# Patient Record
Sex: Female | Born: 1980
Health system: Southern US, Community
[De-identification: ages and names within clinical notes are randomized; demographics above are authoritative.]

## PROBLEM LIST (undated history)

## (undated) DIAGNOSIS — J189 Pneumonia, unspecified organism: Secondary | ICD-10-CM

## (undated) DIAGNOSIS — J8281 Chronic eosinophilic pneumonia: Secondary | ICD-10-CM

## (undated) DIAGNOSIS — R569 Unspecified convulsions: Secondary | ICD-10-CM

## (undated) DIAGNOSIS — A499 Bacterial infection, unspecified: Secondary | ICD-10-CM

## (undated) DIAGNOSIS — D049 Carcinoma in situ of skin, unspecified: Secondary | ICD-10-CM

## (undated) DIAGNOSIS — D649 Anemia, unspecified: Secondary | ICD-10-CM

## (undated) DIAGNOSIS — R0600 Dyspnea, unspecified: Secondary | ICD-10-CM

## (undated) DIAGNOSIS — T8859XA Other complications of anesthesia, initial encounter: Secondary | ICD-10-CM

## (undated) DIAGNOSIS — E039 Hypothyroidism, unspecified: Secondary | ICD-10-CM

## (undated) DIAGNOSIS — Z9889 Other specified postprocedural states: Secondary | ICD-10-CM

## (undated) DIAGNOSIS — K219 Gastro-esophageal reflux disease without esophagitis: Secondary | ICD-10-CM

## (undated) DIAGNOSIS — T4145XA Adverse effect of unspecified anesthetic, initial encounter: Secondary | ICD-10-CM

## (undated) DIAGNOSIS — Z8619 Personal history of other infectious and parasitic diseases: Secondary | ICD-10-CM

## (undated) DIAGNOSIS — Z87898 Personal history of other specified conditions: Secondary | ICD-10-CM

## (undated) DIAGNOSIS — J82 Pulmonary eosinophilia, not elsewhere classified: Principal | ICD-10-CM

## (undated) DIAGNOSIS — R51 Headache: Secondary | ICD-10-CM

## (undated) DIAGNOSIS — R112 Nausea with vomiting, unspecified: Secondary | ICD-10-CM

## (undated) DIAGNOSIS — R519 Headache, unspecified: Secondary | ICD-10-CM

## (undated) DIAGNOSIS — N390 Urinary tract infection, site not specified: Secondary | ICD-10-CM

## (undated) HISTORY — PX: TONSILLECTOMY AND ADENOIDECTOMY: SUR1326

## (undated) HISTORY — DX: Chronic eosinophilic pneumonia: J82.81

## (undated) HISTORY — DX: Hypothyroidism, unspecified: E03.9

## (undated) HISTORY — DX: Personal history of other specified conditions: Z87.898

## (undated) HISTORY — DX: Bacterial infection, unspecified: N39.0

## (undated) HISTORY — DX: Urinary tract infection, site not specified: A49.9

## (undated) HISTORY — PX: DIAGNOSTIC LAPAROSCOPY: SUR761

## (undated) HISTORY — DX: Pulmonary eosinophilia, not elsewhere classified: J82

## (undated) HISTORY — DX: Personal history of other infectious and parasitic diseases: Z86.19

## (undated) HISTORY — DX: Anemia, unspecified: D64.9

---
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
## 2006-09-24 ENCOUNTER — Inpatient Hospital Stay: Payer: Self-pay | Admitting: Obstetrics and Gynecology

## 2006-10-20 DIAGNOSIS — J189 Pneumonia, unspecified organism: Secondary | ICD-10-CM

## 2006-10-20 HISTORY — PX: LUNG BIOPSY: SHX232

## 2006-10-20 HISTORY — DX: Pneumonia, unspecified organism: J18.9

## 2007-10-26 ENCOUNTER — Ambulatory Visit: Payer: Self-pay | Admitting: Internal Medicine

## 2007-11-04 ENCOUNTER — Ambulatory Visit: Payer: Self-pay | Admitting: Internal Medicine

## 2007-11-23 ENCOUNTER — Ambulatory Visit: Payer: Self-pay | Admitting: Thoracic Surgery

## 2007-12-06 ENCOUNTER — Inpatient Hospital Stay (HOSPITAL_COMMUNITY): Admission: RE | Admit: 2007-12-06 | Discharge: 2007-12-09 | Payer: Self-pay | Admitting: Thoracic Surgery

## 2007-12-06 ENCOUNTER — Ambulatory Visit: Payer: Self-pay | Admitting: Internal Medicine

## 2007-12-06 ENCOUNTER — Ambulatory Visit: Payer: Self-pay | Admitting: Thoracic Surgery

## 2007-12-06 ENCOUNTER — Encounter: Payer: Self-pay | Admitting: Thoracic Surgery

## 2007-12-16 ENCOUNTER — Encounter: Admission: RE | Admit: 2007-12-16 | Discharge: 2007-12-16 | Payer: Self-pay | Admitting: Thoracic Surgery

## 2007-12-16 ENCOUNTER — Ambulatory Visit: Payer: Self-pay | Admitting: Thoracic Surgery

## 2008-01-12 ENCOUNTER — Ambulatory Visit: Payer: Self-pay | Admitting: Thoracic Surgery

## 2008-01-12 ENCOUNTER — Encounter: Admission: RE | Admit: 2008-01-12 | Discharge: 2008-01-12 | Payer: Self-pay | Admitting: Thoracic Surgery

## 2008-01-19 ENCOUNTER — Ambulatory Visit: Payer: Self-pay | Admitting: Internal Medicine

## 2008-04-02 IMAGING — CR DG CHEST 2V
2 series · 2 of 2 positions shown · non-contrast
Comparison: Chest, 12/16/07.

CLINICAL DATA: History of VATS on the right.  Follow-up.
 CHEST - 2 VIEW:

[w chest pa]
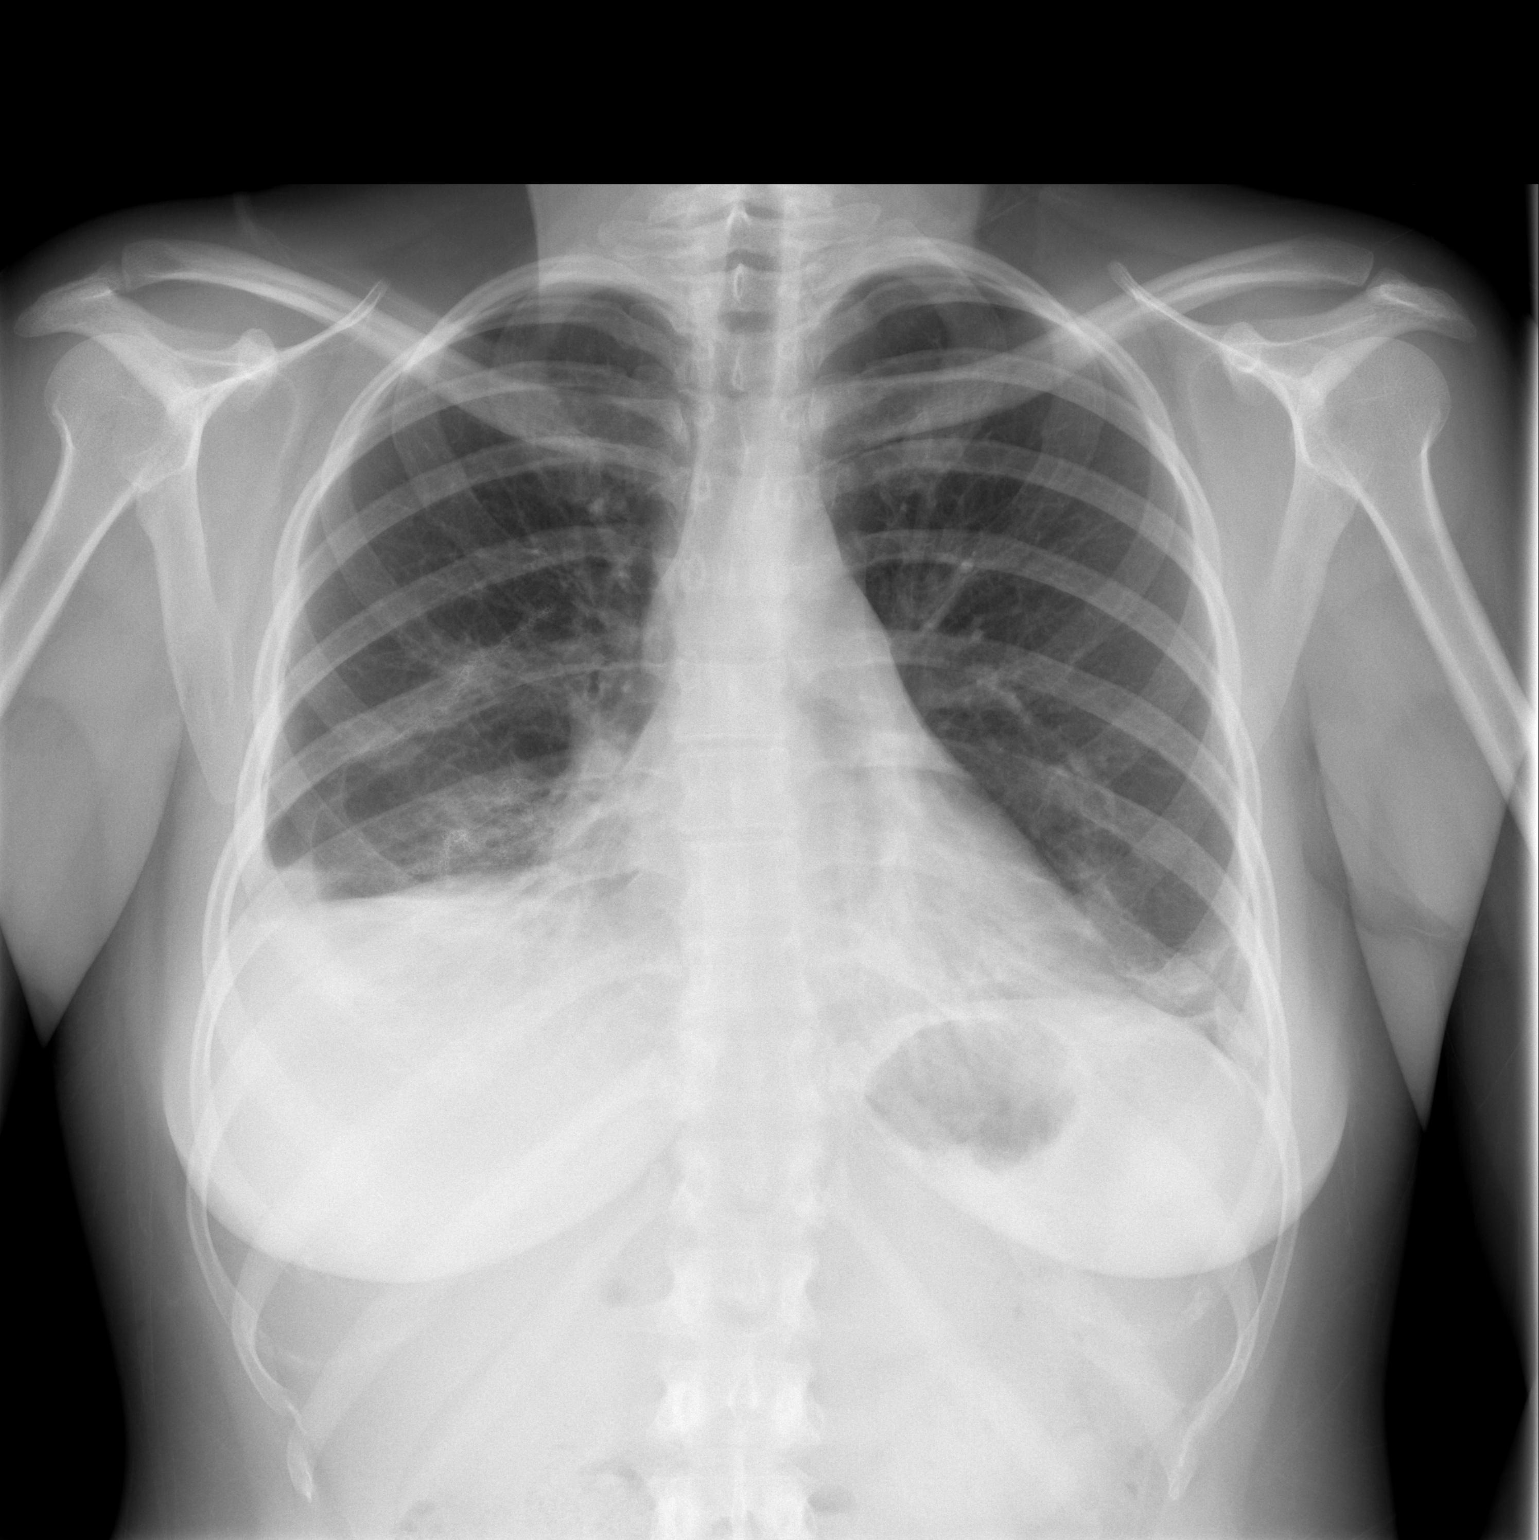

[w chest lat]
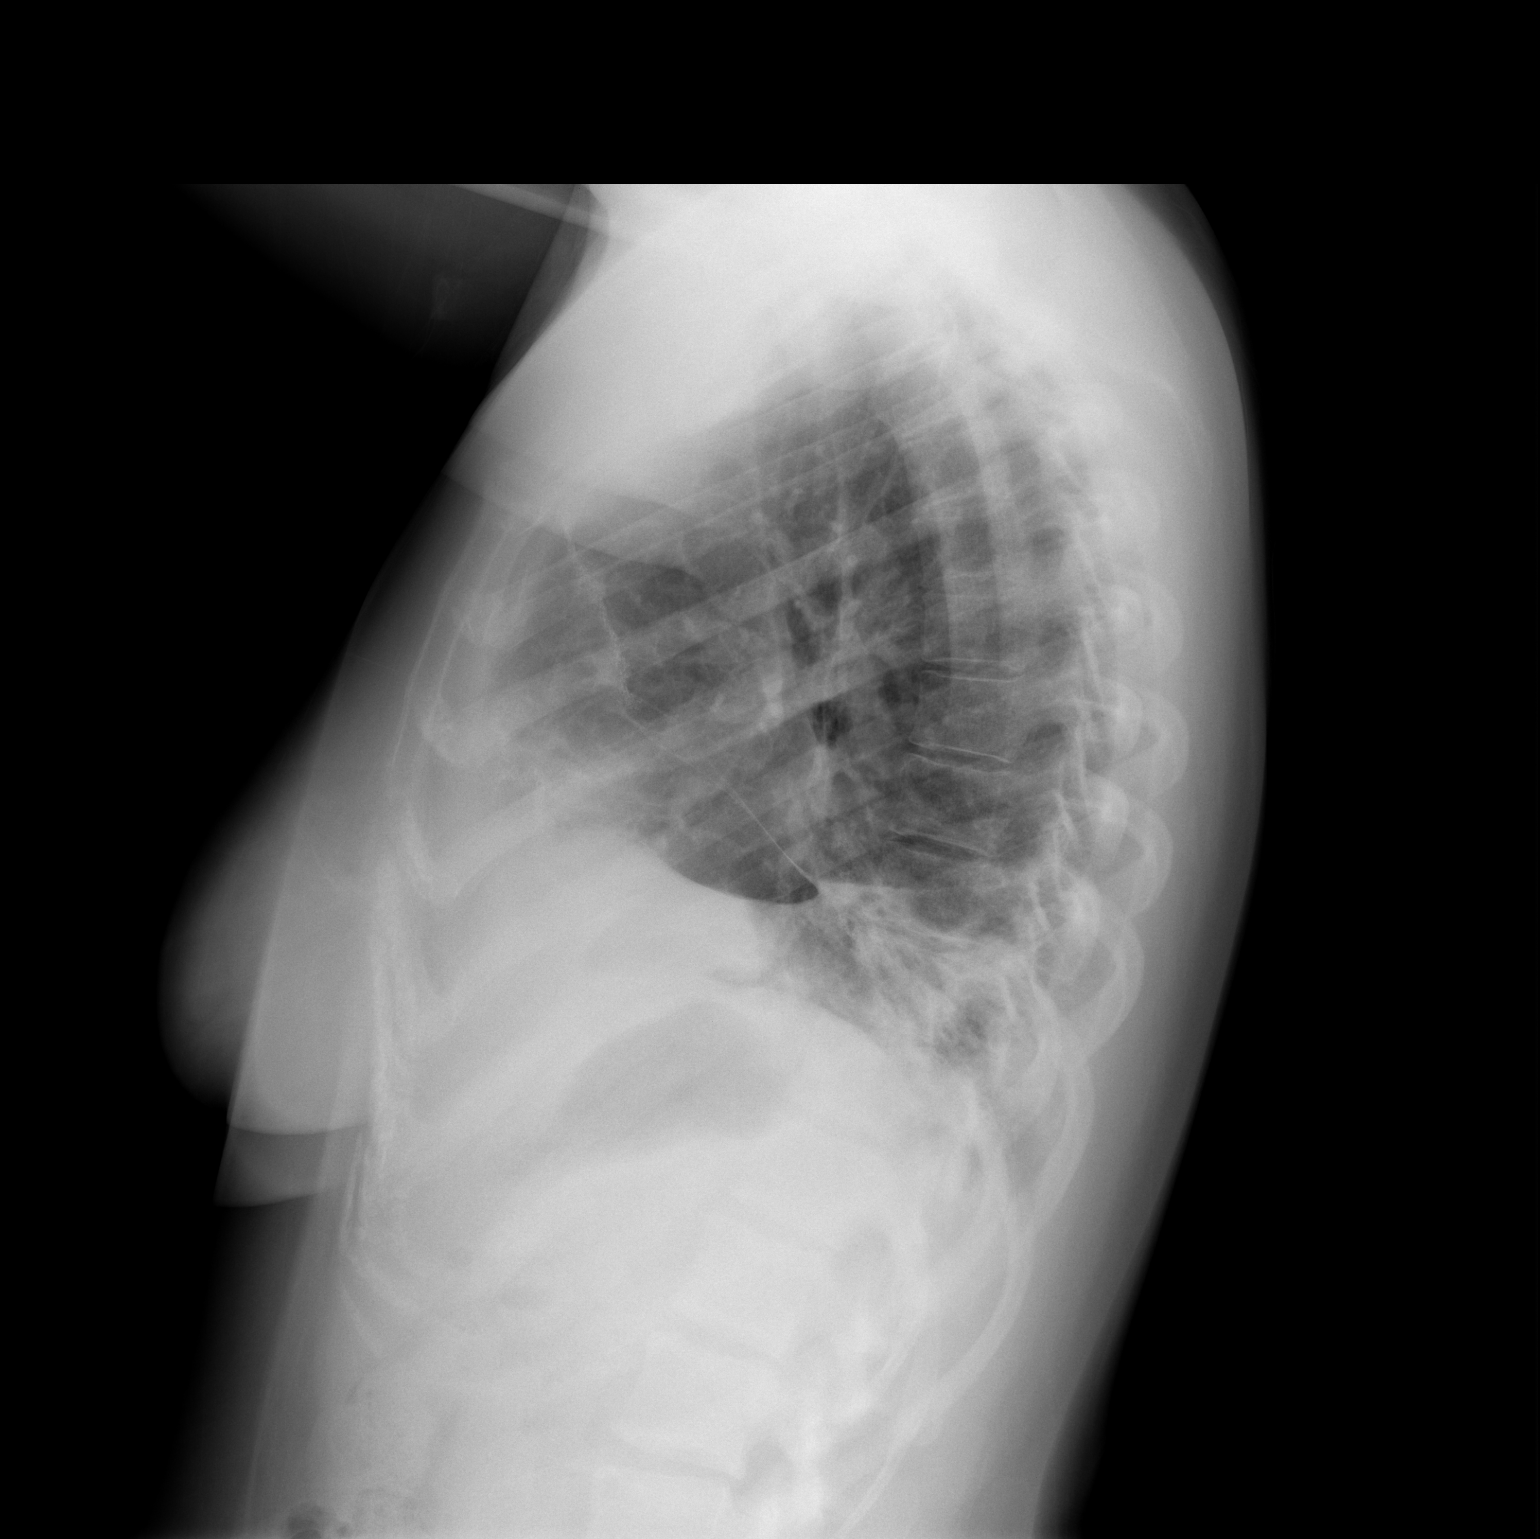

[2 of 2 positions shown; findings below may reference images not displayed]

FINDINGS: The aeration has improved.  There is persistent opacity at the lung bases, right greater than left and a probable small right effusion.  Heart size is stable.
IMPRESSION: Improved aeration.  Persistent basilar opacities, right greater than left with probable small right effusion.

## 2008-04-09 IMAGING — CT CT CHEST W/ CM
1 series · 15 of 32 positions shown, 19 images · non-contrast
Comparison: none

REASON FOR EXAM: Follow-up pneumonia
COMMENTS:

[Series 2: soft tissue · axial · 0.66mm/px · z∈[-103,+147]mm · 15 of 56 slices shown, 19 images]
[im 4/56  soft-tissue]
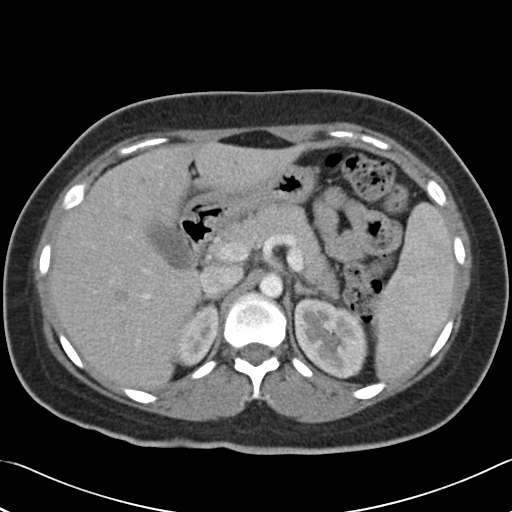
[im 4/56  bone]
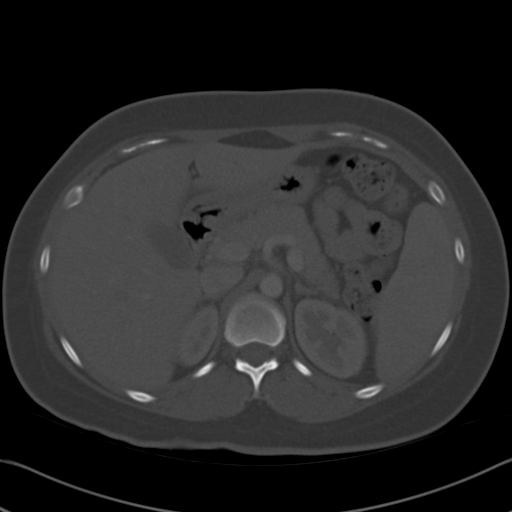
[im 8/56  soft-tissue]
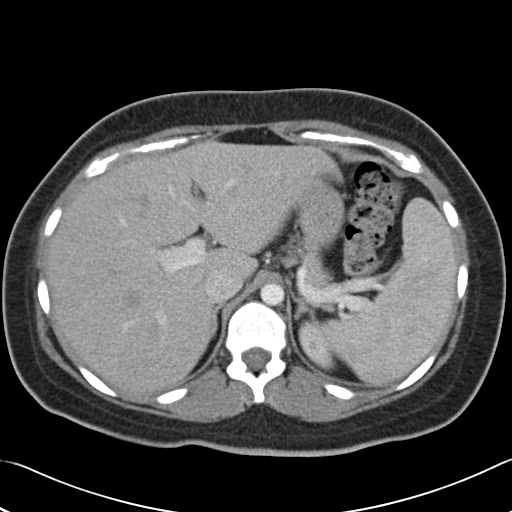
[im 11/56  soft-tissue]
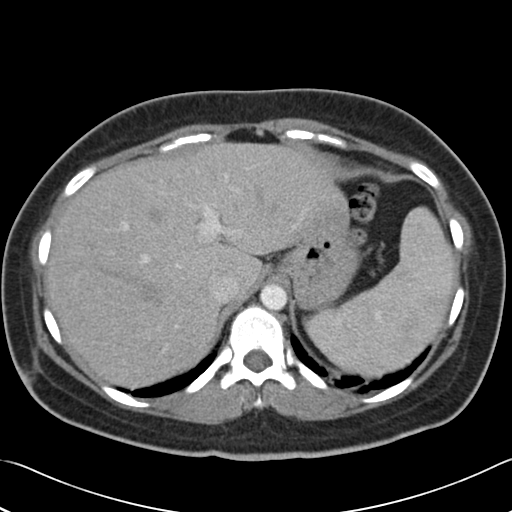
[im 16/56  soft-tissue]
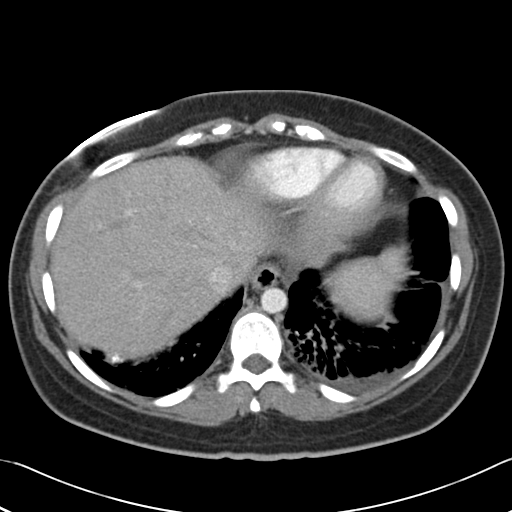
[im 20/56  soft-tissue]
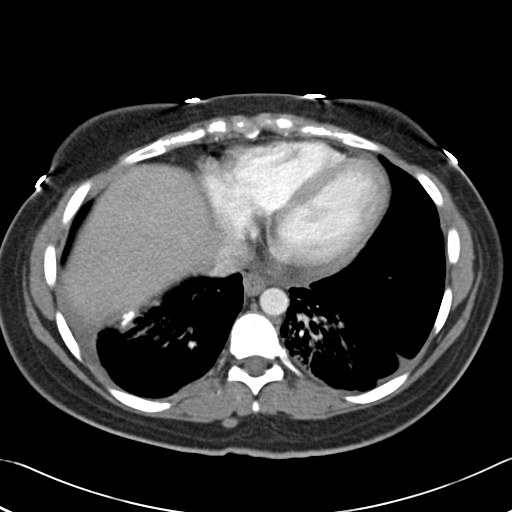
[im 24/56  soft-tissue]
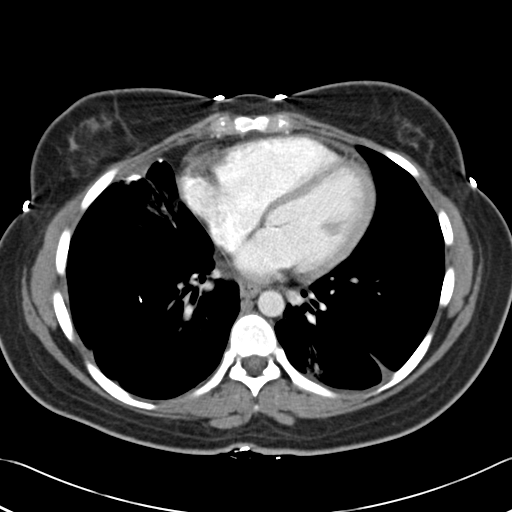
[im 29/56  soft-tissue]
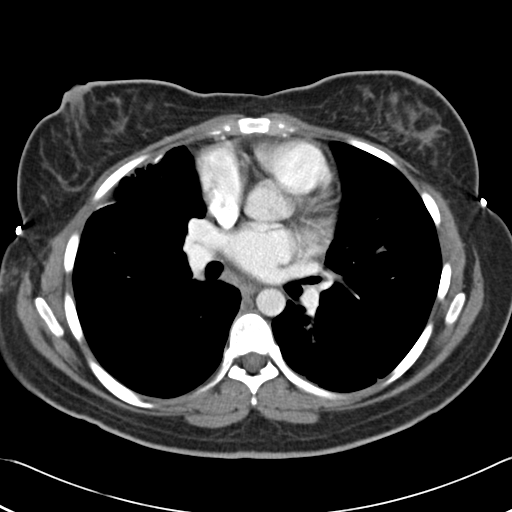
[im 32/56  soft-tissue]
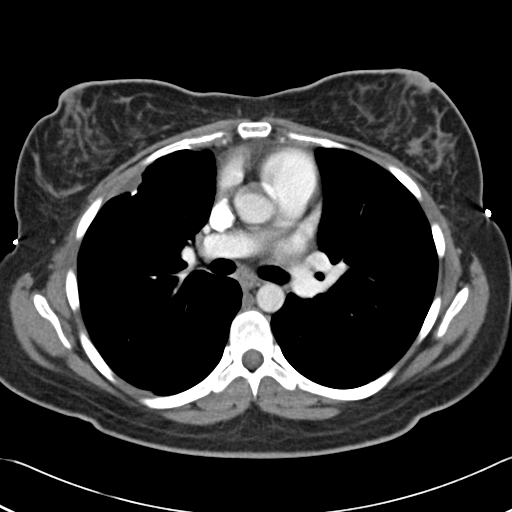
[im 36/56  soft-tissue]
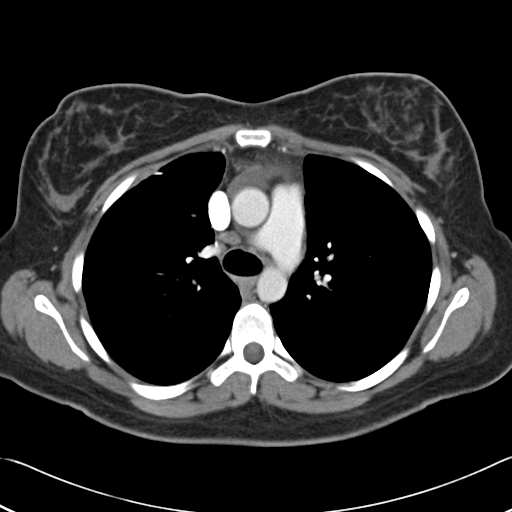
[im 36/56  bone]
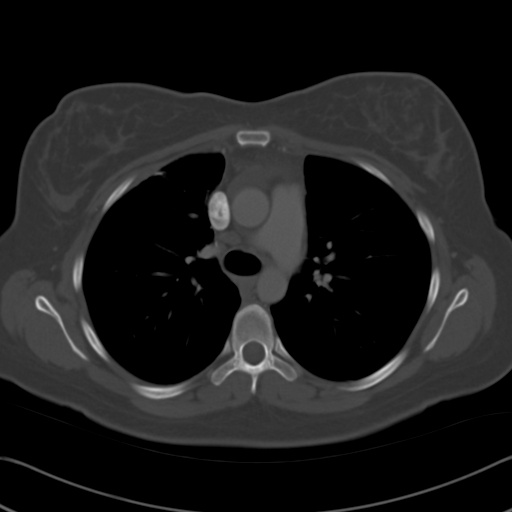
[im 40/56  soft-tissue]
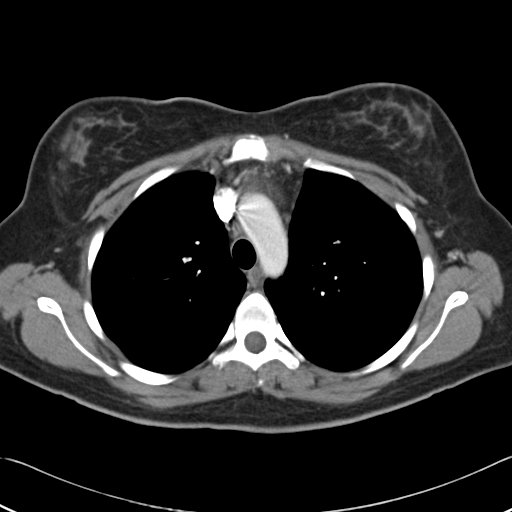
[im 45/56  soft-tissue]
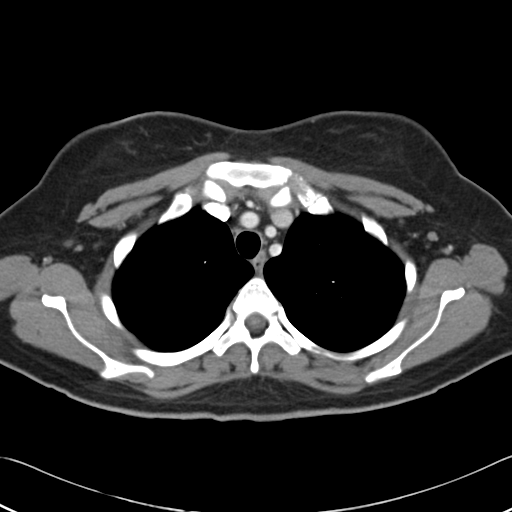
[im 48/56  soft-tissue]
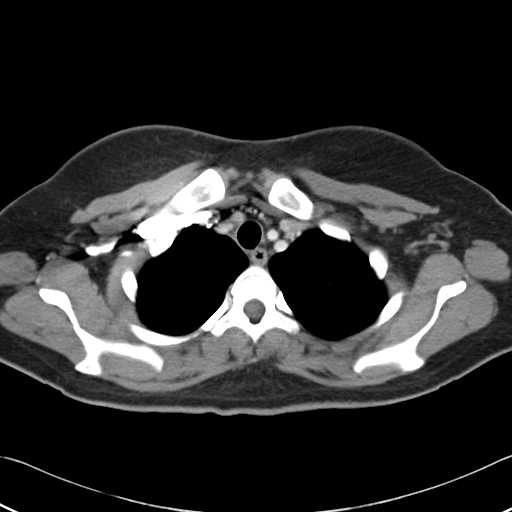
[im 48/56  lung]
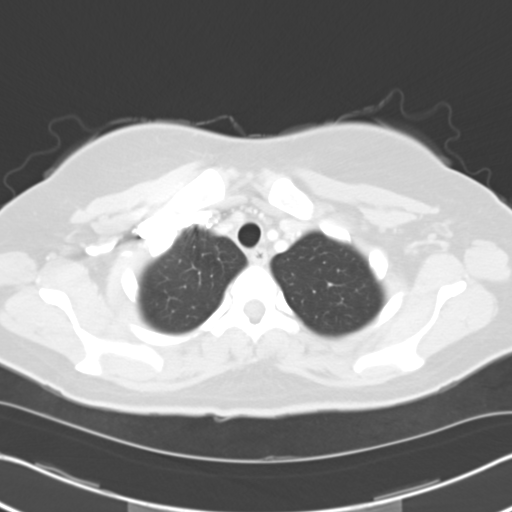
[im 50/56  lung]
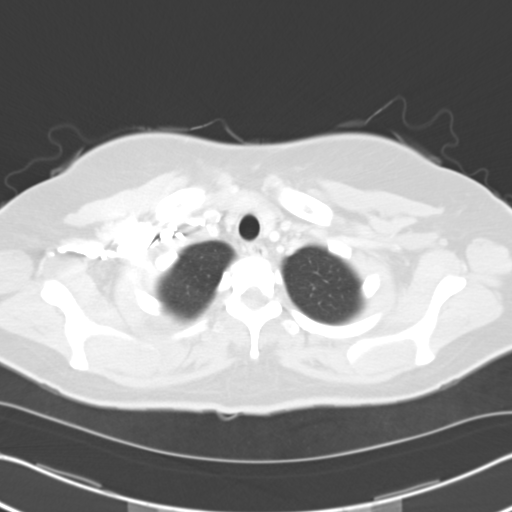
[im 52/56  soft-tissue]
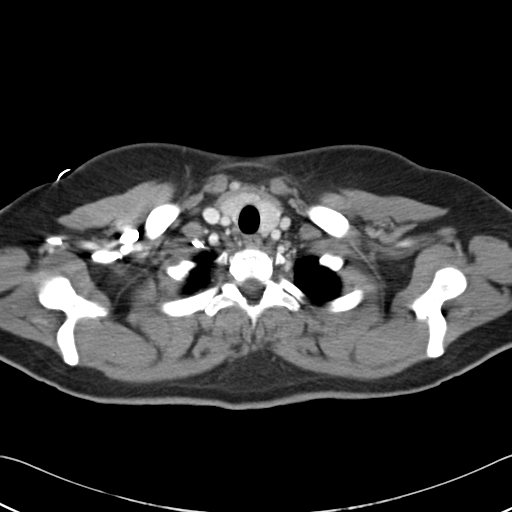
[im 52/56  lung]
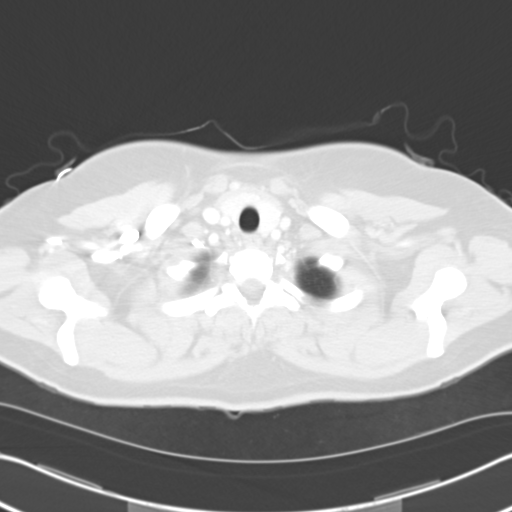
[im 54/56  lung]
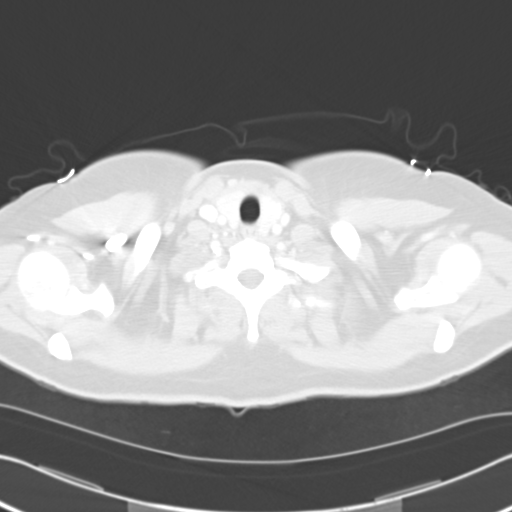

[15 of 32 positions shown; findings below may reference images not displayed]

PROCEDURE:     CT  - CT CHEST WITH CONTRAST  - January 19, 2008  [DATE]

RESULT:     The patient has a history of prior pneumonia.

Comparison is made to a study of 10/26/2007.

There has been some improvement in the appearance of the lower lobe both
anteriorly and posteriorly. While coarse lung markings remain in this
region, the confluent alveolar infiltrate with air bronchograms, seen on the
prior study, have cleared. Similar improvement has occurred on the LEFT.
There remain areas of abnormality which are likely at least in part
reflective of bronchiectasis. Density in the more superior and anterior
aspects of the RIGHT middle lobe has improved as well. I see no new
infiltrates.

The cardiac chambers are normal in size. There is no more than a tiny amount
of pleural fluid versus pleural thickening at the LEFT lung base. This is
not greatly changed from the prior study. Within the mediastinum and hilar
regions, I see no pathologic sized lymph nodes. The caliber of the thoracic
aorta is normal. Within the upper abdomen, the observed portions of the
liver are normal. There is radiolucency in the gallbladder which may reflect
a gas filled stone. The adrenal glands exhibit no abnormality.
IMPRESSION: 1.  There has been improvement in the appearance of both lungs but there
remain significant areas of architectural distortion. This may reflect
fibrosis and bronchiectasis. There is some pleural calcification noted along
the anterior/superior aspect of the RIGHT middle lobe and in the RIGHT lower
lobe. These have appeared since the prior study. I see no evidence of an
active alveolar infiltrate.
2.  There is no evidence of CHF and I see no pathologic sized lymph nodes.
3.  I cannot exclude gallstones.

## 2008-04-25 ENCOUNTER — Ambulatory Visit: Payer: Self-pay | Admitting: Internal Medicine

## 2009-02-09 ENCOUNTER — Ambulatory Visit: Payer: Self-pay | Admitting: Internal Medicine

## 2009-04-18 ENCOUNTER — Ambulatory Visit: Payer: Self-pay | Admitting: Rheumatology

## 2009-05-22 ENCOUNTER — Encounter: Payer: Self-pay | Admitting: Obstetrics & Gynecology

## 2009-05-22 ENCOUNTER — Ambulatory Visit: Payer: Self-pay | Admitting: Obstetrics & Gynecology

## 2009-05-22 LAB — CONVERTED CEMR LAB
Hemoglobin: 11.9 g/dL — ABNORMAL LOW (ref 12.0–15.0)
Hepatitis B Surface Ag: NEGATIVE
Lymphs Abs: 1.6 10*3/uL (ref 0.7–4.0)
MCHC: 31.2 g/dL (ref 30.0–36.0)
MCV: 84.9 fL (ref 78.0–100.0)
Neutro Abs: 4 10*3/uL (ref 1.7–7.7)
Platelets: 258 10*3/uL (ref 150–400)
RBC: 4.49 M/uL (ref 3.87–5.11)
Rh Type: NEGATIVE
Rubella: 9.6 intl units/mL — ABNORMAL HIGH

## 2009-05-29 ENCOUNTER — Ambulatory Visit (HOSPITAL_COMMUNITY): Admission: RE | Admit: 2009-05-29 | Discharge: 2009-05-29 | Payer: Self-pay | Admitting: Obstetrics & Gynecology

## 2009-06-12 ENCOUNTER — Ambulatory Visit: Payer: Self-pay | Admitting: Family Medicine

## 2009-06-12 ENCOUNTER — Encounter: Payer: Self-pay | Admitting: Obstetrics & Gynecology

## 2009-07-03 ENCOUNTER — Ambulatory Visit (HOSPITAL_COMMUNITY): Admission: RE | Admit: 2009-07-03 | Discharge: 2009-07-03 | Payer: Self-pay | Admitting: Obstetrics & Gynecology

## 2009-07-17 ENCOUNTER — Ambulatory Visit: Payer: Self-pay | Admitting: Obstetrics & Gynecology

## 2009-07-18 ENCOUNTER — Encounter: Payer: Self-pay | Admitting: Obstetrics & Gynecology

## 2009-07-18 LAB — CONVERTED CEMR LAB: TSH: 4.202 microintl units/mL (ref 0.350–4.500)

## 2009-08-14 ENCOUNTER — Ambulatory Visit (HOSPITAL_COMMUNITY): Admission: RE | Admit: 2009-08-14 | Discharge: 2009-08-14 | Payer: Self-pay | Admitting: Obstetrics & Gynecology

## 2009-08-16 ENCOUNTER — Encounter: Payer: Self-pay | Admitting: Obstetrics and Gynecology

## 2009-08-16 ENCOUNTER — Ambulatory Visit: Payer: Self-pay | Admitting: Obstetrics & Gynecology

## 2009-09-11 ENCOUNTER — Ambulatory Visit: Payer: Self-pay | Admitting: Obstetrics & Gynecology

## 2009-10-04 ENCOUNTER — Ambulatory Visit (HOSPITAL_COMMUNITY): Admission: RE | Admit: 2009-10-04 | Discharge: 2009-10-04 | Payer: Self-pay | Admitting: Obstetrics & Gynecology

## 2009-10-11 ENCOUNTER — Ambulatory Visit: Payer: Self-pay | Admitting: Family Medicine

## 2009-10-11 LAB — CONVERTED CEMR LAB
Hemoglobin: 12.1 g/dL (ref 12.0–15.0)
Platelets: 189 10*3/uL (ref 150–400)

## 2009-11-01 ENCOUNTER — Encounter: Payer: Self-pay | Admitting: Obstetrics & Gynecology

## 2009-11-01 ENCOUNTER — Ambulatory Visit: Payer: Self-pay | Admitting: Obstetrics & Gynecology

## 2009-11-01 LAB — CONVERTED CEMR LAB
Antibody Screen: NEGATIVE
Free T4: 1.04 ng/dL (ref 0.80–1.80)
T3, Free: 2.6 pg/mL (ref 2.3–4.2)

## 2009-11-22 ENCOUNTER — Ambulatory Visit: Payer: Self-pay | Admitting: Obstetrics & Gynecology

## 2009-12-05 ENCOUNTER — Ambulatory Visit: Payer: Self-pay | Admitting: Family Medicine

## 2009-12-20 ENCOUNTER — Ambulatory Visit: Payer: Self-pay | Admitting: Family Medicine

## 2009-12-20 LAB — CONVERTED CEMR LAB: GC Probe Amp, Genital: NEGATIVE

## 2009-12-27 ENCOUNTER — Ambulatory Visit: Payer: Self-pay | Admitting: Family Medicine

## 2010-01-02 ENCOUNTER — Inpatient Hospital Stay (HOSPITAL_COMMUNITY): Admission: AD | Admit: 2010-01-02 | Discharge: 2010-01-02 | Payer: Self-pay | Admitting: Obstetrics & Gynecology

## 2010-01-02 ENCOUNTER — Ambulatory Visit: Payer: Self-pay | Admitting: Advanced Practice Midwife

## 2010-01-03 ENCOUNTER — Ambulatory Visit: Payer: Self-pay | Admitting: Obstetrics & Gynecology

## 2010-01-07 ENCOUNTER — Ambulatory Visit (HOSPITAL_COMMUNITY): Admission: RE | Admit: 2010-01-07 | Discharge: 2010-01-07 | Payer: Self-pay | Admitting: Obstetrics & Gynecology

## 2010-01-09 ENCOUNTER — Ambulatory Visit: Payer: Self-pay | Admitting: Obstetrics and Gynecology

## 2010-01-17 ENCOUNTER — Ambulatory Visit: Payer: Self-pay | Admitting: Obstetrics & Gynecology

## 2010-01-21 ENCOUNTER — Ambulatory Visit (HOSPITAL_COMMUNITY): Admission: RE | Admit: 2010-01-21 | Discharge: 2010-01-21 | Payer: Self-pay | Admitting: Obstetrics & Gynecology

## 2010-01-21 ENCOUNTER — Ambulatory Visit: Payer: Self-pay | Admitting: Obstetrics and Gynecology

## 2010-01-23 ENCOUNTER — Ambulatory Visit: Payer: Self-pay | Admitting: Advanced Practice Midwife

## 2010-01-23 ENCOUNTER — Inpatient Hospital Stay (HOSPITAL_COMMUNITY): Admission: RE | Admit: 2010-01-23 | Discharge: 2010-01-25 | Payer: Self-pay | Admitting: Obstetrics & Gynecology

## 2010-03-06 ENCOUNTER — Ambulatory Visit: Payer: Self-pay | Admitting: Obstetrics & Gynecology

## 2010-03-06 LAB — CONVERTED CEMR LAB: TSH: 0.026 microintl units/mL — ABNORMAL LOW (ref 0.350–4.500)

## 2010-07-04 ENCOUNTER — Encounter: Payer: Self-pay | Admitting: Obstetrics & Gynecology

## 2010-07-04 ENCOUNTER — Ambulatory Visit: Payer: Self-pay | Admitting: Obstetrics & Gynecology

## 2010-07-04 LAB — CONVERTED CEMR LAB: TSH: 7.016 microintl units/mL — ABNORMAL HIGH (ref 0.350–4.500)

## 2010-07-05 ENCOUNTER — Encounter: Payer: Self-pay | Admitting: Obstetrics & Gynecology

## 2010-07-05 LAB — CONVERTED CEMR LAB
Clue Cells Wet Prep HPF POC: NONE SEEN
Trich, Wet Prep: NONE SEEN
WBC, Wet Prep HPF POC: NONE SEEN
Yeast Wet Prep HPF POC: NONE SEEN

## 2010-12-27 ENCOUNTER — Encounter: Payer: Self-pay | Admitting: Obstetrics & Gynecology

## 2011-01-08 ENCOUNTER — Other Ambulatory Visit: Payer: Self-pay

## 2011-01-08 DIAGNOSIS — E039 Hypothyroidism, unspecified: Secondary | ICD-10-CM

## 2011-01-08 LAB — CBC
HCT: 33.4 % — ABNORMAL LOW (ref 36.0–46.0)
HCT: 38.2 % (ref 36.0–46.0)
Hemoglobin: 11.4 g/dL — ABNORMAL LOW (ref 12.0–15.0)
Hemoglobin: 13.2 g/dL (ref 12.0–15.0)
MCHC: 34.1 g/dL (ref 30.0–36.0)
MCHC: 34.5 g/dL (ref 30.0–36.0)
MCV: 89.6 fL (ref 78.0–100.0)
MCV: 91.1 fL (ref 78.0–100.0)
Platelets: 168 10*3/uL (ref 150–400)
RBC: 4.26 MIL/uL (ref 3.87–5.11)
RDW: 14.2 % (ref 11.5–15.5)

## 2011-01-08 LAB — RPR: RPR Ser Ql: NONREACTIVE

## 2011-02-07 ENCOUNTER — Ambulatory Visit (INDEPENDENT_AMBULATORY_CARE_PROVIDER_SITE_OTHER): Payer: Managed Care, Other (non HMO) | Admitting: Family Medicine

## 2011-02-07 ENCOUNTER — Encounter: Payer: Self-pay | Admitting: Family Medicine

## 2011-02-07 DIAGNOSIS — E039 Hypothyroidism, unspecified: Secondary | ICD-10-CM | POA: Insufficient documentation

## 2011-02-07 DIAGNOSIS — R21 Rash and other nonspecific skin eruption: Secondary | ICD-10-CM

## 2011-02-07 MED ORDER — TRIAMCINOLONE ACETONIDE 0.1 % EX CREA: TOPICAL_CREAM | Freq: Two times a day (BID) | CUTANEOUS | Status: DC

## 2011-02-07 NOTE — Patient Instructions (Signed)
Use the triamcinolone on the affected areas twice a day.  Let me know if you aren't getting better.  Take care.

## 2011-02-07 NOTE — Progress Notes (Signed)
New pt to ext care.    Now with skin changes in axilla and chest wall. Started during pregnancy, ~1 year ago.  Now slightly more irritated.  No relief with otc meds. Doesn't itch or hurt.  She has dry skin at baseline.    No FCNAV.  No sweats, not sob.     PMH and SH reviewed  ROS: See HPI, otherwise noncontributory.  Meds, vitals, and allergies reviewed.   GEN: nad, alert and oriented HEENT: mucous membranes moist NECK: supple w/o LA CV: rrr.  no murmur PULM: ctab, no inc wob ABD: soft, +bs EXT: no edema SKIN: no acute rash but several tan colored, slightly irritated but not ulcerated and nonblanching oval lesion several cm across in R axilla, R chest wall and L chest wall.

## 2011-02-07 NOTE — Assessment & Plan Note (Signed)
She has dry skin. Is using mild soap.  D/w pt about limiting hot showers and pat-drying.  For lesions, she can use TAC and see if this helps.  If not, we could consider punch biopsy.  She'll call back as needed.

## 2011-02-09 ENCOUNTER — Encounter: Payer: Self-pay | Admitting: Family Medicine

## 2011-02-09 NOTE — Assessment & Plan Note (Signed)
Requesting records.   

## 2011-03-04 NOTE — Consult Note (Signed)
[name], [name]           ACCOUNT NO.:  [id number]   MEDICAL RECORD NO.:  [id number]          PATIENT TYPE:  INP   LOCATION:  2550                         FACILITY:  [facility]   PHYSICIAN:  Kalman Shan, MD   DATE OF BIRTH:  [birth year]   DATE OF CONSULTATION:  12/06/2007  DATE OF DISCHARGE:                                 CONSULTATION   REFERRING PHYSICIAN:  Ines [name], M.D.   REASON FOR CONSULTATION:  Bilateral diffuse parenchymal lung disease,  status post VATS biopsy.   HISTORY OF PRESENT ILLNESS:  [name] is a pleasant, 30-year-  old young woman referred from [facility] to Dr. Edwyna Shell for VATS  biopsy.  She is currently in the PACU and is recovering from anesthesia.  However, she is sleepy, but easily arousable and conversing.  According  to her history, somewhere beginning November 2008, she began to notice  shortness of breath and early morning dry cough.  Her symptoms have been  progressive since then.  Current effort tolerance is dyspnea Class II-  III.  She says she is hardly able to do anything, and gets short of  breath walking a few steps or feet.  A chest x-ray was done in January  2009, which showed bilateral lower lobe and middle lobe consolidation.  CT scan was done at [facility].  I do not have the CT results.  A PFT was  also done and I do not have these results with me at this moment.  However, CT scan apparently shows bilateral consolidation in the lower  lobes and in the middle lobe.   She was then referred to Dr. Edwyna Shell for biopsy.  She underwent VATS  biopsy earlier today and is currently in the PACU recovering.  There  were no complications from the surgery from the PACU nurse, other than  the fact that when she was brought to the PACU she was 0/5, but then  recovered and has since been extubated.  She is currently on 5 L nasal  cannula.   In reviewing history with her, there is no prior pneumonia, no prior  fever, chills,  colored sputum or any other symptoms.   PAST MEDICAL HISTORY:  Alert, healthy female with no medical problems.   SOCIAL HISTORY:  She has a 57-year-old child and quit smoking 2 years  ago.  She smoked 1/2 pack of cigarettes x2 years.   REVIEW OF SYSTEMS:  CONSTITUTIONAL:  Weight 172 pounds, height 5 feet 2  inches.  CARDIOVASCULAR:  No angina or chest pain.  PULMONARY:  Shortness of breath lying flat and wheezing with cough.  GASTROINTESTINAL:  No nausea, vomiting, constipation.  GENITOURINARY:  No dysuria or frequency.  VASCULAR:  No claudication.  NEUROLOGIC:  No  headaches, blackout or seizures.  MUSCULOSKELETAL:  No joint pain and no  psychiatric illness.  HEENT:  No changes in eye sight or hearing.  HEMATOLOGIC:  No edema or clotting disorder.   FAMILY HISTORY:  Negative for cancer, cardiac disease or pulmonary  hypertension.   SOCIAL HISTORY:  No occupational exposures.  Not exposed to any form of  pollution  or asbestos.   CURRENT MEDICATIONS:  Fentanyl, hydromorphone, Zofran and Compazine.  All these are postop medications.   ALLERGIES:  No known drug allergies.   PHYSICAL EXAMINATION:  VITAL SIGNS:  Currently in the PACU, afebrile,  heart rate 66, blood pressure map of 66, pulse oximetry 100% on 5 L  nasal cannula, respirations 26.  CENTRAL NERVOUS SYSTEM:  Alert and oriented x3.  Sleepy, but arousable.  Glasgow coma scale is 15.  Moves all fours.  Complains of pain.  NECK:  No neck nodes.  No elevated JVP.  Right suprascapular approach  right subclavian line present.  HEENT:  Normal.  CARDIAC:  Normal heart sounds, regular rate and rhythm.  No murmurs.  LUNGS:  Bilateral crackles.  CHEST:  Right lateral side is bandaged with a chest tube present.  ABDOMEN:  Soft, no mass, nontender.  EXTREMITIES:  No clubbing, cyanosis or edema.  SKIN:  Intact, no rashes.  MUSCULOSKELETAL:  No joint deformities.   LABORATORY DATA AND X-RAY FINDINGS:  Chest x-ray on December 06, 2007,  done at [facility] with bibasilar air space disease.  No prior chest x-  ray available for my comparison.  I personally reviewed the chest x-ray  done at [facility].   Pathology of the right VATS biopsy is pending.  This has only been sent  today.   ASSESSMENT:  This is a 30 year old, healthy, very limited smoker with 3-  to 61-month history of progressive dyspnea with early morning cough and  wheezing.  Symptoms are severe enough to Class III-IV dyspnea.  Evaluation shows bilateral bibasilar consolidation consistent with  diffuse parenchymal disease of unknown etiology.  Video assisted  thoracic surgery biopsy has been done today.  Her postoperative course  has been uneventful so far.   RECOMMENDATIONS:  1. This is a very interesting case.  Will await biopsy reports.      Depending on biopsy reports, decide on future treatment.  2. 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   40 minute inpatient consult      Kalman Shan, MD  Electronically Signed     MR/MEDQ  D:  12/06/2007  T:  12/06/2007  Job:  [id number] [id number]

## 2011-03-04 NOTE — Letter (Signed)
January 12, 2008   Freda Munro, M.D.  [address]  [place], [place] [address]   Re:  ANISE, HARBIN                 DOB:  Mar 10, 1981   Dear Dr. Welton Flakes:   I saw Mrs. Curtice back today and her chest x-ray is stable.  There is  actually decrease in her inflammation.  Incisions are well healed.  Her  blood pressure was 100/70, pulse 66, respirations 18.  Saturations were  99%.  She is doing well.  I will be happy to see her again if she has  any future problems.   Ines Bloomer, M.D.  Electronically Signed   DPB/MEDQ  D:  01/12/2008  T:  01/12/2008  Job:  [id number]

## 2011-03-04 NOTE — Letter (Signed)
November 23, 2007   Freda Munro  [address]  [place], [place] [address]   Re:  HINATA, DIENER                   DOB:  [birth year]   Dear Dr. Welton Flakes:   I appreciate the opportunity of caring for this patient.  She was  admitted because of increasing shortness of breath.  In November, she  had increasing shortness of breath and was seen by her medical doctor  and a chest x-ray was obtained, and then had a CT scan done January 6,  which showed marked abnormalities in both lobes, and the right middle  lobe with changes in the upper lobe consistent with interstitial  pneumonia.  There was mild mediastinal abnormality.  She has had  increasing shortness of breath with exertion.  She does not smoke.  Quit  smoking 2 years ago, and has smoked 1/2 pack of cigarettes for 2 years.  She has had no hemoptysis.  No fevers or chills.  No excessive sputum.  She underwent pulmonary function tests, which revealed FVC of 1.24 with  an FEV1 of 0.91, and a diffusion capacity of 34%.  It was consistent  with severe restrictive lung disease and severe reduced DLCO.  Her past  medical history is positive for migraines.  Her medications include  Levaquin and Flovent 100 mcg daily.  She has no allergies.   FAMILY HISTORY:  Negative for cancer and cardiac disease.   SOCIAL HISTORY:  She has a 15-year-old child.  Quit smoking 2 years ago.  Occasional alcohol.   REVIEW OF SYSTEMS:  She is 172 pounds.  She is 5 feet 2 inches.  CARDIAC:  No angina or atrial fibrillation.  PULMONARY:  She gets short of breath with exertion, lying flat, and  wheezing.  GASTROINTESTINAL:  No nausea or vomiting, constipation, or diarrhea.  No  GERD.  GENITOURINARY:  No dysuria or frequency with urination.  VASCULAR:  No claudication, DVT, or TIA.  NEUROLOGIC:  No headaches, black outs, or seizures.  MUSCULOSKELETAL:  No joint pain.  No psychiatric illness.  HEENT:  No change in her eyesight or hearing.  HEMATOLOGICAL:  No edema or clotting disorders.   PHYSICAL EXAM:  She is a well-developed Caucasian female in no acute  distress.  Her blood pressure is 115/74, pulse 92, respirations 18, sats  98%.  Head, eyes, ears, nose, and throat unremarkable.  Neck is supple  without thyromegaly.  No supraclavicular or axillary adenopathy.  Chest  is clear to auscultation and percussion.  Heart normal sinus rhythm.  No  murmur.  Abdomen is soft.  No hepatosplenomegaly.  Pulses 2+.  No  clubbing or edema. Neurological, she is alert and oriented x3.  Sensory  and motor intact.  Cranial nerves intact.   I feel that the patient has severe problem with the lungs and would  benefit from a lung biopsy.  We plan to do a right VATS lung biopsy on  the 11th at [facility].  Appreciate the opportunity of seeing her.   Ines Bloomer, M.D.  Electronically Signed   DPB/MEDQ  D:  11/23/2007  T:  11/24/2007  Job:  [id number]   cc:   [place]

## 2011-03-04 NOTE — Assessment & Plan Note (Signed)
[name], [name]           ACCOUNT NO.:  [id number]   MEDICAL RECORD NO.:  [id number]          PATIENT TYPE:  POB   LOCATION:  [facility]         FACILITY:  [facility]   PHYSICIAN:  Scheryl Darter, MD       DATE OF BIRTH:  08-15-1981   DATE OF SERVICE:                                  CLINIC NOTE   CHIEF COMPLAINT:  Pap smear and vaginal irritation.   The patient is a 30 year old white female gravida 2, para 2-0-0-2, last  menstrual period was prior to her last pregnancy.  She is currently  nursing.  Infant is 86-month old.  The patient has noted some irritation  in spite of her perineal laceration repair and she has been avoiding  intercourse.  She also has noticed a malodorous vaginal discharge.  She  also notes some cramping on her abdomen.  She is still nursing almost  exclusively, although the infant  is now receiving some solid food.  She  has not menstruated since delivery.   PAST MEDICAL HISTORY:  Hypothyroidism.   MEDICATIONS:  Levothyroxine 75 mcg a day.   ALLERGIES:  No known drug allergies.   SOCIAL HISTORY:  She is married and denies alcohol, tobacco, or drug  use.   PAST SURGICAL HISTORY:  Cesarean section in December 2007.   FAMILY HISTORY:  Throat cancer in her father.   REVIEW OF SYSTEMS:  As noted above.   PHYSICAL EXAMINATION:  GENERAL:  The patient in no acute distress.  VITAL SIGNS:  Weight is 179 pounds, height 5 feet 3 inches, BP 98/63,  pulse 60.  CHEST:  Clear.  HEART:  Regular rate and rhythm.  BREASTS:  Symmetric.  No mass or tenderness.  ABDOMEN:  Soft, nontender, no mass.  EXTREMITIES:  No swelling.  PELVIC:  External genitalia.  Vagina and cervix notable for skin tag at  the perineal body at the site of her lacerations.  She has noted some  bleeding from that area about 2 days ago.  There is no abnormal  discharge, but the vaginal mucosa appears to be somewhat atrophic.  Cervix is normal.  Uterus normal size, nontender, no mass.   Pap smear  was obtained.  Silver nitrate was used to cauterize the skin tags at the  perineum which she tolerated well.   IMPRESSION:  1. Vaginal and perineal irritation with some hypertrophic skin tags      that were cauterized today.  2. History of hypothyroidism.  Her Synthroid dose was changed when her      last TSH level was checked.   PLAN:  She will notify us if her symptoms do not improve.  A wet prep  was obtained today.  I ordered a TSH level.  She will be notified of the  result.      Scheryl Darter, MD     JA/MEDQ  D:  07/04/2010  T:  07/04/2010  Job:  [id number]

## 2011-03-04 NOTE — Op Note (Signed)
[name], [name]           ACCOUNT NO.:  [id number]   MEDICAL RECORD NO.:  [id number]          PATIENT TYPE:  INP   LOCATION:  2899                         FACILITY:  [facility]   PHYSICIAN:  Ines Bloomer, M.D. DATE OF BIRTH:  [birth year]   DATE OF PROCEDURE:  12/06/2007  DATE OF DISCHARGE:                               OPERATIVE REPORT   PREOPERATIVE DIAGNOSIS:  Bilateral right lower lobe, left lower lobe and  right middle lobe infiltrates.   POSTOPERATIVE DIAGNOSIS:  Bilateral right lower lobe, left lower lobe  and right middle lobe infiltrates.   OPERATION PERFORMED:  1. Right video-assisted thoracic surgery.  2. Lung biopsy x3.   SURGEON:  D.P. Edwyna Shell, MD   FIRST ASSISTANT:  Coral Ceo, P.A.-C   ANESTHESIA:  General anesthesia.   DESCRIPTION OF PROCEDURE:  After percutaneous insertion of all  monitoring lines, the patient underwent general anesthesia and was  turned to the right lateral thoracotomy position.  A dual-lumen tube was  inserted.  She was prepped and draped in the usual sterile manner.  Three trocar sites were made, one in the 7th intercostal space at the  anterior axillary line, on in the 8th intercostal space at the posterior  axillary line and one in the 5th intercostal space at the midaxillary  line.  Three trocars were inserted.  A 0-degree scope was inserted.  Pictures were taken of a markedly abnormal right lower lobe and right  middle lobe and increased erythema in the right upper lobe.  There was  also evidence of a pleuritis.  Grasping the medial basilar segment of  the right lower lobe with a ring forceps brought in from the midaxillary  line and trocar, a 45 green Covidien stapler was inserted from the  posterior trocar site and with several applications, we resected the  right middle lobe and then we had to enlarge the midaxillary line  incision from 1-2 cm about 3-4 cm in order to remove the specimen.  We  sent part of it for culture and  the rest for frozen section, which  revealed an inflammatory process.  We then sutured the staple line with  3-0 Vicryl and then grasped the right middle lobe, the medial segment,  and likewise using the Covidien 45 stapler, resected with 3  applications.  This was sent for permanent culture of permanent of  resection.  Finally, the right upper lobe anterior segment was grasped  and likewise resected with 2 applications of Covidien stapler.  CoSeal  was applied to the staple line.  A chest tube was placed through the  anterior trocar site and sutured in place with 0 silk.  A Marcaine block  was done in the usual fashion.  A single On-Q was inserted subpleurally  in the usual fashion.  The posterior trocar site was closed with 2-0  Vicryl in the subcutaneous tissue and 3-0 Vicryl as a subcuticular  stitch and Dermabond for  the skin.  The mid trocar site was closed with several interrupted 0  Vicryls, #1 Vicryl in the muscle layer and 3-0 Vicryl as a subcuticular  stitch and Dermabond  for the skin.  The patient was returned to the  recovery room in stable condition.      Ines Bloomer, M.D.  Electronically Signed     DPB/MEDQ  D:  12/06/2007  T:  12/07/2007  Job:  [id number]

## 2011-03-04 NOTE — H&P (Signed)
[name], [name]           ACCOUNT NO.:  [id number]   MEDICAL RECORD NO.:  [id number]          PATIENT TYPE:  INP   LOCATION:  NA                           FACILITY:  [facility]   PHYSICIAN:  Ines Bloomer, M.D. DATE OF BIRTH:  1981-07-05   DATE OF ADMISSION:  DATE OF DISCHARGE:                              HISTORY & PHYSICAL   CHIEF COMPLAINT:  Shortness of breath.   HISTORY OF PRESENT ILLNESS:  This 30 year old patient has increasing  shortness of breath starting in November.  A chest x-ray was obtained  and then she had a CT scan on the 6th that showed marked abnormalities  in both lobes and the right middle lobe with changes in the upper lobes  consistent with an interstitial pneumonitis.  She also has some mild  mediastinal adenopathy.  Her shortness of breath is increased with  exertion.  She does not smoke.  She quit smoking 2 years ago and only  smoked a half pack of cigarettes a day for just 2-3 years.  No  hemoptysis, fever, chills, excessive sputum.  Pulmonary function test  showed an FVC of 1.24 with an FEV1 of 0.91 and diffusion capacity of  34%, which is consistent with severe restrictive lung disease and a  reduced DLCO.   PAST MEDICAL HISTORY:  Positive for migraines.   MEDICATIONS:  1. Flovent 100 mcg daily.  2. Levaquin.   ALLERGIES:  She has no allergies.   She is referred here for VATS lung biopsy.   FAMILY HISTORY:  Negative for cancer and cardiac disease.   SOCIAL HISTORY:  She has one child.  She quit smoking 2 years ago,  occasional alcohol.   REVIEW OF SYSTEMS:  She is 172 pounds.  She is 5 feet 2 inches.  CARDIAC:  No angina or atrial fibrillation.  PULMONARY:  No shortness of  breath with exertion or wheezing.  GI:  No nausea, vomiting,  constipation, diarrhea.  No GERD.  GU:  No dysuria or frequent  urination.  VASCULAR:  No claudication, DVT, TIA.  NEUROLOGIC:  No  headaches, blackouts or dizziness.  MUSCULOSKELETAL:  No joint pain or  rash.  PSYCHIATRIC:  No depression or nervousness.  EYE/ENT:  No change  in her eyesight or hearing.  HEMATOLOGIC:  No problems with anemia,  clotting disorders, or bleeding disorder.   PHYSICAL EXAMINATION:  GENERAL:  She is well developed Caucasian female  in no acute distress.  VITAL SIGNS:  Her blood pressure was 115/74, pulse 92, respirations 18,  sats are 98%.  HEENT:  Head is atraumatic.  Eyes:  Pupils equal reactive to light and  accommodation.  Extraocular movements are normal.  Ears:  Tympanic  membranes are intact.  Nose:  There is no septal deviation.  Throat is  without lesion.  Uvula is in the midline.  NECK:  Supple without thyromegaly.  There is no supraclavicular or  axillary adenopathy.  CHEST:  Clear to auscultation percussion.  HEART:  Regular sinus rhythm.  No murmurs.  ABDOMEN:  Soft.  There is no hepatosplenomegaly.  EXTREMITIES:  Pulses are 2 plus.  There  is no clubbing or edema.  NEUROLOGIC:  She oriented x3.  Sensory and motor intact.  Cranial nerves  are intact.  SKIN:  Without lesion.   IMPRESSION:  Bilateral pulmonary infiltrates rule out interstitial  pneumonitis rule out BOOP.   PLAN:  Right VATS and lung biopsy.      Ines Bloomer, M.D.  Electronically Signed     DPB/MEDQ  D:  12/01/2007  T:  12/02/2007  Job:  [id number]

## 2011-03-04 NOTE — Letter (Signed)
December 16, 2007   Freda Munro  [address]  [place], [place] [address]   Re:  ROSAMARIA, DONN                 DOB:  07-12-1981   Dear Dr. Welton Flakes:   I saw Ms. Rotz back today.  Her incisions were doing well.  Removed  her chest tube sutures.  Her chest x-ray was stable.  Today, it was  unchanged.  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  She is doing well from my standpoint and I will see her back  again in 4 weeks with a chest x-ray for a final check.  I released her  to full activity.   Ines Bloomer, M.D.  Electronically Signed   DPB/MEDQ  D:  12/16/2007  T:  12/17/2007  Job:  [id number]

## 2011-03-04 NOTE — Assessment & Plan Note (Signed)
[name], [name]           ACCOUNT NO.:  [id number]   MEDICAL RECORD NO.:  [id number]          PATIENT TYPE:  POB   LOCATION:  [facility]         FACILITY:  [facility]   PHYSICIAN:  Scheryl Darter, MD       DATE OF BIRTH:  10-25-1980   DATE OF SERVICE:  03/06/2010                                  CLINIC NOTE   The patient is postpartum from vaginal delivery on April 7.  Infant was  a live born female 7 pounds 1 ounce.  She had a first-degree laceration  that was repaired.  She has had a previous cesarean section.  She is  currently nursing and wished to continue with this.  She would like to  have a prescription contraceptive method that did not interfere with her  nursing.  She is willing to take oral contraceptives.   PAST MEDICAL HISTORY:  Hypothyroidism.   MEDICATIONS:  Levothyroxine 100 mcg a day.   REVIEW OF SYSTEMS:  She has no bleeding.  Some slight irritation at the  laceration site.  No abnormal discharge.   PHYSICAL EXAMINATION:  GENERAL:  The patient is in no acute distress.  Affect is normal.  VITAL SIGNS:  Weight is 193 pounds, height is 5 feet 2 inches, blood  pressure is 86/85, and pulse is 85.  ABDOMEN:  Soft and nontender.  PELVIC:  External genitalia shows healing site at the perineum which is  minimally tender.  No swelling or redness.  Vagina shows attenuation of  the rugae consistent with recurrent lactation status.  Cervix is normal.  Uterus is normal size.  No adnexal masses or tenderness.   IMPRESSION:  1. The patient is doing well postpartum.  2. History of hypothyroidism.   PLAN:  Check a TSH today as this has not been checked since the last  time.  Her dose was changed.  I gave her prescription for Micronor which  she can start today.  Recommend she return in about 6 months for yearly  exam.      Scheryl Darter, MD     JA/MEDQ  D:  03/06/2010  T:  03/07/2010  Job:  [id number]

## 2011-03-04 NOTE — Discharge Summary (Signed)
[name], [name]           ACCOUNT NO.:  [id number]   MEDICAL RECORD NO.:  [id number]          PATIENT TYPE:  INP   LOCATION:  3304                         FACILITY:  [facility]   PHYSICIAN:  Ines Bloomer, M.D. DATE OF BIRTH:  [birth year]   DATE OF ADMISSION:  12/06/2007  DATE OF DISCHARGE:  12/09/2007                               DISCHARGE SUMMARY   PRIMARY ADMITTING DIAGNOSIS:  Shortness of breath.   ADDITIONAL AND DISCHARGE DIAGNOSES:  1. The post bilateral lower lobe infiltrates.  2. History of migraines.  3. Previous history of tobacco abuse.   PROCEDURES PERFORMED:  Right VAT with right lung biopsies x3.   HISTORY:  The patient is a 30 year old female who was recently referred  to Dr. Edwyna Shell for evaluation of bilateral lower lobe infiltrates.  She  initially presented with shortness of breath in November, and a chest x-  ray at that time was positive for bilateral lower lobe infiltrates.  She  subsequently underwent a CT scan which showed marked abnormalities in  both lungs in the lower lobe with changes in the right middle lobe and  right upper lobe consistent with interstitial pneumonitis.  She also had  some mediastinal adenopathy.  She has a previous history of tobacco use  for 2-3 years and has subsequently quit smoking.  Dr. Edwyna Shell saw the  patient and felt that her best course of action would be to proceed with  a right VATS with biopsies at this time to rule out interstitial  pneumonitis versus BOOP.  He explained the risks, benefits and  alternatives of the procedure to the patient, and she agreed to proceed.   HOSPITAL COURSE:  She was admitted to [facility] on December 06, 2007, and underwent a right VATS with lung biopsies.  She tolerated  the procedure well and was transferred to the floor in stable condition.  Her postoperative course has been uneventful.  Her chest tubes have been  removed in the usual fashion.  Her followup chest x-rays  remain stable.  She has been seen in consultation by Dr. Marchelle Gearing for pulmonary  medicine.  Her final pathology is pending at this time.  Her samples  have been sent for special studies, and preliminary report is not even  available at this point.   She is overall doing well, however.  She has been started on Augmentin  875 mg for sore throat and earache.  She has remained afebrile.  Her  vital signs have been stable.  Her incisions are healing well.  She is  ambulating the halls without difficulty and tolerating a regular diet.  Her most recent labs show hemoglobin of 11.5, hematocrit 34, white count  9.7, platelets 232. Sodium 136, potassium 4.1, BUN 4, creatinine 0.5.  She has been seen and evaluated by Dr. Edwyna Shell this morning, and it is  felt that at this time she is stable and ready for discharge home.   DISCHARGE MEDICATIONS:  1. Augmentin 875 mg b.i.d. x1 week.  2. Flovent metered-dose inhaler 2 puffs daily.  3. Tylox one to two q.4 h p.r.n. for pain.  DISCHARGE INSTRUCTIONS:  She is asked to refrain from driving, heavy  lifting or strenuous activity.  She may continue ambulating daily and  using her incentive spirometer.  She may shower daily and clean her  incisions with soap and water.  She will continue her same preoperative  diet.   DISCHARGE FOLLOWUP:  She will see Dr. Edwyna Shell back in the office in 1  week with a chest x-ray.  Hopefully at that point, her pathology report  will be back, and he will discuss further interventions with her at that  time.  Also at that time, of followup will be arranged with Dr.  Marchelle Gearing and his associates.      Coral Ceo, P.A.      Ines Bloomer, M.D.  Electronically Signed    GC/MEDQ  D:  12/09/2007  T:  12/09/2007  Job:  [id number]   cc:   Kalman Shan, MD  [facility] Office

## 2011-07-11 LAB — COMPREHENSIVE METABOLIC PANEL
ALT: 12
AST: 26
AST: 17
Albumin: 2.6 — ABNORMAL LOW
Alkaline Phosphatase: 50
BUN: 4 — ABNORMAL LOW
CO2: 22
Chloride: 111
Chloride: 102
Creatinine, Ser: 0.54
GFR calc Af Amer: >60
GFR calc Af Amer: >60
GFR calc non Af Amer: >60
Glucose, Bld: 94
Potassium: 4.1
Sodium: 136
Total Bilirubin: 0.5
Total Bilirubin: 0.6

## 2011-07-11 LAB — AFB CULTURE WITH SMEAR (NOT AT ARMC)

## 2011-07-11 LAB — TISSUE CULTURE: Culture: NO GROWTH

## 2011-07-11 LAB — BLOOD GAS, ARTERIAL
Acid-base deficit: 0.1
Bicarbonate: 25 — ABNORMAL HIGH
FIO2: 0.21
O2 Saturation: 95.7
Patient temperature: 98.6
Patient temperature: 98.6
pH, Arterial: 7.38
pO2, Arterial: 76.2 — ABNORMAL LOW

## 2011-07-11 LAB — BASIC METABOLIC PANEL
Calcium: 8.2 — ABNORMAL LOW
GFR calc Af Amer: >60
GFR calc non Af Amer: >60
Glucose, Bld: 121 — ABNORMAL HIGH
Sodium: 134 — ABNORMAL LOW

## 2011-07-11 LAB — CBC
HCT: 34 — ABNORMAL LOW
Hemoglobin: 11.3 — ABNORMAL LOW
MCHC: 33.8
MCV: 83.3
Platelets: 232
RBC: 4.59
RBC: 4.04
RDW: 14.4
RDW: 14
WBC: 9.7

## 2011-07-11 LAB — FUNGUS CULTURE W SMEAR: Fungal Smear: NONE SEEN

## 2011-07-11 LAB — URINALYSIS, ROUTINE W REFLEX MICROSCOPIC
Bilirubin Urine: NEGATIVE
Ketones, ur: NEGATIVE
Nitrite: NEGATIVE
pH: 6.5

## 2011-07-11 LAB — PROTIME-INR: Prothrombin Time: 13.5

## 2011-07-11 LAB — TYPE AND SCREEN: Antibody Screen: NEGATIVE

## 2011-07-11 LAB — URINE MICROSCOPIC-ADD ON

## 2011-07-11 LAB — APTT: aPTT: 32

## 2011-09-02 ENCOUNTER — Ambulatory Visit (INDEPENDENT_AMBULATORY_CARE_PROVIDER_SITE_OTHER): Payer: Self-pay | Admitting: Obstetrics & Gynecology

## 2011-09-02 ENCOUNTER — Encounter: Payer: Self-pay | Admitting: Obstetrics & Gynecology

## 2011-09-02 VITALS — BP 99/76 | HR 82 | Ht 62.0 in | Wt 174.0 lb

## 2011-09-02 DIAGNOSIS — Z3042 Encounter for surveillance of injectable contraceptive: Secondary | ICD-10-CM

## 2011-09-02 DIAGNOSIS — Z3049 Encounter for surveillance of other contraceptives: Secondary | ICD-10-CM

## 2011-09-02 DIAGNOSIS — Z1272 Encounter for screening for malignant neoplasm of vagina: Secondary | ICD-10-CM

## 2011-09-02 DIAGNOSIS — Z01419 Encounter for gynecological examination (general) (routine) without abnormal findings: Secondary | ICD-10-CM

## 2011-09-02 DIAGNOSIS — Z113 Encounter for screening for infections with a predominantly sexual mode of transmission: Secondary | ICD-10-CM

## 2011-09-02 DIAGNOSIS — E039 Hypothyroidism, unspecified: Secondary | ICD-10-CM

## 2011-09-02 MED ORDER — MEDROXYPROGESTERONE ACETATE 150 MG/ML IM SUSP
150.0000 mg | INTRAMUSCULAR | Status: DC
  Administered 2011-09-03 – 2011-12-04 (×2): 150 mg via INTRAMUSCULAR

## 2011-09-02 NOTE — Patient Instructions (Signed)

## 2011-09-02 NOTE — Progress Notes (Signed)
  Subjective:     [name] is a 30 y.o. female G2P2 here for a routine exam.  Current complaints: None.     Gynecologic History Patient's last menstrual period was 08/13/2011. Contraception: OCP (estrogen/progesterone) Last Pap: 06/2010. Results were: normal  Obstetric History: G2P2 PAST MEDICAL HISTORY: Hypothyroidism.  PAST SURGICAL HISTORY: Cesarean section in December 2007. MEDICATIONS: Levothyroxine 75 mcg a day.  ALLERGIES: No known drug allergies.  SOCIAL HISTORY: She is married and denies alcohol, tobacco, or drug use.  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 FAMILY HISTORY: Throat cancer in her father.   The following portions of the patient's history were reviewed and updated as appropriate: allergies, current medications, past family history, past medical history, past social history, past surgical history and problem list.  Review of Systems A comprehensive review of systems was negative.    Objective:    Blood pressure 99/76, pulse 82, height 5\' 2"  (1.575 m), weight 174 lb (78.926 kg), last menstrual period 08/13/2011. GENERAL: Well-developed, well-nourished female in no acute distress.  HEENT: Normocephalic, atraumatic. Sclerae anicteric.  NECK: Supple. Normal thyroid.  LUNGS: Clear to auscultation bilaterally.  HEART: Regular rate and rhythm. BREASTS: Symmetric with everted nipples. No masses, skin changes, nipple drainage, or lymphadenopathy. ABDOMEN: Soft, nontender, nondistended. No organomegaly. PELVIC: Normal external female genitalia. Vagina is pink and rugated.  Normal discharge. Normal cervix contour with prominent ectropion. Pap smear obtained. Uterus is normal in size. No adnexal mass or tenderness.  EXTREMITIES: No cyanosis, clubbing, or edema, 2+ distal pulses.     Assessment:   Healthy female exam.  Hypothyroidism on Levothyroxine   Plan:  Will follow up pap smear. Thyroid labs today; other labs checked at work.  Contraception:  Depo-Provera injections.   Counseling given about other birth control, patient wants to start Depo Provera today.   Return to clinic in 3 months for Depo Provera.

## 2011-09-04 MED ORDER — LEVOTHYROXINE SODIUM 100 MCG PO TABS: 100.0000 ug | ORAL_TABLET | Freq: Every day | ORAL | Status: DC

## 2011-09-04 NOTE — Progress Notes (Signed)
Addended by: Barbara Cower on: 09/04/2011 04:00 PM   Modules accepted: Orders

## 2011-09-04 NOTE — Progress Notes (Signed)
Spoke to patient regarding her results, she had run out of her synthroid and was taking some left over at the time of her testing.  We will call in her correct dose and recheck her levels in 3-4 weeks.  She does not have a primary care or endocrine Dr.  I explained to her if her testing was still out of whack that we would need to refer her.  She has not ever had major issues with her dosage.

## 2011-09-04 NOTE — Progress Notes (Signed)
Left message for patient to call back for test results.

## 2011-09-10 ENCOUNTER — Ambulatory Visit: Payer: Self-pay | Admitting: Internal Medicine

## 2011-09-10 NOTE — Progress Notes (Signed)
[name] spoke to patient.

## 2011-09-25 ENCOUNTER — Other Ambulatory Visit: Payer: Self-pay

## 2011-09-26 ENCOUNTER — Other Ambulatory Visit: Payer: Self-pay

## 2011-09-30 ENCOUNTER — Other Ambulatory Visit (INDEPENDENT_AMBULATORY_CARE_PROVIDER_SITE_OTHER): Payer: 59 | Admitting: Gynecology

## 2011-09-30 DIAGNOSIS — E039 Hypothyroidism, unspecified: Secondary | ICD-10-CM

## 2011-10-01 ENCOUNTER — Telehealth: Payer: Self-pay | Admitting: Gynecology

## 2011-10-01 MED ORDER — LEVOTHYROXINE SODIUM 125 MCG PO TABS: 125.0000 ug | ORAL_TABLET | Freq: Every day | ORAL | Status: DC

## 2011-10-01 NOTE — Progress Notes (Signed)
TSH result reviewed. Synthroid increased to 125 mcg daily. Patient to be referred to internal medicine for further management. If appointment cannot be scheduled within 4-6 weeks, patient to return for repeat TSH in our office.

## 2011-10-01 NOTE — Telephone Encounter (Signed)
Message copied by Marylyn Ishihara on Wed Oct 01, 2011  4:34 PM ------      Message from: CONSTANT, PEGGY      Created: Wed Oct 01, 2011  9:18 AM       Please inform patient that new synthroid prescription (125 mcg daily) e-prescribed to her pharmacy. She also needs a referral to internal medicine or family practice for further management.            If the referral appointment cannot be obtained within 4-6 weeks, patient needs to return to our office during that time frame for repeat TSH.                  Thanks             [employer]

## 2011-10-01 NOTE — Telephone Encounter (Signed)
Spoke with patient aware of result. Patient will contact her primary care doctor and schedule an appointment with them.

## 2011-10-01 NOTE — Progress Notes (Signed)
Addended by: Catalina Antigua on: 10/01/2011 09:18 AM   Modules accepted: Orders

## 2011-12-04 ENCOUNTER — Ambulatory Visit (INDEPENDENT_AMBULATORY_CARE_PROVIDER_SITE_OTHER): Payer: 59 | Admitting: *Deleted

## 2011-12-04 DIAGNOSIS — Z3042 Encounter for surveillance of injectable contraceptive: Secondary | ICD-10-CM

## 2011-12-04 DIAGNOSIS — Z3049 Encounter for surveillance of other contraceptives: Secondary | ICD-10-CM

## 2011-12-04 NOTE — Progress Notes (Signed)
Patient is here for Depo Provera injection. 

## 2012-02-06 ENCOUNTER — Ambulatory Visit (INDEPENDENT_AMBULATORY_CARE_PROVIDER_SITE_OTHER): Payer: 59 | Admitting: Family Medicine

## 2012-02-06 ENCOUNTER — Encounter: Payer: Self-pay | Admitting: Family Medicine

## 2012-02-06 VITALS — BP 124/84 | HR 82 | Temp 98.5°F | Wt 182.0 lb

## 2012-02-06 DIAGNOSIS — R05 Cough: Secondary | ICD-10-CM

## 2012-02-06 MED ORDER — AZITHROMYCIN 250 MG PO TABS: ORAL_TABLET | ORAL | Status: AC

## 2012-02-06 MED ORDER — ALBUTEROL SULFATE HFA 108 (90 BASE) MCG/ACT IN AERS: 2.0000 | INHALATION_SPRAY | Freq: Four times a day (QID) | RESPIRATORY_TRACT | Status: DC | PRN

## 2012-02-06 NOTE — Progress Notes (Signed)
"  I think allergies started all of it"  duration of symptoms:  2 weeks, started with ST, hoarse voice and chest pressure.  Since then, inc in cough and chest pressure but rhinorrhea is resolved.   No fevers.  Some sputum, in AM- dark gray/green.   Using SABA with some relief of chest pressure.    H/o sig PNA hx with prev VATS.  Was on zithromax about 4 months ago per pulm but wasn't on steroids.  This feels similar.   ROS: See HPI.  Otherwise negative.    Meds, vitals, and allergies reviewed.   nad ncat Tm wnl Nasal and OP exam wnl No stridor Neck supple, no LA rrr ctab w/o wheeze

## 2012-02-06 NOTE — Assessment & Plan Note (Signed)
Given her history and duration, I would treat.  Continue SABA, start zmax and f/u prn.  Nontoxic.  Okay for outpatient f/u.  She agrees with plan.

## 2012-03-04 ENCOUNTER — Ambulatory Visit: Payer: 59

## 2012-03-04 ENCOUNTER — Telehealth: Payer: Self-pay | Admitting: *Deleted

## 2012-03-04 DIAGNOSIS — IMO0001 Reserved for inherently not codable concepts without codable children: Secondary | ICD-10-CM

## 2012-03-04 MED ORDER — NORGESTREL-ETHINYL ESTRADIOL 0.3-30 MG-MCG PO TABS: 1.0000 | ORAL_TABLET | Freq: Every day | ORAL | Status: DC

## 2012-03-04 NOTE — Telephone Encounter (Signed)
Patient is wanting to switch back to her birth control pills, she is not liking the side effects of the Depo Provera.

## 2012-03-31 ENCOUNTER — Ambulatory Visit: Payer: 59 | Admitting: Obstetrics and Gynecology

## 2012-04-16 ENCOUNTER — Ambulatory Visit (INDEPENDENT_AMBULATORY_CARE_PROVIDER_SITE_OTHER): Payer: 59 | Admitting: Family Medicine

## 2012-04-16 ENCOUNTER — Encounter: Payer: Self-pay | Admitting: Family Medicine

## 2012-04-16 VITALS — BP 114/64 | HR 72 | Temp 98.2°F | Wt 191.0 lb

## 2012-04-16 DIAGNOSIS — E039 Hypothyroidism, unspecified: Secondary | ICD-10-CM

## 2012-04-16 DIAGNOSIS — G43009 Migraine without aura, not intractable, without status migrainosus: Secondary | ICD-10-CM

## 2012-04-16 DIAGNOSIS — G43909 Migraine, unspecified, not intractable, without status migrainosus: Secondary | ICD-10-CM

## 2012-04-16 MED ORDER — CYCLOBENZAPRINE HCL 10 MG PO TABS: 5.0000 mg | ORAL_TABLET | Freq: Three times a day (TID) | ORAL | Status: AC | PRN

## 2012-04-16 NOTE — Progress Notes (Signed)
"  I've always had headaches."  3 weeks of HA recently, with 4 'bad' episodes that were progressively worse- nausea, blurred vision, R side of face was numb.  Throbbing pain.  Phonophobia but no photophobia.  Laying in a dark room helps.  Less pain after vomiting.    HA- lifelong, even 'as a little kid.'  Mother with migraines.  Worse around menses.  Worse with MSG.  No other trigger foods.  Caffeine helps temporarily.    She'll get an atypical sensation in her chest during bad episodes.  No aura.  Tried exceedrin, aleve, APAP, ibuprofen.  Minimal help with meds.    Using condoms for birth control.  No plan for pregnancy.    Meds, vitals, and allergies reviewed.   ROS: See HPI.  Otherwise, noncontributory.  CPE- See plan.  Routine anticipatory guidance given to patient.  See health maintenance.  PMH and SH reviewed  Meds, vitals, and allergies reviewed.   ROS: See HPI.  Otherwise negative.    GEN: nad, alert and oriented HEENT: mucous membranes moist, tm wnl, nasal exam wnl NECK: supple w/o LA CV: rrr. PULM: ctab, no inc wob ABD: soft, +bs EXT: no edema SKIN: no acute rash CN 2-12 wnl B, S/S/DTR wnl x4

## 2012-04-16 NOTE — Patient Instructions (Addendum)
We'll contact you with your lab report. We may need to get you a prescription for topamax to prevent migraines. Take the flexeril when you feel a migraine starting.  Limit caffeine, tylenol and ibuprofen.

## 2012-04-17 LAB — COMPREHENSIVE METABOLIC PANEL
Albumin: 4.4 g/dL (ref 3.5–5.2)
BUN: 9 mg/dL (ref 6–23)
Calcium: 9.7 mg/dL (ref 8.4–10.5)
Chloride: 106 mEq/L (ref 96–112)
Glucose, Bld: 89 mg/dL (ref 70–99)
Potassium: 4.3 mEq/L (ref 3.5–5.3)
Sodium: 140 mEq/L (ref 135–145)
Total Protein: 7.3 g/dL (ref 6.0–8.3)

## 2012-04-18 DIAGNOSIS — G43009 Migraine without aura, not intractable, without status migrainosus: Secondary | ICD-10-CM | POA: Insufficient documentation

## 2012-04-18 MED ORDER — TOPIRAMATE 25 MG PO TABS: 25.0000 mg | ORAL_TABLET | Freq: Every day | ORAL | Status: DC

## 2012-04-18 NOTE — Assessment & Plan Note (Signed)
Start topamax for prophylaxis, use flexeril for migraines.  D/w pt.  Labs reviewed.  She'll limit tylenol and nsaids.  Did use triptan due to h/o SZ, though none in years.  >25 min spent with face to face with patient, >50% counseling and/or coordinating care.

## 2012-04-19 ENCOUNTER — Other Ambulatory Visit: Payer: Self-pay | Admitting: *Deleted

## 2012-07-22 ENCOUNTER — Ambulatory Visit (INDEPENDENT_AMBULATORY_CARE_PROVIDER_SITE_OTHER): Payer: 59 | Admitting: Family Medicine

## 2012-07-22 ENCOUNTER — Encounter: Payer: Self-pay | Admitting: Family Medicine

## 2012-07-22 VITALS — BP 104/70 | HR 78 | Temp 98.2°F | Wt 192.8 lb

## 2012-07-22 DIAGNOSIS — R05 Cough: Secondary | ICD-10-CM

## 2012-07-22 DIAGNOSIS — J029 Acute pharyngitis, unspecified: Secondary | ICD-10-CM

## 2012-07-22 MED ORDER — AZITHROMYCIN 250 MG PO TABS: ORAL_TABLET | ORAL | Status: DC

## 2012-07-22 NOTE — Assessment & Plan Note (Signed)
Nontoxic, likely viral.  Would use SABA with rest/fluids.  Would start abx if persisting/worsending.  ddx d/w pt.  She agrees.  F/u prn.

## 2012-07-22 NOTE — Progress Notes (Signed)
H/o mult strep exposures and h/o esopinophilic PNA in past with sx over that last 5 days.  Fevers, aches, cough and sputum.  No wheeze.  Some chest pressure, chest feels tight.  L ear pain.  No rhinorrhea, some facial pain.    Sees pulmonary yearly.  Not on steroids.  Using SABA recently, in the AM only, with some relief.   Meds, vitals, and allergies reviewed.   ROS: See HPI.  Otherwise, noncontributory.  GEN: nad, alert and oriented HEENT: mucous membranes moist, tm w/o erythema, nasal exam w/o erythema, clear discharge noted,  OP with cobblestoning NECK: supple w/o LA CV: rrr.   PULM: ctab, no inc wob EXT: no edema SKIN: no acute rash

## 2012-07-22 NOTE — Patient Instructions (Addendum)
Use the inhaler up to 3 times a day for the cough.  Start the zithromax if not improve.  Rest and fluids in meantime.  I would get a flu shot each fall, when you are feeling better.

## 2012-08-24 ENCOUNTER — Ambulatory Visit: Payer: 59 | Admitting: Obstetrics and Gynecology

## 2012-09-27 ENCOUNTER — Ambulatory Visit: Payer: Self-pay | Admitting: Internal Medicine

## 2013-03-15 ENCOUNTER — Telehealth: Payer: Self-pay | Admitting: *Deleted

## 2013-03-15 DIAGNOSIS — E039 Hypothyroidism, unspecified: Secondary | ICD-10-CM

## 2013-03-15 MED ORDER — LEVOTHYROXINE SODIUM 125 MCG PO TABS: 125.0000 ug | ORAL_TABLET | Freq: Every day | ORAL | Status: DC

## 2013-03-15 NOTE — Telephone Encounter (Signed)
Patient needs refill of her thyroid medication. 

## 2013-08-24 ENCOUNTER — Other Ambulatory Visit: Payer: Self-pay | Admitting: Family Medicine

## 2013-08-26 ENCOUNTER — Ambulatory Visit (INDEPENDENT_AMBULATORY_CARE_PROVIDER_SITE_OTHER): Payer: 59 | Admitting: Family Medicine

## 2013-08-26 ENCOUNTER — Encounter: Payer: Self-pay | Admitting: Family Medicine

## 2013-08-26 VITALS — BP 110/70 | HR 79 | Temp 98.0°F | Wt 196.2 lb

## 2013-08-26 DIAGNOSIS — R05 Cough: Secondary | ICD-10-CM

## 2013-08-26 DIAGNOSIS — E039 Hypothyroidism, unspecified: Secondary | ICD-10-CM

## 2013-08-26 MED ORDER — ALBUTEROL SULFATE HFA 108 (90 BASE) MCG/ACT IN AERS: 2.0000 | INHALATION_SPRAY | Freq: Four times a day (QID) | RESPIRATORY_TRACT | Status: DC | PRN

## 2013-08-26 NOTE — Progress Notes (Signed)
Cold and cough started recently, lost voice, returning now.  No fevers.  Some sputum, mostly in AM. No ear pain.  Some rhinorrhea. No rash, vomiting, diarrhea.  She usually only uses SABA with a cold. She thinks she is getting better.  She'll get a flu shot when well.    Still on thyroid replacement. No neck mass, dysphagia. Generally energy level is okay.    Meds, vitals, and allergies reviewed.   ROS: See HPI.  Otherwise, noncontributory.  GEN: nad, alert and oriented HEENT: mucous membranes moist, tm w/o erythema, nasal exam w/o erythema, clear discharge noted,  OP with cobblestoning NECK: supple w/o LA, no TMG CV: rrr.   PULM: ctab, no inc wob EXT: no edema SKIN: no acute rash

## 2013-08-26 NOTE — Patient Instructions (Signed)
Go to the lab on the way out.  We'll contact you with your lab report. Take care.   Use the inhaler as needed.  Notify me if needed frequently.   I would get a flu shot each fall.

## 2013-08-26 NOTE — Progress Notes (Signed)
Pre-visit discussion using our clinic review tool. No additional management support is needed unless otherwise documented below in the visit note.  

## 2013-08-28 ENCOUNTER — Other Ambulatory Visit: Payer: Self-pay | Admitting: Family Medicine

## 2013-08-28 DIAGNOSIS — E039 Hypothyroidism, unspecified: Secondary | ICD-10-CM

## 2013-08-28 MED ORDER — LEVOTHYROXINE SODIUM 125 MCG PO TABS: 125.0000 ug | ORAL_TABLET | Freq: Every day | ORAL | Status: DC

## 2013-08-29 NOTE — Assessment & Plan Note (Signed)
See notes on labs. 

## 2013-08-29 NOTE — Assessment & Plan Note (Signed)
Benign exam, would use SABA and f/u prn.  Lungs ctab.  D/w pt, she agrees.

## 2013-08-30 ENCOUNTER — Other Ambulatory Visit: Payer: Self-pay | Admitting: *Deleted

## 2013-08-30 ENCOUNTER — Telehealth: Payer: Self-pay | Admitting: *Deleted

## 2013-08-30 DIAGNOSIS — E039 Hypothyroidism, unspecified: Secondary | ICD-10-CM

## 2013-08-30 MED ORDER — LEVOTHYROXINE SODIUM 125 MCG PO TABS: 125.0000 ug | ORAL_TABLET | Freq: Every day | ORAL | Status: DC

## 2013-08-30 MED ORDER — BENZONATATE 200 MG PO CAPS: 200.0000 mg | ORAL_CAPSULE | Freq: Three times a day (TID) | ORAL | Status: DC | PRN

## 2013-08-30 NOTE — Telephone Encounter (Signed)
Patient says she saw you recently with cold symptoms and still has a nasty cough.  She says she hasn't slept well in a week.  She is asking if you would call in some cough medication to [facility] in [place]?

## 2013-08-30 NOTE — Telephone Encounter (Signed)
Patient advised.

## 2013-08-30 NOTE — Telephone Encounter (Signed)
Would try tessalon- rx sent.   If she doesn't get help with that, then we can get her a hard copy rx for hycodan.  Let me know if needed.  Thanks.

## 2013-11-01 ENCOUNTER — Other Ambulatory Visit: Payer: 59

## 2013-11-12 ENCOUNTER — Ambulatory Visit: Payer: Self-pay | Admitting: Physician Assistant

## 2013-11-12 LAB — RAPID STREP-A WITH REFLX: Micro Text Report: NEGATIVE

## 2013-11-15 LAB — BETA STREP CULTURE(ARMC)

## 2014-09-01 ENCOUNTER — Other Ambulatory Visit: Payer: Self-pay | Admitting: Family Medicine

## 2014-10-07 ENCOUNTER — Other Ambulatory Visit: Payer: Self-pay | Admitting: Family Medicine

## 2014-10-24 ENCOUNTER — Ambulatory Visit (INDEPENDENT_AMBULATORY_CARE_PROVIDER_SITE_OTHER): Payer: 59 | Admitting: Family Medicine

## 2014-10-24 ENCOUNTER — Encounter: Payer: Self-pay | Admitting: Family Medicine

## 2014-10-24 VITALS — BP 104/64 | HR 73 | Temp 97.8°F | Wt 194.5 lb

## 2014-10-24 DIAGNOSIS — R05 Cough: Secondary | ICD-10-CM

## 2014-10-24 DIAGNOSIS — E039 Hypothyroidism, unspecified: Secondary | ICD-10-CM

## 2014-10-24 DIAGNOSIS — R059 Cough, unspecified: Secondary | ICD-10-CM

## 2014-10-24 MED ORDER — ALBUTEROL SULFATE HFA 108 (90 BASE) MCG/ACT IN AERS: 1.0000 | INHALATION_SPRAY | Freq: Four times a day (QID) | RESPIRATORY_TRACT | Status: DC | PRN

## 2014-10-24 MED ORDER — BENZONATATE 200 MG PO CAPS: 200.0000 mg | ORAL_CAPSULE | Freq: Three times a day (TID) | ORAL | Status: DC | PRN

## 2014-10-24 MED ORDER — LEVOTHYROXINE SODIUM 125 MCG PO TABS: ORAL_TABLET | ORAL | Status: DC

## 2014-10-24 NOTE — Progress Notes (Signed)
Pre visit review using our clinic review tool, if applicable. No additional management support is needed unless otherwise documented below in the visit note.  Mult sick contacts and recently with fever, ST, cough.  Cough is better with inhaler.  Tessalon helps some.  Some sputum, is thinning out.  Breathing is improved, except for AM with more pain and chest congestion.  She is making some progress.  Sick for about 3 weeks overall.    Hypothyroid.  Due for f/u TSH.  No ADE. No neck mass, no lumps, no dysphagia.  Energy level is fair.  No hair or weight changes.    Meds, vitals, and allergies reviewed.   ROS: See HPI.  Otherwise, noncontributory.  GEN: nad, alert and oriented HEENT: mucous membranes moist, tm wnl, nasal and OP exam wnl NECK: supple w/o LA, no tmg CV: rrr.  PULM: ctab, no inc wob ABD: soft, +bs  TSH pending.

## 2014-10-24 NOTE — Patient Instructions (Signed)
Go to the lab on the way out (future order for TSH in EMR).  We'll contact you with your lab report. I would get a flu shot each fall.   Take care.  Glad to see you.

## 2014-10-25 ENCOUNTER — Other Ambulatory Visit: Payer: Self-pay | Admitting: Family Medicine

## 2014-10-25 DIAGNOSIS — E039 Hypothyroidism, unspecified: Secondary | ICD-10-CM

## 2014-10-25 LAB — TSH: TSH: 10.52 u[IU]/mL — AB (ref 0.35–4.50)

## 2014-10-25 MED ORDER — LEVOTHYROXINE SODIUM 125 MCG PO TABS: ORAL_TABLET | ORAL | Status: DC

## 2014-10-25 NOTE — Assessment & Plan Note (Signed)
Due for f/u TSH, pending.  No ADE. No neck mass, no lumps, no dysphagia.  Energy level is fair.  No hair or weight changes.   rx refilled.

## 2014-10-25 NOTE — Assessment & Plan Note (Signed)
Resolving uri, nontoxic.  Flu shot encouraged, declined.

## 2014-11-29 ENCOUNTER — Other Ambulatory Visit: Payer: 59

## 2014-12-04 ENCOUNTER — Other Ambulatory Visit: Payer: 59

## 2014-12-08 ENCOUNTER — Other Ambulatory Visit: Payer: 59

## 2015-01-16 ENCOUNTER — Other Ambulatory Visit (INDEPENDENT_AMBULATORY_CARE_PROVIDER_SITE_OTHER): Payer: 59

## 2015-01-16 DIAGNOSIS — E039 Hypothyroidism, unspecified: Secondary | ICD-10-CM

## 2015-01-16 LAB — TSH: TSH: 1.36 u[IU]/mL (ref 0.35–4.50)

## 2015-01-17 ENCOUNTER — Telehealth: Payer: Self-pay

## 2015-01-17 NOTE — Telephone Encounter (Signed)
Patient aware of lab results and recommendations. 

## 2015-01-17 NOTE — Telephone Encounter (Signed)
-----   Message from Joaquim NamGraham S Duncan, MD sent at 01/17/2015 11:30 AM EDT ----- Notify pt.  TSH wnl, continue as is.  Recheck yearly.  Thanks.

## 2015-11-28 ENCOUNTER — Other Ambulatory Visit: Payer: Self-pay | Admitting: *Deleted

## 2015-11-28 DIAGNOSIS — E039 Hypothyroidism, unspecified: Secondary | ICD-10-CM

## 2015-11-28 NOTE — Telephone Encounter (Signed)
Received faxed refill request from pharmacy for Levothyroxine 125 mcg, take one table by mouth daily except 1.5 on Sunday REFILL REQUEST DOES NOT MATCH MEDICATION LIST Last office visit 10/24/14, last TSH 01/16/15 Should it be refilled as listed on medication list?

## 2015-11-29 MED ORDER — LEVOTHYROXINE SODIUM 125 MCG PO TABS: ORAL_TABLET | ORAL | Status: DC

## 2015-11-29 NOTE — Telephone Encounter (Signed)
Continue has she has been, sent.   Needs TSH check before OV later in the spring.  Thanks.

## 2015-11-29 NOTE — Telephone Encounter (Signed)
Left detailed message on voicemail.  

## 2016-01-11 ENCOUNTER — Other Ambulatory Visit (INDEPENDENT_AMBULATORY_CARE_PROVIDER_SITE_OTHER): Payer: 59

## 2016-01-11 DIAGNOSIS — E039 Hypothyroidism, unspecified: Secondary | ICD-10-CM

## 2016-01-11 LAB — TSH: TSH: 0.18 u[IU]/mL — ABNORMAL LOW (ref 0.35–4.50)

## 2016-01-13 ENCOUNTER — Other Ambulatory Visit: Payer: Self-pay | Admitting: Family Medicine

## 2016-01-13 DIAGNOSIS — E039 Hypothyroidism, unspecified: Secondary | ICD-10-CM

## 2016-01-13 MED ORDER — LEVOTHYROXINE SODIUM 125 MCG PO TABS: ORAL_TABLET | ORAL | Status: DC

## 2016-02-09 ENCOUNTER — Ambulatory Visit
Admission: EM | Admit: 2016-02-09 | Discharge: 2016-02-09 | Disposition: A | Discharge status: Home / Self Care | Payer: 59 | Attending: Family Medicine | Admitting: Family Medicine

## 2016-02-09 ENCOUNTER — Encounter: Payer: Self-pay | Admitting: Gynecology

## 2016-02-09 DIAGNOSIS — J301 Allergic rhinitis due to pollen: Secondary | ICD-10-CM

## 2016-02-09 DIAGNOSIS — J0100 Acute maxillary sinusitis, unspecified: Secondary | ICD-10-CM

## 2016-02-09 DIAGNOSIS — H6593 Unspecified nonsuppurative otitis media, bilateral: Secondary | ICD-10-CM | POA: Diagnosis not present

## 2016-02-09 MED ORDER — IBUPROFEN 800 MG PO TABS: 800.0000 mg | ORAL_TABLET | Freq: Three times a day (TID) | ORAL | Status: DC

## 2016-02-09 MED ORDER — OXYMETAZOLINE HCL 0.05 % NA SOLN: 1.0000 | Freq: Two times a day (BID) | NASAL | Status: DC

## 2016-02-09 MED ORDER — PSEUDOEPHEDRINE HCL 30 MG PO TABS: 30.0000 mg | ORAL_TABLET | ORAL | Status: DC | PRN

## 2016-02-09 MED ORDER — SALINE SPRAY 0.65 % NA SOLN: 2.0000 | NASAL | Status: DC

## 2016-02-09 MED ORDER — AMOXICILLIN-POT CLAVULANATE 875-125 MG PO TABS: 1.0000 | ORAL_TABLET | Freq: Two times a day (BID) | ORAL | Status: DC

## 2016-02-09 MED ORDER — FLUTICASONE PROPIONATE 50 MCG/ACT NA SUSP: 1.0000 | Freq: Two times a day (BID) | NASAL | Status: DC

## 2016-02-09 NOTE — ED Notes (Signed)
Patient c/o sinus infection x 8 days.

## 2016-02-09 NOTE — Discharge Instructions (Signed)
Allergic Rhinitis Allergic rhinitis is when the mucous membranes in the nose respond to allergens. Allergens are particles in the air that cause your body to have an allergic reaction. This causes you to release allergic antibodies. Through a chain of events, these eventually cause you to release histamine into the blood stream. Although meant to protect the body, it is this release of histamine that causes your discomfort, such as frequent sneezing, congestion, and an itchy, runny nose.  CAUSES Seasonal allergic rhinitis (hay fever) is caused by pollen allergens that may come from grasses, trees, and weeds. Year-round allergic rhinitis (perennial allergic rhinitis) is caused by allergens such as house dust mites, pet dander, and mold spores. SYMPTOMS  Nasal stuffiness (congestion).  Itchy, runny nose with sneezing and tearing of the eyes. DIAGNOSIS Your health care provider can help you determine the allergen or allergens that trigger your symptoms. If you and your health care provider are unable to determine the allergen, skin or blood testing may be used. Your health care provider will diagnose your condition after taking your health history and performing a physical exam. Your health care provider may assess you for other related conditions, such as asthma, pink eye, or an ear infection. TREATMENT Allergic rhinitis does not have a cure, but it can be controlled by:  Medicines that block allergy symptoms. These may include allergy shots, nasal sprays, and oral antihistamines.  Avoiding the allergen. Hay fever may often be treated with antihistamines in pill or nasal spray forms. Antihistamines block the effects of histamine. There are over-the-counter medicines that may help with nasal congestion and swelling around the eyes. Check with your health care provider before taking or giving this medicine. If avoiding the allergen or the medicine prescribed do not work, there are many new medicines  your health care provider can prescribe. Stronger medicine may be used if initial measures are ineffective. Desensitizing injections can be used if medicine and avoidance does not work. Desensitization is when a patient is given ongoing shots until the body becomes less sensitive to the allergen. Make sure you follow up with your health care provider if problems continue. HOME CARE INSTRUCTIONS It is not possible to completely avoid allergens, but you can reduce your symptoms by taking steps to limit your exposure to them. It helps to know exactly what you are allergic to so that you can avoid your specific triggers. SEEK MEDICAL CARE IF:  You have a fever.  You develop a cough that does not stop easily (persistent).  You have shortness of breath.  You start wheezing.  Symptoms interfere with normal daily activities.   This information is not intended to replace advice given to you by your health care provider. Make sure you discuss any questions you have with your health care provider.   Document Released: 07/01/2001 Document Revised: 10/27/2014 Document Reviewed: 06/13/2013 Elsevier Interactive Patient Education 2016 [place]. Otitis Media With Effusion Otitis media with effusion is the presence of fluid in the middle ear. This is a common problem in children, which often follows ear infections. It may be present for weeks or longer after the infection. Unlike an acute ear infection, otitis media with effusion refers only to fluid behind the ear drum and not infection. Children with repeated ear and sinus infections and allergy problems are the most likely to get otitis media with effusion. CAUSES  The most frequent cause of the fluid buildup is dysfunction of the eustachian tubes. These are the tubes that drain fluid  in the ears to the back of the nose (nasopharynx). °SYMPTOMS  °· The main symptom of this condition is hearing loss. As a result, you or your child may: °¨ Listen to the TV  at a loud volume. °¨ Not respond to questions. °¨ Ask "what" often when spoken to. °¨ Mistake or confuse one sound or word for another. °· There may be a sensation of fullness or pressure but usually not pain. °DIAGNOSIS  °· Your health care provider will diagnose this condition by examining you or your child's ears. °· Your health care provider may test the pressure in you or your child's ear with a tympanometer. °· A hearing test may be conducted if the problem persists. °TREATMENT  °· Treatment depends on the duration and the effects of the effusion. °· Antibiotics, decongestants, nose drops, and cortisone-type drugs (tablets or nasal spray) may not be helpful. °· Children with persistent ear effusions may have delayed language or behavioral problems. Children at risk for developmental delays in hearing, learning, and speech may require referral to a specialist earlier than children not at risk. °· You or your child's health care provider may suggest a referral to an ear, nose, and throat surgeon for treatment. The following may help restore normal hearing: °¨ Drainage of fluid. °¨ Placement of ear tubes (tympanostomy tubes). °¨ Removal of adenoids (adenoidectomy). °HOME CARE INSTRUCTIONS  °· Avoid secondhand smoke. °· Infants who are breastfed are less likely to have this condition. °· Avoid feeding infants while they are lying flat. °· Avoid known environmental allergens. °· Avoid people who are sick. °SEEK MEDICAL CARE IF:  °· Hearing is not better in 3 months. °· Hearing is worse. °· Ear pain. °· Drainage from the ear. °· Dizziness. °MAKE SURE YOU:  °· Understand these instructions. °· Will watch your condition. °· Will get help right away if you are not doing well or get worse. °  °This information is not intended to replace advice given to you by your health care provider. Make sure you discuss any questions you have with your health care provider. °  °Document Released: 11/13/2004 Document Revised:  10/27/2014 Document Reviewed: 05/03/2013 °Elsevier Interactive Patient Education ©2016 [employer] °Sinusitis, Adult °Sinusitis is redness, soreness, and inflammation of the paranasal sinuses. Paranasal sinuses are air pockets within the bones of your face. They are located beneath your eyes, in the middle of your forehead, and above your eyes. In healthy paranasal sinuses, mucus is able to drain out, and air is able to circulate through them by way of your nose. However, when your paranasal sinuses are inflamed, mucus and air can become trapped. This can allow bacteria and other germs to grow and cause infection. °Sinusitis can develop quickly and last only a short time (acute) or continue over a long period (chronic). Sinusitis that lasts for more than 12 weeks is considered chronic. °CAUSES °Causes of sinusitis include: °· Allergies. °· Structural abnormalities, such as displacement of the cartilage that separates your nostrils (deviated septum), which can decrease the air flow through your nose and sinuses and affect sinus drainage. °· Functional abnormalities, such as when the small hairs (cilia) that line your sinuses and help remove mucus do not work properly or are not present. °SIGNS AND SYMPTOMS °Symptoms of acute and chronic sinusitis are the same. The primary symptoms are pain and pressure around the affected sinuses. Other symptoms include: °· Upper toothache. °· Earache. °· Headache. °· Bad breath. °· Decreased sense of smell and taste. °·   A cough, which worsens when you are lying flat.  Fatigue.  Fever.  Thick drainage from your nose, which often is green and may contain pus (purulent).  Swelling and warmth over the affected sinuses. DIAGNOSIS Your health care provider will perform a physical exam. During your exam, your health care provider may perform any of the following to help determine if you have acute sinusitis or chronic sinusitis:  Look in your nose for signs of abnormal  growths in your nostrils (nasal polyps).  Tap over the affected sinus to check for signs of infection.  View the inside of your sinuses using an imaging device that has a light attached (endoscope). If your health care provider suspects that you have chronic sinusitis, one or more of the following tests may be recommended:  Allergy tests.  Nasal culture. A sample of mucus is taken from your nose, sent to a lab, and screened for bacteria.  Nasal cytology. A sample of mucus is taken from your nose and examined by your health care provider to determine if your sinusitis is related to an allergy. TREATMENT Most cases of acute sinusitis are related to a viral infection and will resolve on their own within 10 days. Sometimes, medicines are prescribed to help relieve symptoms of both acute and chronic sinusitis. These may include pain medicines, decongestants, nasal steroid sprays, or saline sprays. However, for sinusitis related to a bacterial infection, your health care provider will prescribe antibiotic medicines. These are medicines that will help kill the bacteria causing the infection. Rarely, sinusitis is caused by a fungal infection. In these cases, your health care provider will prescribe antifungal medicine. For some cases of chronic sinusitis, surgery is needed. Generally, these are cases in which sinusitis recurs more than 3 times per year, despite other treatments. HOME CARE INSTRUCTIONS  Drink plenty of water. Water helps thin the mucus so your sinuses can drain more easily.  Use a humidifier.  Inhale steam 3-4 times a day (for example, sit in the bathroom with the shower running).  Apply a warm, moist washcloth to your face 3-4 times a day, or as directed by your health care provider.  Use saline nasal sprays to help moisten and clean your sinuses.  Take medicines only as directed by your health care provider.  If you were prescribed either an antibiotic or antifungal medicine,  finish it all even if you start to feel better. SEEK IMMEDIATE MEDICAL CARE IF:  You have increasing pain or severe headaches.  You have nausea, vomiting, or drowsiness.  You have swelling around your face.  You have vision problems.  You have a stiff neck.  You have difficulty breathing.   This information is not intended to replace advice given to you by your health care provider. Make sure you discuss any questions you have with your health care provider.   Document Released: 10/06/2005 Document Revised: 10/27/2014 Document Reviewed: 10/21/2011 Elsevier Interactive Patient Education 2016 [employer] Sinus Headache A sinus headache occurs when the paranasal sinuses become clogged or swollen. Paranasal sinuses are air pockets within the bones of the face. Sinus headaches can range from mild to severe. CAUSES A sinus headache can result from various conditions that affect the sinuses, such as:  Colds.  Sinus infections.  Allergies. SYMPTOMS The main symptom of this condition is a headache that may feel like pain or pressure in the face, forehead, ears, or upper teeth. People who have a sinus headache often have other symptoms, such as:  Congested or runny nose.  Fever.  Inability to smell. Weather changes can make symptoms worse. DIAGNOSIS This condition may be diagnosed based on:  A physical exam and medical history.  Imaging tests, such as a CT scan and MRI, to check for problems with the sinuses.  A specialist may look into the sinuses with a tool that has a camera (endoscopy). TREATMENT Treatment for this condition depends on the cause.  Sinus pain that is caused by a sinus infection may be treated with antibiotic medicine.  Sinus pain that is caused by allergies may be helped by allergy medicines (antihistamines) and medicated nasal sprays.  Sinus pain that is caused by congestion may be helped by flushing the nose and sinuses with saline solution. HOME  CARE INSTRUCTIONS  Take medicines only as directed by your health care provider.  If you were prescribed an antibiotic medicine, finish all of it even if you start to feel better.  If you have congestion, use a nasal spray to help reduce pressure.  If directed, apply a warm, moist washcloth to your face to help relieve pain. SEEK MEDICAL CARE IF:  You have headaches more than one time each week.  You have sensitivity to light or sound.  You have a fever.  You feel sick to your stomach (nauseous) or you throw up (vomit).  Your headaches do not get better with treatment. Many people think that they have a sinus headache when they actually have migraines or tension headaches. SEEK IMMEDIATE MEDICAL CARE IF:  You have vision problems.  You have sudden, severe pain in your face or head.  You have a seizure.  You are confused.  You have a stiff neck.   This information is not intended to replace advice given to you by your health care provider. Make sure you discuss any questions you have with your health care provider.   Document Released: 11/13/2004 Document Revised: 02/20/2015 Document Reviewed: 10/02/2014 Else[employer] Interactive Patient Education [employer]

## 2016-02-09 NOTE — ED Provider Notes (Signed)
CSN: [id number]     Arrival date & time 02/09/16  1503 History   First MD Initiated Contact with Patient 02/09/16 1530     Chief Complaint  Patient presents with  . Facial Pain   (Consider location/radiation/quality/duration/timing/severity/associated sxs/prior Treatment) HPI Comments: Caucasian female here for 8 days of sinus pressure, ears clogged, headache, nasal congestion motrin and sudafed not helping.  Teeth hurting drainage initially but now all clogged up.  Occasional cough with post nasal drip denied fever, chills, nausea, vomiting.  The history is provided by the patient.    Past Medical History  Diagnosis Date  . History of chicken pox   . Seizure, epileptic (HCC)     in childhood, prev on tegretol, off meds for years w/o symptoms  . Urinary tract bacterial infections   . Hypothyroidism     dx'd 2011  . Asthma     in childhood  . Endometriosis     dx'd in high school  . Eosinophilic pneumonia (HCC) ~08/2007    per Dr. Beverely Risen s/p VATS and 1 year of prednisone  . History of abnormal Pap smear    Past Surgical History  Procedure Laterality Date  . Cesarean section  2007  . Lung biopsy  2008  . Tonsillectomy and adenoidectomy     Family History  Problem Relation Age of Onset  . Mental illness Mother     bipolar  . Cancer Father     h/o throat cancer- treated  . Heart disease Maternal Grandfather     at ate >60  . Alcohol abuse Maternal Grandfather    Social History  Substance Use Topics  . Smoking status: Former Smoker    Types: Cigarettes  . Smokeless tobacco: None     Comment: quit x 6 yrs ago.  . Alcohol Use: Yes     Comment: rare   OB History    No data available     Review of Systems  Constitutional: Negative for fever, chills, diaphoresis, activity change, appetite change, fatigue and unexpected weight change.  HENT: Positive for congestion, ear pain, postnasal drip, rhinorrhea, sinus pressure and sore throat. Negative for dental problem,  drooling, ear discharge, facial swelling, hearing loss, mouth sores, nosebleeds, sneezing, tinnitus, trouble swallowing and voice change.   Eyes: Negative for photophobia, pain, discharge, redness, itching and visual disturbance.  Respiratory: Negative for cough, choking, chest tightness, shortness of breath, wheezing and stridor.   Cardiovascular: Negative for chest pain, palpitations and leg swelling.  Gastrointestinal: Negative for nausea, vomiting, abdominal pain, diarrhea, constipation, blood in stool and abdominal distention.  Endocrine: Negative for cold intolerance and heat intolerance.  Genitourinary: Negative for dysuria, hematuria and difficulty urinating.  Musculoskeletal: Negative for myalgias, back pain, joint swelling, arthralgias, gait problem, neck pain and neck stiffness.  Skin: Negative for color change, pallor, rash and wound.  Allergic/Immunologic: Positive for environmental allergies. Negative for food allergies.  Neurological: Positive for headaches. Negative for dizziness, tremors, seizures, syncope, facial asymmetry, speech difficulty, weakness, light-headedness and numbness.  Hematological: Negative for adenopathy. Does not bruise/bleed easily.  Psychiatric/Behavioral: Negative for behavioral problems, confusion, sleep disturbance and agitation.    Allergies  Review of patient's allergies indicates no known allergies.  Home Medications   Prior to Admission medications   Medication Sig Start Date End Date Taking? Authorizing Provider  albuterol (PROAIR HFA) 108 (90 BASE) MCG/ACT inhaler Inhale 1-2 puffs into the lungs every 6 (six) hours as needed. 10/24/14  Yes Joaquim Nam, MD  levothyroxine (SYNTHROID, LEVOTHROID) 125 MCG tablet 1 tablet by mouth once daily except for 1.5 tablets by mouth on Sundays 01/13/16  Yes Joaquim Nam, MD  amoxicillin-clavulanate (AUGMENTIN) 875-125 MG tablet Take 1 tablet by mouth every 12 (twelve) hours. 02/09/16   Barbaraann Barthel, NP   fluticasone (FLONASE) 50 MCG/ACT nasal spray Place 1 spray into both nostrils 2 (two) times daily. 02/09/16   Barbaraann Barthel, NP  ibuprofen (ADVIL,MOTRIN) 800 MG tablet Take 1 tablet (800 mg total) by mouth 3 (three) times daily. 02/09/16   Barbaraann Barthel, NP  oxymetazoline (AFRIN NASAL SPRAY) 0.05 % nasal spray Place 1 spray into both nostrils 2 (two) times daily. Max 3 days use 02/09/16   Barbaraann Barthel, NP  pseudoephedrine (SUDAFED) 30 MG tablet Take 1 tablet (30 mg total) by mouth every 4 (four) hours as needed for congestion. Max 8 tabs/240mg  per 24 hours 02/09/16   Barbaraann Barthel, NP  sodium chloride (OCEAN) 0.65 % SOLN nasal spray Place 2 sprays into both nostrils every 2 (two) hours while awake. 02/09/16   Barbaraann Barthel, NP   Meds Ordered and Administered this Visit  Medications - No data to display  BP 101/71 mmHg  Pulse 77  Temp(Src) 98.1 F (36.7 C) (Oral)  Ht  (1.575 m)  Wt 183 lb (83.008 kg)  BMI 33.46 kg/m2  SpO2 100%  LMP 01/30/2016 No data found.   Physical Exam  Constitutional: She is oriented to person, place, and time. Vital signs are normal. She appears well-developed and well-nourished. She is active and cooperative.  Non-toxic appearance. She does not have a sickly appearance. She appears ill. No distress.  HENT:  Head: Normocephalic and atraumatic.  Right Ear: Hearing, external ear and ear canal normal. A middle ear effusion is present.  Left Ear: Hearing, external ear and ear canal normal. A middle ear effusion is present.  Nose: Mucosal edema and rhinorrhea present. No nose lacerations, sinus tenderness, nasal deformity, septal deviation or nasal septal hematoma. No epistaxis.  No foreign bodies. Right sinus exhibits maxillary sinus tenderness and frontal sinus tenderness. Left sinus exhibits maxillary sinus tenderness and frontal sinus tenderness.  Mouth/Throat: Uvula is midline and mucous membranes are normal. Mucous membranes are not pale, not  dry and not cyanotic. She does not have dentures. No oral lesions. No trismus in the jaw. Normal dentition. No dental abscesses, uvula swelling, lacerations or dental caries. Posterior oropharyngeal edema and posterior oropharyngeal erythema present. No oropharyngeal exudate or tonsillar abscesses.  Cobblestoning posterior pharynx; bilateral nasal turbinates with edema/erythema clear discharge; nares with irritation/abrasion distal/on upper lip; bilateral TMs with air fluid level clear  Eyes: Conjunctivae, EOM and lids are normal. Pupils are equal, round, and reactive to light. Right eye exhibits no chemosis, no discharge, no exudate and no hordeolum. No foreign body present in the right eye. Left eye exhibits no chemosis, no discharge, no exudate and no hordeolum. No foreign body present in the left eye. Right conjunctiva is not injected. Right conjunctiva has no hemorrhage. Left conjunctiva is not injected. Left conjunctiva has no hemorrhage. No scleral icterus. Right eye exhibits normal extraocular motion and no nystagmus. Left eye exhibits normal extraocular motion and no nystagmus. Right pupil is round and reactive. Left pupil is round and reactive. Pupils are equal.  Neck: Trachea normal and normal range of motion. Neck supple. No tracheal tenderness, no spinous process tenderness and no muscular tenderness present. No rigidity. No tracheal deviation, no edema, no  erythema and normal range of motion present. No thyroid mass and no thyromegaly present.  Cardiovascular: Normal rate, regular rhythm, S1 normal, S2 normal, normal heart sounds and intact distal pulses.  PMI is not displaced.  Exam reveals no gallop and no friction rub.   No murmur heard. Pulmonary/Chest: Effort normal and breath sounds normal. No accessory muscle usage or stridor. No respiratory distress. She has no decreased breath sounds. She has no wheezes. She has no rhonchi. She has no rales. She exhibits no tenderness.  Abdominal: Soft.  She exhibits no distension.  Musculoskeletal: Normal range of motion. She exhibits no edema or tenderness.       Right shoulder: Normal.       Left shoulder: Normal.       Right hip: Normal.       Left hip: Normal.       Right knee: Normal.       Left knee: Normal.       Cervical back: Normal.       Right hand: Normal.       Left hand: Normal.  Lymphadenopathy:       Head (right side): No submental, no submandibular, no tonsillar, no preauricular, no posterior auricular and no occipital adenopathy present.       Head (left side): No submental, no submandibular, no tonsillar, no preauricular, no posterior auricular and no occipital adenopathy present.    She has no cervical adenopathy.       Right cervical: No superficial cervical, no deep cervical and no posterior cervical adenopathy present.      Left cervical: No superficial cervical, no deep cervical and no posterior cervical adenopathy present.  Neurological: She is alert and oriented to person, place, and time. She has normal strength. She is not disoriented. She displays no atrophy and no tremor. No cranial nerve deficit or sensory deficit. She exhibits normal muscle tone. She displays no seizure activity. Coordination and gait normal. GCS eye subscore is 4. GCS verbal subscore is 5. GCS motor subscore is 6.  Skin: Skin is warm and dry. Abrasion and rash noted. No bruising, no burn, no ecchymosis, no laceration, no lesion and no petechiae noted. Rash is macular. She is not diaphoretic. No cyanosis or erythema. No pallor. Nails show no clubbing.     Psychiatric: She has a normal mood and affect. Her speech is normal and behavior is normal. Judgment and thought content normal. Cognition and memory are normal.  Nursing note and vitals reviewed.   ED Course  Procedures (including critical care time)  Labs Review Labs Reviewed - No data to display  Imaging Review No results found.   MDM   1. Acute maxillary sinusitis, recurrence  not specified   2. Otitis media with effusion, bilateral   3. Rhinitis due to pollen   flonase 1 spray each nostril BID, saline 2 sprays each nostril q2h prn congestion.  If no improvement with 48 hours of saline and flonase use start augmentin 875mg  po BID x 10 days.  Rx given.  No evidence of systemic bacterial infection, non toxic and well hydrated.  I do not see where any further testing or imaging is necessary at this time.   I will suggest supportive care, rest, good hygiene and encourage the patient to take adequate fluids.  The patient is to return to clinic or EMERGENCY ROOM if symptoms worsen or change significantly.  Exitcare handout on sinusitis given to patient.  Patient verbalized agreement and understanding of  treatment plan and had no further questions at this time.   P2:  Hand washing and cover cough  Supportive treatment.   No evidence of invasive bacterial infection, non toxic and well hydrated.  This is most likely self limiting viral infection.  I do not see where any further testing or imaging is necessary at this time.   I will suggest supportive care, rest, good hygiene and encourage the patient to take adequate fluids.  The patient is to return to clinic or EMERGENCY ROOM if symptoms worsen or change significantly e.g. ear pain, fever, purulent discharge from ears or bleeding.  Exitcare handout on otitis media with effusion given to patient.  Patient verbalized agreement and understanding of treatment plan.    Patient may use normal saline nasal spray as needed.  Consider antihistamine.  Flonase 1 spray each nostril BID and may continue sudafed 30mg  po q4-6h prn rhinitis max 8 tabs/240mg  per 24 hours.  Avoid triggers if possible.  Shower prior to bedtime if exposed to triggers.  If allergic dust/dust mites recommend mattress/pillow covers/encasements; washing linens, vacuuming, sweeping, dusting weekly.  Call or return to clinic as needed if these symptoms worsen or fail to improve  as anticipated.   Exitcare handout on allergic rhinitis given to patient.  Patient verbalized understanding of instructions, agreed with plan of care and had no further questions at this time.  P2:  Avoidance and hand washing.    Barbaraann Barthelina A Betancourt, NP 02/09/16 1555

## 2016-03-03 ENCOUNTER — Other Ambulatory Visit: Payer: Self-pay | Admitting: *Deleted

## 2016-03-03 DIAGNOSIS — E039 Hypothyroidism, unspecified: Secondary | ICD-10-CM

## 2016-03-03 MED ORDER — LEVOTHYROXINE SODIUM 125 MCG PO TABS: ORAL_TABLET | ORAL | Status: DC

## 2016-03-03 NOTE — Telephone Encounter (Signed)
Faxed refill request. Last Filled:   100 tabs on 11/29/15.  Labs:  01/11/16.  Upcoming appt scheduled.  03/18/16.  No CPE in some time.  Please advise.

## 2016-03-03 NOTE — Telephone Encounter (Signed)
Sent.  Has f/u labs pending.  Thanks.

## 2016-03-18 ENCOUNTER — Other Ambulatory Visit (INDEPENDENT_AMBULATORY_CARE_PROVIDER_SITE_OTHER): Payer: 59

## 2016-03-18 DIAGNOSIS — E039 Hypothyroidism, unspecified: Secondary | ICD-10-CM

## 2016-03-18 LAB — TSH: TSH: 0.82 u[IU]/mL (ref 0.35–4.50)

## 2016-07-29 ENCOUNTER — Ambulatory Visit (INDEPENDENT_AMBULATORY_CARE_PROVIDER_SITE_OTHER): Payer: 59 | Admitting: Family Medicine

## 2016-07-29 ENCOUNTER — Encounter: Payer: Self-pay | Admitting: Family Medicine

## 2016-07-29 VITALS — BP 102/66 | HR 78 | Temp 97.9°F | Wt 183.5 lb

## 2016-07-29 DIAGNOSIS — R140 Abdominal distension (gaseous): Secondary | ICD-10-CM

## 2016-07-29 LAB — COMPREHENSIVE METABOLIC PANEL
ALBUMIN: 4 g/dL (ref 3.5–5.2)
ALT: 8 U/L (ref 0–35)
AST: 17 U/L (ref 0–37)
Alkaline Phosphatase: 60 U/L (ref 39–117)
BUN: 8 mg/dL (ref 6–23)
CALCIUM: 9.5 mg/dL (ref 8.4–10.5)
CHLORIDE: 105 meq/L (ref 96–112)
CO2: 29 meq/L (ref 19–32)
Creatinine, Ser: 0.77 mg/dL (ref 0.40–1.20)
GFR: 91.57 mL/min (ref >60.00)
Glucose, Bld: 91 mg/dL (ref 70–99)
POTASSIUM: 4.4 meq/L (ref 3.5–5.1)
Sodium: 139 mEq/L (ref 135–145)
Total Bilirubin: 0.3 mg/dL (ref 0.2–1.2)
Total Protein: 7.5 g/dL (ref 6.0–8.3)

## 2016-07-29 LAB — CBC WITH DIFFERENTIAL/PLATELET
BASOS PCT: 0.3 % (ref 0.0–3.0)
Basophils Absolute: 0 10*3/uL (ref 0.0–0.1)
EOS ABS: 0.2 10*3/uL (ref 0.0–0.7)
EOS PCT: 3.4 % (ref 0.0–5.0)
HEMATOCRIT: 32.3 % — AB (ref 36.0–46.0)
HEMOGLOBIN: 10.1 g/dL — AB (ref 12.0–15.0)
LYMPHS PCT: 21.7 % (ref 12.0–46.0)
Lymphs Abs: 1.5 10*3/uL (ref 0.7–4.0)
MCHC: 31.2 g/dL (ref 30.0–36.0)
MONOS PCT: 6.4 % (ref 3.0–12.0)
Monocytes Absolute: 0.4 10*3/uL (ref 0.1–1.0)
NEUTROS ABS: 4.6 10*3/uL (ref 1.4–7.7)
Neutrophils Relative %: 68.2 % (ref 43.0–77.0)
PLATELETS: 307 10*3/uL (ref 150.0–400.0)
RBC: 4.7 Mil/uL (ref 3.87–5.11)
RDW: 19.3 % — AB (ref 11.5–15.5)
WBC: 6.8 10*3/uL (ref 4.0–10.5)

## 2016-07-29 LAB — LIPASE: Lipase: 26 U/L (ref 11.0–59.0)

## 2016-07-29 NOTE — Patient Instructions (Addendum)
Go to the lab on the way out.  We'll contact you with your lab report. Shirlee LimerickMarion will call about your referral. See if fatty foods make a difference.  Take care.  Glad to see you.

## 2016-07-29 NOTE — Progress Notes (Signed)
Pre visit review using our clinic review tool, if applicable. No additional management support is needed unless otherwise documented below in the visit note. 

## 2016-07-29 NOTE — Progress Notes (Signed)
Abd sx.  She has R sided sx near the rib cage.  Bloated feeling but not a pain.  Variable level of pressure in the area.  Going on for about 3 weeks.  Sometimes she has less sx than others, unclear if it totally goes away ever.  Has been constant for a few days now.  Once she was dizzy for about 1 day but that resolved, had room spinning.  No FCNAVD.  No juandice.  No icterus.  No chest sx, not SOB.  Pressure is worse after meals, possibly worse with fatty meals.   tums didn't help.   Some eczema on the hands.  Tried OTC cream but not yet tried hydrocortisone.    PMH and SH reviewed  ROS: Per HPI unless specifically indicated in ROS section   Meds, vitals, and allergies reviewed.   GEN: nad, alert and oriented HEENT: mucous membranes moist NECK: supple w/o LA CV: rrr. PULM: ctab, no inc wob ABD: soft, +bs EXT: no edema SKIN: no acute rash but mild eczematous changes on the hands noted.

## 2016-07-30 ENCOUNTER — Encounter: Payer: Self-pay | Admitting: Family Medicine

## 2016-07-30 DIAGNOSIS — R140 Abdominal distension (gaseous): Secondary | ICD-10-CM | POA: Insufficient documentation

## 2016-07-30 NOTE — Assessment & Plan Note (Addendum)
She did not improve with Tums. She has right upper quadrant symptoms, bloating but not pain. She has a benign abdominal exam today. She has no emergent symptoms. She will try to see if she notices a change with fatty meals. In the meantime check basic labs. See notes on labs. We will set up an abdominal ultrasound to evaluate her right upper quadrant. She agrees. Okay for outpatient follow-up. Rationale and anatomy discussed with patient.

## 2016-07-31 ENCOUNTER — Other Ambulatory Visit: Payer: Self-pay | Admitting: Family Medicine

## 2016-07-31 DIAGNOSIS — D649 Anemia, unspecified: Secondary | ICD-10-CM | POA: Insufficient documentation

## 2016-08-04 ENCOUNTER — Ambulatory Visit
Admission: RE | Admit: 2016-08-04 | Discharge: 2016-08-04 | Disposition: A | Discharge status: Home / Self Care | Payer: 59 | Source: Ambulatory Visit | Attending: Family Medicine | Admitting: Family Medicine

## 2016-08-04 DIAGNOSIS — R140 Abdominal distension (gaseous): Secondary | ICD-10-CM

## 2016-08-04 DIAGNOSIS — K8020 Calculus of gallbladder without cholecystitis without obstruction: Secondary | ICD-10-CM | POA: Insufficient documentation

## 2016-08-05 ENCOUNTER — Other Ambulatory Visit: Payer: Self-pay | Admitting: Family Medicine

## 2016-08-05 DIAGNOSIS — K8020 Calculus of gallbladder without cholecystitis without obstruction: Secondary | ICD-10-CM | POA: Insufficient documentation

## 2016-08-07 ENCOUNTER — Encounter: Payer: Self-pay | Admitting: Surgery

## 2016-08-07 ENCOUNTER — Ambulatory Visit (INDEPENDENT_AMBULATORY_CARE_PROVIDER_SITE_OTHER): Payer: 59 | Admitting: Surgery

## 2016-08-07 VITALS — BP 111/71 | HR 65 | Temp 98.0°F | Ht 62.0 in | Wt 184.0 lb

## 2016-08-07 DIAGNOSIS — K8020 Calculus of gallbladder without cholecystitis without obstruction: Secondary | ICD-10-CM

## 2016-08-07 NOTE — Progress Notes (Signed)
Patient ID: Bridget BiblesJennifer C [name], female   DOB: 1981-05-26, 35 y.o.   MRN: [id number]  HPI Bridget BiblesJennifer C [name] is a 35 y.o. female seen in consultation for Symptomatic GS. Referred by Dr. Para Marchuncan. She reports that over the last couple weeks she had Carney LivingSpeer is some bloating, some pressure on the right upper quadrant and nausea. She reports that these symptoms are exacerbated after she eats but there is no specific relationship with heavy or fatty meals. No evidence of biliary obstruction, no evidence of abdominal pain, no fevers no chills. Part of her workup has included a CMP that is unremarkable and ultrasound that I have personally reviewed showing evidence of cholelithiasis without cholecystitis and a normal common bile duct. She recently was diagnosed with anemia and current workup is ongoing. She has never had a EGD or colonoscopy yet and she denies any evidence of any active bleeding at this time. HX Right VATS  w lung bx, and C section.   HPI  Past Medical History:  Diagnosis Date  . Asthma    in childhood  . Endometriosis    dx'd in high school  . Eosinophilic pneumonia (HCC) ~08/2007   per Dr. Beverely RisenFozia Khan s/p VATS and 1 year of prednisone  . History of abnormal Pap smear   . History of chicken pox   . Hypothyroidism    dx'd 2011  . Seizure, epileptic (HCC)    in childhood, prev on tegretol, off meds for years w/o symptoms  . Urinary tract bacterial infections     Past Surgical History:  Procedure Laterality Date  . CESAREAN SECTION  2007  . LUNG BIOPSY  2008  . TONSILLECTOMY AND ADENOIDECTOMY      Family History  Problem Relation Age of Onset  . Mental illness Mother     bipolar  . Cancer Father     h/o throat cancer- treated  . Heart disease Maternal Grandfather     at ate >60  . Alcohol abuse Maternal Grandfather     Social History Social History  Substance Use Topics  . Smoking status: Former Smoker    Types: Cigarettes  . Smokeless tobacco: Never Used   Comment: quit x 6 yrs ago.  . Alcohol use Yes     Comment: rare    No Known Allergies  Current Outpatient Prescriptions  Medication Sig Dispense Refill  . albuterol (PROAIR HFA) 108 (90 BASE) MCG/ACT inhaler Inhale 1-2 puffs into the lungs every 6 (six) hours as needed. 18 g 2  . ibuprofen (ADVIL,MOTRIN) 800 MG tablet Take 1 tablet (800 mg total) by mouth 3 (three) times daily. 21 tablet 0  . levothyroxine (SYNTHROID, LEVOTHROID) 125 MCG tablet 1 tablet by mouth once daily except for 1.5 tablets by mouth on Sundays 100 tablet 1   No current facility-administered medications for this visit.      Review of Systems A 10 point review of systems was asked and was negative except for the information on the HPI  Physical Exam Blood pressure 111/71, pulse 65, temperature 98 F (36.7 C), temperature source Oral, height 5\' 2"  (1.575 m), weight 83.5 kg (184 lb). CONSTITUTIONAL: NAD awake alert EYES: Pupils are equal, round, and reactive to light, Sclera are non-icteric. EARS, NOSE, MOUTH AND THROAT: The oropharynx is clear. The oral mucosa is pink and moist. Hearing is intact to voice. LYMPH NODES:  Lymph nodes in the neck are normal. RESPIRATORY:  Lungs are clear. There is normal respiratory effort, with equal breath sounds  bilaterally, and without pathologic use of accessory muscles. CARDIOVASCULAR: Heart is regular without murmurs, gallops, or rubs. GI: The abdomen is soft, nontender, and nondistended. There are no palpable masses. There is no hepatosplenomegaly. There are normal bowel sounds in all quadrants. GU: Rectal deferred.   MUSCULOSKELETAL: Normal muscle strength and tone. No cyanosis or edema.   SKIN: Turgor is good and there are no pathologic skin lesions or ulcers. NEUROLOGIC: Motor and sensation is grossly normal. Cranial nerves are grossly intact. PSYCH:  Oriented to person, place and time. Affect is normal.  Data Reviewed I have personally reviewed the patient's imaging,  laboratory findings and medical records.    Assessment Plan 35 year old female with symptomatic cholelithiasis. There is no need for any emergent surgical intervention. I do suggest finishing up her workup of anemia before cholecystectomy is performed. I will see her back in about 3 weeks and reassess her. I do think that she will eventually benefit from laparoscopic cholecystectomy. Discussed with patient in detail about her disease process and about the proposed surgery. Risk, benefits and possible complications including but not limited to: Bleeding, infection, bile duct injury, and duration of symptoms. She understands.   Sterling Big, MD FACS General Surgeon 08/07/2016, 11:47 AM

## 2016-08-07 NOTE — Patient Instructions (Signed)
Please see your follow up appointment listed below. Please call our office if you have any questions or concerns. 

## 2016-08-13 ENCOUNTER — Other Ambulatory Visit (INDEPENDENT_AMBULATORY_CARE_PROVIDER_SITE_OTHER): Payer: 59

## 2016-08-13 DIAGNOSIS — D649 Anemia, unspecified: Secondary | ICD-10-CM | POA: Diagnosis not present

## 2016-08-13 LAB — FECAL OCCULT BLOOD, IMMUNOCHEMICAL: FECAL OCCULT BLD: NEGATIVE

## 2016-08-27 ENCOUNTER — Ambulatory Visit: Payer: Self-pay | Admitting: Surgery

## 2016-08-28 ENCOUNTER — Other Ambulatory Visit: Payer: Self-pay | Admitting: Family Medicine

## 2016-09-02 ENCOUNTER — Ambulatory Visit: Admission: EM | Admit: 2016-09-02 | Discharge: 2016-09-02 | Discharge status: Absent without leave | Payer: 59

## 2016-09-03 ENCOUNTER — Encounter: Payer: Self-pay | Admitting: Family Medicine

## 2016-09-03 ENCOUNTER — Ambulatory Visit (INDEPENDENT_AMBULATORY_CARE_PROVIDER_SITE_OTHER): Payer: 59 | Admitting: Family Medicine

## 2016-09-03 DIAGNOSIS — D649 Anemia, unspecified: Secondary | ICD-10-CM | POA: Diagnosis not present

## 2016-09-03 DIAGNOSIS — R05 Cough: Secondary | ICD-10-CM

## 2016-09-03 DIAGNOSIS — R059 Cough, unspecified: Secondary | ICD-10-CM

## 2016-09-03 MED ORDER — AZITHROMYCIN 250 MG PO TABS: ORAL_TABLET | ORAL | 0 refills | Status: DC

## 2016-09-03 NOTE — Patient Instructions (Addendum)
Go to the lab on the way out.  We'll contact you with your lab report. Start zithromax, continue inhaler but 1 puff instead of two.   Rest and fluids.   Update me as needed.  Take care.  Glad to see you.

## 2016-09-03 NOTE — Progress Notes (Signed)
Anemia.  She can take iron about every other day or she'll have an upset stomach.  D/w pt about rechecking labs today.  See notes on labs.    She has f/u with surgery pending re: gallstones.   She had a cold with typical URI sx.  All of it was getting better except for the cough.  Cough is better in the day, excluding the early AM.   Still with some sputum production in the AM.  Still coughing a lot at night.  No fevers.  Sputum is greenish in the AM.  Using inhaler helps some but she notes inc in heart rate.  Some wheeze, better with SABA.  She failed tx with mucinex.    Meds, vitals, and allergies reviewed.   ROS: Per HPI unless specifically indicated in ROS section   GEN: nad, alert and oriented HEENT: mucous membranes moist NECK: supple w/o LA CV: rrr.  PULM: ctab, no inc wob, no wheeze.   ABD: soft, +bs

## 2016-09-03 NOTE — Progress Notes (Signed)
Pre visit review using our clinic review tool, if applicable. No additional management support is needed unless otherwise documented below in the visit note. 

## 2016-09-04 ENCOUNTER — Other Ambulatory Visit: Payer: Self-pay | Admitting: Family Medicine

## 2016-09-04 DIAGNOSIS — D649 Anemia, unspecified: Secondary | ICD-10-CM

## 2016-09-04 LAB — CBC WITH DIFFERENTIAL/PLATELET
BASOS PCT: 0.5 % (ref 0.0–3.0)
Basophils Absolute: 0 10*3/uL (ref 0.0–0.1)
EOS PCT: 2.7 % (ref 0.0–5.0)
Eosinophils Absolute: 0.2 10*3/uL (ref 0.0–0.7)
HCT: 34.1 % — ABNORMAL LOW (ref 36.0–46.0)
Hemoglobin: 10.8 g/dL — ABNORMAL LOW (ref 12.0–15.0)
LYMPHS ABS: 1.7 10*3/uL (ref 0.7–4.0)
Lymphocytes Relative: 23.8 % (ref 12.0–46.0)
MCHC: 31.8 g/dL (ref 30.0–36.0)
MCV: 70.8 fl — ABNORMAL LOW (ref 78.0–100.0)
MONO ABS: 0.5 10*3/uL (ref 0.1–1.0)
Monocytes Relative: 7 % (ref 3.0–12.0)
NEUTROS PCT: 66 % (ref 43.0–77.0)
Neutro Abs: 4.7 10*3/uL (ref 1.4–7.7)
Platelets: 312 10*3/uL (ref 150.0–400.0)
RBC: 4.82 Mil/uL (ref 3.87–5.11)
RDW: 19.4 % — AB (ref 11.5–15.5)
WBC: 7.1 10*3/uL (ref 4.0–10.5)

## 2016-09-04 LAB — IBC PANEL
IRON: 23 ug/dL — AB (ref 42–145)
Saturation Ratios: 4.6 % — ABNORMAL LOW (ref 20.0–50.0)
Transferrin: 354 mg/dL (ref 212.0–360.0)

## 2016-09-04 NOTE — Assessment & Plan Note (Signed)
Given the duration, sputum, and her history it would make sense to treat. Start Zithromax. Nontoxic. Follow-up as needed. See after visit summary. She agrees.

## 2016-09-04 NOTE — Assessment & Plan Note (Signed)
Previously iron deficient. Taking iron every other day. This is the maximum dose she can tolerate. List of iron rich foods given to patient. Recheck labs. Previous IFOB is negative. All discussed with patient. See notes on labs.

## 2016-09-08 ENCOUNTER — Ambulatory Visit (INDEPENDENT_AMBULATORY_CARE_PROVIDER_SITE_OTHER): Payer: 59 | Admitting: Surgery

## 2016-09-08 ENCOUNTER — Encounter: Payer: Self-pay | Admitting: Surgery

## 2016-09-08 VITALS — BP 120/68 | HR 74 | Temp 98.3°F | Ht 62.0 in | Wt 188.0 lb

## 2016-09-08 DIAGNOSIS — K8020 Calculus of gallbladder without cholecystitis without obstruction: Secondary | ICD-10-CM

## 2016-09-08 NOTE — Patient Instructions (Signed)
We have scheduled your surgery for 10/29/15 with Dr.Pabon. Please see blue pre-care sheet for surgery information. Please call our office if you have questions or concerns.

## 2016-09-08 NOTE — Progress Notes (Signed)
Patient ID: [name], female   DOB: Feb 28, 1981, 35 y.o.   MRN: [id number]  HPI [name] is a 35 y.o. female  Following for sympt GS. She has had anemia w/u revealing iron def. She is on treatment and her HB has been stable/    She still  reports having some bloating, some pressure on the right upper quadrant and nausea. She reports that these symptoms are exacerbated after she eats but there is no specific relationship with heavy or fatty meals. No evidence of biliary obstruction, no evidence of abdominal pain, no fevers no chills. Part of her workup has included a CMP that is unremarkable and ultrasound that I have personally reviewed showing evidence of cholelithiasis without cholecystitis and a normal common bile duct.HX Right VATS  w lung bx, and C section. No evidence of biliary obstruction or cholangitis. She has good cv performance and is able to do more than 4 METS w/o SOB or C/p. She is currently being treated for resp infection and is on antibiotics  HPI  Past Medical History:  Diagnosis Date  . Anemia    Iron Deficiency  . Asthma    in childhood  . Endometriosis    dx'd in high school  . Eosinophilic pneumonia (HCC) ~08/2007   per Dr. Beverely RisenFozia Khan s/p VATS and 1 year of prednisone  . History of abnormal Pap smear   . History of chicken pox   . Hypothyroidism    dx'd 2011  . Seizure, epileptic (HCC)    in childhood, prev on tegretol, off meds for years w/o symptoms  . Urinary tract bacterial infections     Past Surgical History:  Procedure Laterality Date  . CESAREAN SECTION  2007  . LUNG BIOPSY  2008  . TONSILLECTOMY AND ADENOIDECTOMY      Family History  Problem Relation Age of Onset  . Mental illness Mother     bipolar  . Cancer Father     h/o throat cancer- treated  . Heart disease Maternal Grandfather     at ate >60  . Alcohol abuse Maternal Grandfather     Social History Social History  Substance Use Topics  . Smoking status:  Former Smoker    Types: Cigarettes  . Smokeless tobacco: Never Used     Comment: quit x 6 yrs ago.  . Alcohol use Yes     Comment: rare    No Known Allergies  Current Outpatient Prescriptions  Medication Sig Dispense Refill  . ferrous sulfate 325 (65 FE) MG tablet Take 325 mg by mouth every other day.    . ibuprofen (ADVIL,MOTRIN) 800 MG tablet Take 1 tablet (800 mg total) by mouth 3 (three) times daily. 21 tablet 0  . levothyroxine (SYNTHROID, LEVOTHROID) 125 MCG tablet 1 tablet by mouth once daily except for 1.5 tablets by mouth on Sundays 100 tablet 1  . PROAIR HFA 108 (90 Base) MCG/ACT inhaler inhale 1-2 puffs by mouth every 6 hours if needed 17 Inhaler 2   No current facility-administered medications for this visit.      Review of Systems A 10 point review of systems was asked and was negative except for the information on the HPI  Physical Exam Blood pressure 120/68, pulse 74, temperature 98.3 F (36.8 C), temperature source Oral, height 5\' 2"  (1.575 m), weight 85.3 kg (188 lb), last menstrual period 08/24/2016. CONSTITUTIONAL: NAD  EYES: Pupils are equal, round, and reactive to light, Sclera are non-icteric. EARS, NOSE, MOUTH  AND THROAT: The oropharynx is clear. The oral mucosa is pink and moist. Hearing is intact to voice. LYMPH NODES:  Lymph nodes in the neck are normal. RESPIRATORY:  Lungs are clear. There is normal respiratory effort, with equal breath sounds bilaterally, and without pathologic use of accessory muscles. CARDIOVASCULAR: Heart is regular without murmurs, gallops, or rubs. GI: The abdomen is soft, nontender, and nondistended. There are no palpable masses. There is no hepatosplenomegaly. There are normal bowel sounds in all quadrants. GU: Rectal deferred.   MUSCULOSKELETAL: Normal muscle strength and tone. No cyanosis or edema.   SKIN: Turgor is good and there are no pathologic skin lesions or ulcers. NEUROLOGIC: Motor and sensation is grossly normal.  Cranial nerves are grossly intact. PSYCH:  Oriented to person, place and time. Affect is normal.  Data Reviewed  I have personally reviewed the patient's imaging, laboratory findings and medical records.    Assessment/ Plan Symptomatic cholelithiasis, recommend lap chole. Since she has an active resp infection we will like to wait a few weeks. The risks, benefits, complications, treatment options, and expected outcomes were discussed with the patient. The possibilities of bleeding, recurrent infection, finding a normal gallbladder, perforation of viscus organs, damage to surrounding structures, bile leak, abscess formation, needing a drain placed, the need for additional procedures, reaction to medication, pulmonary aspiration,  failure to diagnose a condition, the possible need to convert to an open procedure, and creating a complication requiring transfusion or operation were discussed with the patient. The patient and/or family concurred with the proposed plan, giving informed consent.      Sterling Bigiego Pabon, MD FACS General Surgeon 09/08/2016, 10:51 AM

## 2016-09-10 ENCOUNTER — Telehealth: Payer: Self-pay | Admitting: Surgery

## 2016-09-10 NOTE — Telephone Encounter (Signed)
Pt advised of pre op date/time and sx date. Sx: 10/29/15 with Dr Pabon--Laparoscopic cholecystectomy.  Pre op: 10/21/16 between 9-1:00pm--Phone.   Patient made aware to call [phone], between 1-3:00pm the day before surgery, to find out what time to arrive.

## 2016-09-19 ENCOUNTER — Telehealth: Payer: Self-pay | Admitting: Surgery

## 2016-09-19 NOTE — Telephone Encounter (Signed)
Patient has been advised that her surgery date has been changed from 10/29/15 to 10/28/15 with Dr Pabon--Laparoscopic cholecystectomy.   Patient was ok with this change.

## 2016-09-22 ENCOUNTER — Other Ambulatory Visit: Payer: Self-pay | Admitting: Family Medicine

## 2016-09-22 DIAGNOSIS — E039 Hypothyroidism, unspecified: Secondary | ICD-10-CM

## 2016-10-20 HISTORY — PX: DIAGNOSTIC LAPAROSCOPY: SUR761

## 2016-10-21 ENCOUNTER — Encounter
Admission: RE | Admit: 2016-10-21 | Discharge: 2016-10-21 | Disposition: A | Discharge status: Home / Self Care | Payer: 59 | Source: Ambulatory Visit | Attending: Surgery | Admitting: Surgery

## 2016-10-21 HISTORY — DX: Headache: R51

## 2016-10-21 HISTORY — DX: Other specified postprocedural states: Z98.890

## 2016-10-21 HISTORY — DX: Unspecified convulsions: R56.9

## 2016-10-21 HISTORY — DX: Adverse effect of unspecified anesthetic, initial encounter: T41.45XA

## 2016-10-21 HISTORY — DX: Headache, unspecified: R51.9

## 2016-10-21 HISTORY — DX: Gastro-esophageal reflux disease without esophagitis: K21.9

## 2016-10-21 HISTORY — DX: Other complications of anesthesia, initial encounter: T88.59XA

## 2016-10-21 HISTORY — DX: Other specified postprocedural states: R11.2

## 2016-10-21 HISTORY — DX: Pneumonia, unspecified organism: J18.9

## 2016-10-21 NOTE — Patient Instructions (Signed)
  Your procedure is scheduled on: 10-27-16 Report to Same Day Surgery 2nd floor medical mall [facility](Medical Mall Entrance-take elevator on left to 2nd floor.  Check in with surgery information desk.) To find out your arrival time please call [phone] between 1PM - 3PM on 10-24-16  Remember: Instructions that are not followed completely may result in serious medical risk, up to and including death, or upon the discretion of your surgeon and anesthesiologist your surgery may need to be rescheduled.    _x___ 1. Do not eat food or drink liquids after midnight. No gum chewing or hard candies.     __x__ 2. No Alcohol for 24 hours before or after surgery.   __x__3. No Smoking for 24 prior to surgery.   ____  4. Bring all medications with you on the day of surgery if instructed.    __x__ 5. Notify your doctor if there is any change in your medical condition     (cold, fever, infections).     Do not wear jewelry, make-up, hairpins, clips or nail polish.  Do not wear lotions, powders, or perfumes. You may wear deodorant.  Do not shave 48 hours prior to surgery. Men may shave face and neck.  Do not bring valuables to the hospital.    [facility] is not responsible for any belongings or valuables.               Contacts, dentures or bridgework may not be worn into surgery.  Leave your suitcase in the car. After surgery it may be brought to your room.  For patients admitted to the hospital, discharge time is determined by your treatment team.   Patients discharged the day of surgery will not be allowed to drive home.  You will need someone to drive you home and stay with you the night of your procedure.    Please read over the following fact sheets that you were given:   [facility] Pre[facility] and or MRSA Information   _x___ Take these medicines the morning of surgery with A SIP OF WATER:    1. LEVOTHYROXINE  2.  3.  4.  5.  6.  ____Fleets enema or Magnesium Citrate as  directed.   ____ Use CHG Soap or sage wipes as directed on instruction sheet   _X___ Use inhalers on the day of surgery and bring to hospital day of surgery-USE ALBUTEROL INHALER AT HOME AND BRING TO HOSPITAL  ____ Stop metformin 2 days prior to surgery    ____ Take 1/2 of usual insulin dose the night before surgery and none on the morning of  surgery.   ____ Stop Aspirin, Coumadin, Pllavix ,Eliquis, Effient, or Pradaxa  x__ Stop Anti-inflammatories such as Advil, Aleve, Ibuprofen, Motrin, Naproxen,          Naprosyn, Goodies powders or aspirin products NOW-Ok to take Tylenol.   ____ Stop supplements until after surgery.    ____ Bring C-Pap to the hospital.

## 2016-10-24 ENCOUNTER — Encounter: Payer: Self-pay | Admitting: *Deleted

## 2016-10-26 MED ORDER — CEFAZOLIN SODIUM-DEXTROSE 2-4 GM/100ML-% IV SOLN
2.0000 g | INTRAVENOUS | Status: AC
  Administered 2016-10-27: 2 g via INTRAVENOUS

## 2016-10-27 ENCOUNTER — Encounter
Admission: RE | Disposition: A | Discharge status: Home / Self Care | Payer: Self-pay | Source: Ambulatory Visit | Attending: Surgery

## 2016-10-27 ENCOUNTER — Ambulatory Visit
Admission: RE | Admit: 2016-10-27 | Discharge: 2016-10-27 | Disposition: A | Discharge status: Home / Self Care | Payer: 59 | Source: Ambulatory Visit | Attending: Surgery | Admitting: Surgery

## 2016-10-27 ENCOUNTER — Encounter: Payer: Self-pay | Admitting: *Deleted

## 2016-10-27 ENCOUNTER — Ambulatory Visit: Payer: 59 | Admitting: Anesthesiology

## 2016-10-27 DIAGNOSIS — Z6834 Body mass index (BMI) 34.0-34.9, adult: Secondary | ICD-10-CM | POA: Insufficient documentation

## 2016-10-27 DIAGNOSIS — G40909 Epilepsy, unspecified, not intractable, without status epilepticus: Secondary | ICD-10-CM | POA: Insufficient documentation

## 2016-10-27 DIAGNOSIS — Z818 Family history of other mental and behavioral disorders: Secondary | ICD-10-CM | POA: Diagnosis not present

## 2016-10-27 DIAGNOSIS — E669 Obesity, unspecified: Secondary | ICD-10-CM | POA: Diagnosis not present

## 2016-10-27 DIAGNOSIS — Z87891 Personal history of nicotine dependence: Secondary | ICD-10-CM | POA: Diagnosis not present

## 2016-10-27 DIAGNOSIS — Z8249 Family history of ischemic heart disease and other diseases of the circulatory system: Secondary | ICD-10-CM | POA: Insufficient documentation

## 2016-10-27 DIAGNOSIS — Z79899 Other long term (current) drug therapy: Secondary | ICD-10-CM | POA: Insufficient documentation

## 2016-10-27 DIAGNOSIS — G43909 Migraine, unspecified, not intractable, without status migrainosus: Secondary | ICD-10-CM | POA: Insufficient documentation

## 2016-10-27 DIAGNOSIS — D509 Iron deficiency anemia, unspecified: Secondary | ICD-10-CM | POA: Insufficient documentation

## 2016-10-27 DIAGNOSIS — Z811 Family history of alcohol abuse and dependence: Secondary | ICD-10-CM | POA: Insufficient documentation

## 2016-10-27 DIAGNOSIS — K8010 Calculus of gallbladder with chronic cholecystitis without obstruction: Secondary | ICD-10-CM | POA: Insufficient documentation

## 2016-10-27 DIAGNOSIS — K8020 Calculus of gallbladder without cholecystitis without obstruction: Secondary | ICD-10-CM

## 2016-10-27 DIAGNOSIS — J45909 Unspecified asthma, uncomplicated: Secondary | ICD-10-CM | POA: Diagnosis not present

## 2016-10-27 DIAGNOSIS — Z800 Family history of malignant neoplasm of digestive organs: Secondary | ICD-10-CM | POA: Insufficient documentation

## 2016-10-27 DIAGNOSIS — N809 Endometriosis, unspecified: Secondary | ICD-10-CM | POA: Diagnosis not present

## 2016-10-27 DIAGNOSIS — E039 Hypothyroidism, unspecified: Secondary | ICD-10-CM | POA: Diagnosis not present

## 2016-10-27 DIAGNOSIS — K219 Gastro-esophageal reflux disease without esophagitis: Secondary | ICD-10-CM | POA: Insufficient documentation

## 2016-10-27 HISTORY — PX: CHOLECYSTECTOMY: SHX55

## 2016-10-27 LAB — POCT PREGNANCY, URINE: PREG TEST UR: NEGATIVE

## 2016-10-27 SURGERY — LAPAROSCOPIC CHOLECYSTECTOMY
Anesthesia: General | Wound class: Clean Contaminated

## 2016-10-27 MED ORDER — DEXAMETHASONE SODIUM PHOSPHATE 10 MG/ML IJ SOLN
INTRAMUSCULAR | Status: DC | PRN
  Administered 2016-10-27: 4 mg via INTRAVENOUS

## 2016-10-27 MED ORDER — FENTANYL CITRATE (PF) 100 MCG/2ML IJ SOLN
INTRAMUSCULAR | Status: DC | PRN
  Administered 2016-10-27: 50 ug via INTRAVENOUS
  Administered 2016-10-27: 25 ug via INTRAVENOUS
  Administered 2016-10-27: 100 ug via INTRAVENOUS

## 2016-10-27 MED ORDER — PHENYLEPHRINE 40 MCG/ML (10ML) SYRINGE FOR IV PUSH (FOR BLOOD PRESSURE SUPPORT)
PREFILLED_SYRINGE | INTRAVENOUS | Status: AC
  Filled 2016-10-27: qty 10

## 2016-10-27 MED ORDER — DEXAMETHASONE SODIUM PHOSPHATE 10 MG/ML IJ SOLN
INTRAMUSCULAR | Status: AC
  Filled 2016-10-27: qty 1

## 2016-10-27 MED ORDER — FAMOTIDINE 20 MG PO TABS
20.0000 mg | ORAL_TABLET | Freq: Once | ORAL | Status: AC
  Administered 2016-10-27: 20 mg via ORAL

## 2016-10-27 MED ORDER — PROMETHAZINE HCL 25 MG/ML IJ SOLN: 6.2500 mg | INTRAMUSCULAR | Status: DC | PRN

## 2016-10-27 MED ORDER — OXYCODONE HCL 5 MG PO TABS
ORAL_TABLET | ORAL | Status: AC
  Filled 2016-10-27: qty 1

## 2016-10-27 MED ORDER — FENTANYL CITRATE (PF) 100 MCG/2ML IJ SOLN
25.0000 ug | INTRAMUSCULAR | Status: DC | PRN
  Administered 2016-10-27: 50 ug via INTRAVENOUS
  Administered 2016-10-27: 25 ug via INTRAVENOUS
  Administered 2016-10-27: 50 ug via INTRAVENOUS

## 2016-10-27 MED ORDER — CHLORHEXIDINE GLUCONATE CLOTH 2 % EX PADS: 6.0000 | MEDICATED_PAD | Freq: Once | CUTANEOUS | Status: DC

## 2016-10-27 MED ORDER — PROPOFOL 10 MG/ML IV BOLUS
INTRAVENOUS | Status: DC | PRN
  Administered 2016-10-27: 160 mg via INTRAVENOUS

## 2016-10-27 MED ORDER — ACETAMINOPHEN 10 MG/ML IV SOLN
INTRAVENOUS | Status: AC
  Filled 2016-10-27: qty 100

## 2016-10-27 MED ORDER — FENTANYL CITRATE (PF) 100 MCG/2ML IJ SOLN
INTRAMUSCULAR | Status: AC
  Administered 2016-10-27: 25 ug via INTRAVENOUS
  Filled 2016-10-27: qty 2

## 2016-10-27 MED ORDER — OXYCODONE HCL 5 MG/5ML PO SOLN: 5.0000 mg | Freq: Once | ORAL | Status: AC | PRN

## 2016-10-27 MED ORDER — PHENYLEPHRINE HCL 10 MG/ML IJ SOLN
INTRAMUSCULAR | Status: DC | PRN
  Administered 2016-10-27: 80 ug via INTRAVENOUS

## 2016-10-27 MED ORDER — LACTATED RINGERS IV SOLN
INTRAVENOUS | Status: DC
  Administered 2016-10-27: 09:00:00 via INTRAVENOUS

## 2016-10-27 MED ORDER — ROCURONIUM BROMIDE 50 MG/5ML IV SOSY
PREFILLED_SYRINGE | INTRAVENOUS | Status: AC
  Filled 2016-10-27: qty 5

## 2016-10-27 MED ORDER — LIDOCAINE HCL (CARDIAC) 20 MG/ML IV SOLN
INTRAVENOUS | Status: DC | PRN
  Administered 2016-10-27: 80 mg via INTRAVENOUS

## 2016-10-27 MED ORDER — FENTANYL CITRATE (PF) 100 MCG/2ML IJ SOLN
INTRAMUSCULAR | Status: AC
  Filled 2016-10-27: qty 2

## 2016-10-27 MED ORDER — BUPIVACAINE-EPINEPHRINE 0.25% -1:200000 IJ SOLN
INTRAMUSCULAR | Status: DC | PRN
  Administered 2016-10-27: 30 mL

## 2016-10-27 MED ORDER — MIDAZOLAM HCL 2 MG/2ML IJ SOLN
INTRAMUSCULAR | Status: AC
  Filled 2016-10-27: qty 2

## 2016-10-27 MED ORDER — LIDOCAINE 2% (20 MG/ML) 5 ML SYRINGE
INTRAMUSCULAR | Status: AC
  Filled 2016-10-27: qty 5

## 2016-10-27 MED ORDER — ONDANSETRON HCL 4 MG/2ML IJ SOLN
INTRAMUSCULAR | Status: DC | PRN
  Administered 2016-10-27: 4 mg via INTRAVENOUS

## 2016-10-27 MED ORDER — HYDROCODONE-ACETAMINOPHEN 5-325 MG PO TABS: 1.0000 | ORAL_TABLET | Freq: Four times a day (QID) | ORAL | 0 refills | Status: DC | PRN

## 2016-10-27 MED ORDER — MIDAZOLAM HCL 2 MG/2ML IJ SOLN
INTRAMUSCULAR | Status: DC | PRN
  Administered 2016-10-27: 2 mg via INTRAVENOUS

## 2016-10-27 MED ORDER — ONDANSETRON HCL 4 MG/2ML IJ SOLN
INTRAMUSCULAR | Status: AC
  Filled 2016-10-27: qty 2

## 2016-10-27 MED ORDER — FENTANYL CITRATE (PF) 100 MCG/2ML IJ SOLN
INTRAMUSCULAR | Status: AC
  Administered 2016-10-27: 50 ug via INTRAVENOUS
  Filled 2016-10-27: qty 2

## 2016-10-27 MED ORDER — SCOPOLAMINE 1 MG/3DAYS TD PT72
MEDICATED_PATCH | TRANSDERMAL | Status: AC
  Filled 2016-10-27: qty 1

## 2016-10-27 MED ORDER — OXYCODONE HCL 5 MG PO TABS
5.0000 mg | ORAL_TABLET | Freq: Once | ORAL | Status: AC | PRN
  Administered 2016-10-27: 5 mg via ORAL

## 2016-10-27 MED ORDER — ROCURONIUM BROMIDE 100 MG/10ML IV SOLN
INTRAVENOUS | Status: DC | PRN
  Administered 2016-10-27: 50 mg via INTRAVENOUS

## 2016-10-27 MED ORDER — PROPOFOL 10 MG/ML IV BOLUS
INTRAVENOUS | Status: AC
  Filled 2016-10-27: qty 20

## 2016-10-27 MED ORDER — ACETAMINOPHEN 10 MG/ML IV SOLN
INTRAVENOUS | Status: DC | PRN
  Administered 2016-10-27: 1000 mg via INTRAVENOUS

## 2016-10-27 MED ORDER — SUGAMMADEX SODIUM 200 MG/2ML IV SOLN
INTRAVENOUS | Status: DC | PRN
  Administered 2016-10-27: 170 mg via INTRAVENOUS

## 2016-10-27 MED ORDER — SUGAMMADEX SODIUM 200 MG/2ML IV SOLN
INTRAVENOUS | Status: AC
  Filled 2016-10-27: qty 2

## 2016-10-27 MED ORDER — MEPERIDINE HCL 25 MG/ML IJ SOLN: 6.2500 mg | INTRAMUSCULAR | Status: DC | PRN

## 2016-10-27 SURGICAL SUPPLY — 55 items
APPLICATOR COTTON TIP 6IN STRL (MISCELLANEOUS) ×3 IMPLANT
APPLIER CLIP 5 13 M/L LIGAMAX5 (MISCELLANEOUS) ×3
BLADE SURG 15 STRL LF DISP TIS (BLADE) ×1 IMPLANT
BLADE SURG 15 STRL SS (BLADE) ×2
CANISTER SUCT 1200ML W/VALVE (MISCELLANEOUS) ×3 IMPLANT
CHLORAPREP W/TINT 26ML (MISCELLANEOUS) ×3 IMPLANT
CHOLANGIOGRAM CATH TAUT (CATHETERS) IMPLANT
CLEANER CAUTERY TIP 5X5 PAD (MISCELLANEOUS) IMPLANT
CLIP APPLIE 5 13 M/L LIGAMAX5 (MISCELLANEOUS) ×1 IMPLANT
DECANTER SPIKE VIAL GLASS SM (MISCELLANEOUS) ×3 IMPLANT
DERMABOND ADVANCED (GAUZE/BANDAGES/DRESSINGS) ×2
DERMABOND ADVANCED .7 DNX12 (GAUZE/BANDAGES/DRESSINGS) ×1 IMPLANT
DEVICE TROCAR PUNCTURE CLOSURE (ENDOMECHANICALS) IMPLANT
DRAPE C-ARM XRAY 36X54 (DRAPES) IMPLANT
ELECT CAUTERY BLADE 6.4 (BLADE) ×3 IMPLANT
ELECT REM PT RETURN 9FT ADLT (ELECTROSURGICAL) ×3
ELECTRODE REM PT RTRN 9FT ADLT (ELECTROSURGICAL) ×1 IMPLANT
ENDOPOUCH RETRIEVER 10 (MISCELLANEOUS) ×3 IMPLANT
GLOVE BIO SURGEON STRL SZ7 (GLOVE) ×3 IMPLANT
GLOVE BIOGEL M STRL SZ7.5 (GLOVE) ×3 IMPLANT
GLOVE BIOGEL PI IND STRL 7.0 (GLOVE) ×2 IMPLANT
GLOVE BIOGEL PI IND STRL 7.5 (GLOVE) ×1 IMPLANT
GLOVE BIOGEL PI INDICATOR 7.0 (GLOVE) ×4
GLOVE BIOGEL PI INDICATOR 7.5 (GLOVE) ×2
GLOVE SURG SYN 6.5 ES PF (GLOVE) ×3 IMPLANT
GOWN STRL REUS W/ TWL LRG LVL3 (GOWN DISPOSABLE) ×3 IMPLANT
GOWN STRL REUS W/TWL LRG LVL3 (GOWN DISPOSABLE) ×6
IRRIGATION STRYKERFLOW (MISCELLANEOUS) IMPLANT
IRRIGATOR STRYKERFLOW (MISCELLANEOUS)
IV CATH ANGIO 12GX3 LT BLUE (NEEDLE) IMPLANT
IV NS 1000ML (IV SOLUTION) ×2
IV NS 1000ML BAXH (IV SOLUTION) ×1 IMPLANT
L-HOOK LAP DISP 36CM (ELECTROSURGICAL) ×3
LHOOK LAP DISP 36CM (ELECTROSURGICAL) ×1 IMPLANT
LIQUID BAND (GAUZE/BANDAGES/DRESSINGS) IMPLANT
NEEDLE HYPO 22GX1.5 SAFETY (NEEDLE) ×3 IMPLANT
PACK LAP CHOLECYSTECTOMY (MISCELLANEOUS) ×3 IMPLANT
PAD CLEANER CAUTERY TIP 5X5 (MISCELLANEOUS)
PENCIL ELECTRO HAND CTR (MISCELLANEOUS) ×3 IMPLANT
SCISSORS METZENBAUM CVD 33 (INSTRUMENTS) ×3 IMPLANT
SET SUCTION IRRIG HYDROSURG (IRRIGATION / IRRIGATOR) ×3 IMPLANT
SLEEVE ENDOPATH XCEL 5M (ENDOMECHANICALS) ×6 IMPLANT
SOL ANTI-FOG 6CC FOG-OUT (MISCELLANEOUS) ×1 IMPLANT
SOL FOG-OUT ANTI-FOG 6CC (MISCELLANEOUS) ×2
SPONGE LAP 18X18 5 PK (GAUZE/BANDAGES/DRESSINGS) ×3 IMPLANT
STOPCOCK 3 WAY  REPLAC (MISCELLANEOUS) IMPLANT
SUT ETHIBOND 0 MO6 C/R (SUTURE) IMPLANT
SUT MNCRL AB 4-0 PS2 18 (SUTURE) ×3 IMPLANT
SUT VIC AB 0 CT2 27 (SUTURE) ×3 IMPLANT
SUT VICRYL 0 AB UR-6 (SUTURE) ×3 IMPLANT
SYR 20CC LL (SYRINGE) ×3 IMPLANT
TROCAR XCEL BLUNT TIP 100MML (ENDOMECHANICALS) ×3 IMPLANT
TROCAR XCEL NON-BLD 5MMX100MML (ENDOMECHANICALS) ×3 IMPLANT
TUBING INSUFFLATOR HI FLOW (MISCELLANEOUS) ×3 IMPLANT
WATER STERILE IRR 1000ML POUR (IV SOLUTION) IMPLANT

## 2016-10-27 NOTE — H&P (Signed)
Patient ID: [name], female   DOB: May 21, 1981, 36 y.o.   MRN: [id number]  History of Present Illness [name] is a 36 y.o. female with Symptomatic cholelithiasis She still  reports having some bloating, some pressure on the right upper quadrant and nausea. She reports that these symptoms are exacerbated after she eats but there is no specific relationship with heavy or fatty meals. No evidence of biliary obstruction, no evidence of abdominal pain, no fevers no chills. Part of her workup has included a CMP that is unremarkable and ultrasound that I have personally reviewed showing evidence of cholelithiasis without cholecystitis and a normal common bile duct.HX Right VATS w lung bx, and C section. No evidence of biliary obstruction or cholangitis. She has good cv performance and is able to do more than 4 METS w/o SOB or C/p. Her resp sxs have improved.  HPI  Past Medical History Past Medical History:  Diagnosis Date  . Anemia    Iron Deficiency  . Asthma    in childhood  . Complication of anesthesia    HARD TO WAKE UP SOMETIIMES  . Endometriosis    dx'd in high school  . Eosinophilic pneumonia (HCC) ~08/2007   per Dr. Beverely Risen s/p VATS and 1 year of prednisone  . GERD (gastroesophageal reflux disease)    TUMS PRN  . Headache    MIGRAINES  . History of abnormal Pap smear   . History of chicken pox   . Hypothyroidism    dx'd 2011  . Pneumonia 2009   EOSINOPHILIC PNEUMONIA  . PONV (postoperative nausea and vomiting)   . Seizure, epileptic (HCC)    in childhood, prev on tegretol, off meds for years w/o symptoms  . Seizures (HCC)    childhood-previously on Tegretol-been off meds for years with no seizures  . Urinary tract bacterial infections       Past Surgical History:  Procedure Laterality Date  . CESAREAN SECTION  2007  . DIAGNOSTIC LAPAROSCOPY     X2  . LUNG BIOPSY  2008  . TONSILLECTOMY AND ADENOIDECTOMY      No Known Allergies  Current  Facility-Administered Medications  Medication Dose Route Frequency Provider Last Rate Last Dose  . ceFAZolin (ANCEF) IVPB 2g/100 mL premix  2 g Intravenous On Call to OR Leafy Ro, MD      . Chlorhexidine Gluconate Cloth 2 % PADS 6 each  6 each Topical Once Leafy Ro, MD       And  . Chlorhexidine Gluconate Cloth 2 % PADS 6 each  6 each Topical Once [place] F Haliyah Fryman, MD      . famotidine (PEPCID) tablet 20 mg  20 mg Oral Once Amy Penwarden, MD      . lactated ringers infusion   Intravenous Continuous Amy Penwarden, MD        Family History Family History  Problem Relation Age of Onset  . Mental illness Mother     bipolar  . Cancer Father     h/o throat cancer- treated  . Heart disease Maternal Grandfather     at ate >60  . Alcohol abuse Maternal Grandfather       Social History Social History  Substance Use Topics  . Smoking status: Former Smoker    Packs/day: 0.50    Years: 10.00    Types: Cigarettes    Quit date: 10/21/2004  . Smokeless tobacco: Never Used  . Alcohol use Yes  Comment: rare       ROS Negative   Physical Exam Blood pressure 111/72, pulse 83, temperature 97.6 F (36.4 C), temperature source Oral, resp. rate 16, height 5\' 2"  (1.575 m), weight 85.3 kg (188 lb), last menstrual period 10/13/2016, SpO2 100 %.  CONSTITUTIONAL: NAD EYES: Pupils equal, round, and reactive to light, Sclera non-icteric. EARS, NOSE, MOUTH AND THROAT: The oropharynx is clear. Oral mucosa is pink and moist. Hearing is intact to voice.  NECK: Trachea is midline, and there is no jugular venous distension. Thyroid is without palpable abnormalities. LYMPH NODES:  Lymph nodes in the neck are not enlarged. RESPIRATORY:  Lungs are clear, and breath sounds are equal bilaterally. Normal respiratory effort without pathologic use of accessory muscles. CARDIOVASCULAR: Heart is regular without murmurs, gallops, or rubs. GI: The abdomen is soft, nontender, and nondistended. There were  no palpable masses. There was no hepatosplenomegaly. There were normal bowel sounds. GU:  MUSCULOSKELETAL:  Normal muscle strength and tone in all four extremities.    SKIN: Skin turgor is normal. There are no pathologic skin lesions.  NEUROLOGIC:  Motor and sensation is grossly normal.  Cranial nerves are grossly intact. PSYCH:  Alert and oriented to person, place and time. Affect is normal.  Data Reviewed   I have personally reviewed the patient's imaging and medical records.    Assessment/PlanSympt Cholelithiasis in need for chole. D/W the pt in detail about the procedure, risks, benefits and possible complications. She understands.  Jolisa Intriago, MD FACS  Ronalda Walpole F Mayla Biddy 10/27/2016, 9:15 AM

## 2016-10-27 NOTE — Transfer of Care (Signed)
Immediate Anesthesia Transfer of Care Note  Patient: Bridget BiblesJennifer C [name]  Procedure(s) Performed: Procedure(s): LAPAROSCOPIC CHOLECYSTECTOMY (N/A)  Patient Location: PACU  Anesthesia Type:General  Level of Consciousness: sedated  Airway & Oxygen Therapy: Patient Spontanous Breathing and Patient connected to face mask oxygen  Post-op Assessment: Report given to RN and Post -op Vital signs reviewed and stable  Post vital signs: Reviewed and stable  Last Vitals:  Vitals:   10/27/16 0901 10/27/16 1054  BP: 111/72 (P) 107/65  Pulse: 83 70  Resp: 16 16  Temp: 36.4 C 36.3 C    Last Pain:  Vitals:   10/27/16 0901  TempSrc: Oral         Complications: No apparent anesthesia complications

## 2016-10-27 NOTE — Anesthesia Procedure Notes (Signed)
Procedure Name: Intubation Performed by: Luanne Krzyzanowski Pre-anesthesia Checklist: Patient identified, Patient being monitored, Timeout performed, Emergency Drugs available and Suction available Patient Re-evaluated:Patient Re-evaluated prior to inductionOxygen Delivery Method: Circle system utilized Preoxygenation: Pre-oxygenation with 100% oxygen Intubation Type: IV induction Ventilation: Mask ventilation without difficulty Laryngoscope Size: Mac and 3 Grade View: Grade I Tube type: Oral Tube size: 7.0 mm Number of attempts: 1 Airway Equipment and Method: Stylet Placement Confirmation: ETT inserted through vocal cords under direct vision,  positive ETCO2 and breath sounds checked- equal and bilateral Secured at: 22 cm Tube secured with: Tape Dental Injury: Teeth and Oropharynx as per pre-operative assessment        

## 2016-10-27 NOTE — Anesthesia Postprocedure Evaluation (Signed)
Anesthesia Post Note  Patient: Bridget BiblesJennifer C [name]  Procedure(s) Performed: Procedure(s) (LRB): LAPAROSCOPIC CHOLECYSTECTOMY (N/A)  Patient location during evaluation: PACU Anesthesia Type: General Level of consciousness: awake and alert and oriented Pain management: pain level controlled Vital Signs Assessment: post-procedure vital signs reviewed and stable Respiratory status: spontaneous breathing, nonlabored ventilation and respiratory function stable Cardiovascular status: blood pressure returned to baseline and stable Postop Assessment: no signs of nausea or vomiting Anesthetic complications: no     Last Vitals:  Vitals:   10/27/16 1139 10/27/16 1149  BP: 98/61 (!) 107/58  Pulse: 78 64  Resp: 13 14  Temp: 36.7 C 36.6 C    Last Pain:  Vitals:   10/27/16 1149  TempSrc: Oral  PainSc: 4                  Adlee Paar

## 2016-10-27 NOTE — Discharge Instructions (Signed)

## 2016-10-27 NOTE — Anesthesia Preprocedure Evaluation (Signed)
Anesthesia Evaluation  Patient identified by MRN, date of birth, ID band Patient awake    Reviewed: Allergy & Precautions, NPO status , Patient's Chart, lab work & pertinent test results  History of Anesthesia Complications (+) PONV and history of anesthetic complications  Airway Mallampati: II  TM Distance: >3 FB Neck ROM: Full    Dental  (+) Implants   Pulmonary asthma , neg sleep apnea, former smoker,  Hx of VATS and resection for eosinophilic pneumonia   breath sounds clear to auscultation- rhonchi (-) wheezing      Cardiovascular Exercise Tolerance: Good (-) hypertension(-) CAD and (-) Past MI  Rhythm:Regular Rate:Normal - Systolic murmurs and - Diastolic murmurs    Neuro/Psych  Headaches, Seizures - (not currently on medications),  negative psych ROS   GI/Hepatic Neg liver ROS, GERD  ,  Endo/Other  neg diabetesHypothyroidism   Renal/GU negative Renal ROS     Musculoskeletal negative musculoskeletal ROS (+)   Abdominal (+) + obese,   Peds  Hematology  (+) anemia ,   Anesthesia Other Findings Past Medical History: No date: Anemia     Comment: Iron Deficiency No date: Asthma     Comment: in childhood No date: Complication of anesthesia     Comment: HARD TO WAKE UP SOMETIIMES No date: Endometriosis     Comment: dx'd in high school ~08/2007: Eosinophilic pneumonia (HCC)     Comment: per Dr. Beverely RisenFozia Khan s/p VATS and 1 year of               prednisone No date: GERD (gastroesophageal reflux disease)     Comment: TUMS PRN No date: Headache     Comment: MIGRAINES No date: History of abnormal Pap smear No date: History of chicken pox No date: Hypothyroidism     Comment: dx'd 2011 2009: Pneumonia     Comment: EOSINOPHILIC PNEUMONIA No date: PONV (postoperative nausea and vomiting) No date: Seizure, epileptic (HCC)     Comment: in childhood, prev on tegretol, off meds for               years w/o  symptoms No date: Seizures (HCC)     Comment: childhood-previously on Tegretol-been off meds              for years with no seizures No date: Urinary tract bacterial infections   Reproductive/Obstetrics                             Anesthesia Physical Anesthesia Plan  ASA: II  Anesthesia Plan: General   Post-op Pain Management:    Induction: Intravenous  Airway Management Planned: Oral ETT  Additional Equipment:   Intra-op Plan:   Post-operative Plan: Extubation in OR  Informed Consent: I have reviewed the patients History and Physical, chart, labs and discussed the procedure including the risks, benefits and alternatives for the proposed anesthesia with the patient or authorized representative who has indicated his/her understanding and acceptance.   Dental advisory given  Plan Discussed with: CRNA and Anesthesiologist  Anesthesia Plan Comments:         Anesthesia Quick Evaluation

## 2016-10-27 NOTE — Op Note (Signed)
Laparoscopic Cholecystectomy  Pre-operative Diagnosis: Symptomatic GS  Post-operative Diagnosis: Same  Procedure: laparoscopic cholecystectomy  Surgeon: Sterling Bigiego Pabon, MD FACS  Anesthesia: Gen. with endotracheal tube   Findings: GS  Estimated Blood Loss: 5cc         Drains: none         Specimens: Gallbladder           Complications: none   Procedure Details  The patient was seen again in the Holding Room. The benefits, complications, treatment options, and expected outcomes were discussed with the patient. The risks of bleeding, infection, recurrence of symptoms, failure to resolve symptoms, bile duct damage, bile duct leak, retained common bile duct stone, bowel injury, any of which could require further surgery and/or ERCP, stent, or papillotomy were reviewed with the patient. The likelihood of improving the patient's symptoms with return to their baseline status is good.  The patient and/or family concurred with the proposed plan, giving informed consent.  The patient was taken to Operating Room, identified as [name] and the procedure verified as Laparoscopic Cholecystectomy.  A Time Out was held and the above information confirmed.  Prior to the induction of general anesthesia, antibiotic prophylaxis was administered. VTE prophylaxis was in place. General endotracheal anesthesia was then administered and tolerated well. After the induction, the abdomen was prepped with Chloraprep and draped in the sterile fashion. The patient was positioned in the supine position.  Local anesthetic  was injected into the skin near the umbilicus and an incision made. Cut down technique was used to enter the abdominal cavity and a Hasson trochar was placed after two vicryl stitches were anchored to the fascia. Pneumoperitoneum was then created with CO2 and tolerated well without any adverse changes in the patient's vital signs.  Three 5-mm ports were placed in the right upper quadrant all  under direct vision. All skin incisions  were infiltrated with a local anesthetic agent before making the incision and placing the trocars.   The patient was positioned  in reverse Trendelenburg, tilted slightly to the patient's left.  The gallbladder was identified, the fundus grasped and retracted cephalad. Adhesions were lysed bluntly. The infundibulum was grasped and retracted laterally, exposing the peritoneum overlying the triangle of Calot. This was then divided and exposed in a blunt fashion. An extended critical view of the cystic duct and cystic artery was obtained.  The cystic duct was clearly identified and bluntly dissected.   Artery and duct were double clipped and divided.  The gallbladder was taken from the gallbladder fossa in a retrograde fashion with the electrocautery. The gallbladder was removed and placed in an Endocatch bag. The liver bed was irrigated and inspected. Hemostasis was achieved with the electrocautery.Irrigation was utilized and was repeatedly aspirated until clear.  The gallbladder and Endocatch sac were then removed through the periumbilical port site.   Inspection of the right upper quadrant was performed. No bleeding, bile duct injury or leak, or bowel injury was noted. Pneumoperitoneum was released.  The periumbilical port site was closed with figure-of-eight 0 Vicryl sutures. 4-0 subcuticular Monocryl was used to close the skin. Dermabond was  applied.  The patient was then extubated and brought to the recovery room in stable condition. Sponge, lap, and needle counts were correct at closure and at the conclusion of the case.               Sterling Bigiego Pabon, MD, FACS

## 2016-10-28 LAB — SURGICAL PATHOLOGY

## 2016-10-29 ENCOUNTER — Encounter: Payer: Self-pay | Admitting: Surgery

## 2016-11-05 ENCOUNTER — Encounter: Payer: 59 | Admitting: Surgery

## 2016-11-06 ENCOUNTER — Other Ambulatory Visit: Payer: 59

## 2016-11-07 ENCOUNTER — Encounter: Payer: Self-pay | Admitting: Surgery

## 2016-11-07 ENCOUNTER — Ambulatory Visit (INDEPENDENT_AMBULATORY_CARE_PROVIDER_SITE_OTHER): Payer: 59 | Admitting: Surgery

## 2016-11-07 VITALS — BP 108/75 | HR 76 | Temp 97.6°F | Ht 62.0 in | Wt 185.6 lb

## 2016-11-07 DIAGNOSIS — Z09 Encounter for follow-up examination after completed treatment for conditions other than malignant neoplasm: Secondary | ICD-10-CM

## 2016-11-07 NOTE — Progress Notes (Signed)
S/p lap chole last week.  Path d/w pt No complaints, taking po VSS  PE NAD Abd: incisions c/d/i, no infection No peritonitis  A/P Doing well No heavy lifting RTC prn

## 2016-11-07 NOTE — Patient Instructions (Signed)
Please call with any questions or concerns.

## 2016-12-01 ENCOUNTER — Encounter: Payer: Self-pay | Admitting: Family Medicine

## 2016-12-01 ENCOUNTER — Ambulatory Visit (INDEPENDENT_AMBULATORY_CARE_PROVIDER_SITE_OTHER): Payer: 59 | Admitting: Family Medicine

## 2016-12-01 ENCOUNTER — Ambulatory Visit (INDEPENDENT_AMBULATORY_CARE_PROVIDER_SITE_OTHER)
Admission: RE | Admit: 2016-12-01 | Discharge: 2016-12-01 | Disposition: A | Discharge status: Home / Self Care | Payer: 59 | Source: Ambulatory Visit | Attending: Family Medicine | Admitting: Family Medicine

## 2016-12-01 VITALS — BP 112/78 | HR 69 | Temp 98.5°F | Wt 184.0 lb

## 2016-12-01 DIAGNOSIS — R109 Unspecified abdominal pain: Secondary | ICD-10-CM | POA: Diagnosis not present

## 2016-12-01 DIAGNOSIS — E039 Hypothyroidism, unspecified: Secondary | ICD-10-CM | POA: Diagnosis not present

## 2016-12-01 MED ORDER — LEVOTHYROXINE SODIUM 125 MCG PO TABS: ORAL_TABLET | ORAL | Status: DC

## 2016-12-01 NOTE — Patient Instructions (Signed)
Go to the lab on the way out.  We'll contact you with your lab and xray report. I'll check with surgery in the meantime.  Take care.  Glad to see you.

## 2016-12-01 NOTE — Progress Notes (Signed)
S/p cholecystectomy about 5 weeks ago.   No complications with surgery or recovery.   She can't tell if she ever got fully better.  When she thought she was feeling better from her surgery, then she started having R abd pain from the umbilicus laterally to the R side around to the back (or was once again aware of the pain that may have prev been masked).  No vomiting.  No diarrhea.  No L sided pain.  No fevers.  No dysuria.    R sided abd pain is constant.  Sometimes worse than others.  No clear triggers noted.  Can be worse or better with eating or not, no specific trigger.  When it gets bad, it lasts about 10-15 minutes, then slowly improves.  She can have hours without a bad flare, but will still have residual pain.  3-4 bad flares a day on average.  No blood in stool.    She hasn't talked to surgery about this yet.  She did have this kind of pain prior to the surgery.  Her bloating and gas sx are better after the cholecystectomy.  The current pain wraps around to the back/spine but doesn't cross the midline, in an apparent dermatomal distribution but without rash.  LMP 11/04/16, ie not late.   PMH and SH reviewed  ROS: Per HPI unless specifically indicated in ROS section   Meds, vitals, and allergies reviewed.   GEN: nad, alert and oriented HEENT: mucous membranes moist NECK: supple w/o LA CV: rrr.  no murmur PULM: ctab, no inc wob ABD: soft, +bs, R abd ttp lateral to umbilicus but RUQ and RLQ not ttp EXT: no edema SKIN: no acute rash Back nontender. No CVA pain.

## 2016-12-01 NOTE — Progress Notes (Signed)
Pre visit review using our clinic review tool, if applicable. No additional management support is needed unless otherwise documented below in the visit note. 

## 2016-12-02 LAB — CBC WITH DIFFERENTIAL/PLATELET
BASOS ABS: 0 10*3/uL (ref 0.0–0.1)
Basophils Relative: 0.5 % (ref 0.0–3.0)
Eosinophils Absolute: 0.3 10*3/uL (ref 0.0–0.7)
Eosinophils Relative: 4 % (ref 0.0–5.0)
HCT: 38.8 % (ref 36.0–46.0)
Hemoglobin: 12.7 g/dL (ref 12.0–15.0)
LYMPHS ABS: 1.7 10*3/uL (ref 0.7–4.0)
Lymphocytes Relative: 23.5 % (ref 12.0–46.0)
MCHC: 32.7 g/dL (ref 30.0–36.0)
MCV: 81.6 fl (ref 78.0–100.0)
MONO ABS: 0.5 10*3/uL (ref 0.1–1.0)
MONOS PCT: 7.5 % (ref 3.0–12.0)
NEUTROS PCT: 64.5 % (ref 43.0–77.0)
Neutro Abs: 4.6 10*3/uL (ref 1.4–7.7)
Platelets: 261 10*3/uL (ref 150.0–400.0)
RBC: 4.75 Mil/uL (ref 3.87–5.11)
RDW: 19.4 % — ABNORMAL HIGH (ref 11.5–15.5)
WBC: 7.2 10*3/uL (ref 4.0–10.5)

## 2016-12-02 LAB — COMPREHENSIVE METABOLIC PANEL
ALT: 8 U/L (ref 0–35)
AST: 16 U/L (ref 0–37)
Albumin: 4.5 g/dL (ref 3.5–5.2)
Alkaline Phosphatase: 56 U/L (ref 39–117)
BUN: 6 mg/dL (ref 6–23)
CO2: 28 mEq/L (ref 19–32)
Calcium: 9.6 mg/dL (ref 8.4–10.5)
Chloride: 105 mEq/L (ref 96–112)
Creatinine, Ser: 0.64 mg/dL (ref 0.40–1.20)
GFR: 113.12 mL/min (ref >60.00)
Glucose, Bld: 83 mg/dL (ref 70–99)
Potassium: 4.2 mEq/L (ref 3.5–5.1)
Sodium: 139 mEq/L (ref 135–145)
Total Bilirubin: 0.3 mg/dL (ref 0.2–1.2)
Total Protein: 7.5 g/dL (ref 6.0–8.3)

## 2016-12-03 ENCOUNTER — Telehealth: Payer: Self-pay | Admitting: Family Medicine

## 2016-12-03 ENCOUNTER — Encounter: Payer: Self-pay | Admitting: Family Medicine

## 2016-12-03 DIAGNOSIS — R109 Unspecified abdominal pain: Secondary | ICD-10-CM | POA: Insufficient documentation

## 2016-12-03 NOTE — Telephone Encounter (Signed)
Please call patient. I want her to see the surgeon again;have her call for an appointment. Please see message below from the surgeon. She may have a nerve that has gotten irritated and it may be causing her symptoms. I cut and pasted from a staff message. Thanks. GSD ================================ Thank you for writing [name]. It is rare to have Gallstones after A lap chole. I will be happy to see her . Sometimes these folks may develop some intercostal neuralgia from retraction that usually respond to Gabapentin. Again I will be more than happy to see her and talk to her.  Thanks  [employer]

## 2016-12-03 NOTE — Assessment & Plan Note (Signed)
Her bloating and gas symptoms are clearly better after the cholecystectomy. she has no complications from the previous surgery. She describes a dermatomal distribution for the pain on the right side. It clearly waxes and wanes, but this never fully resolves. No rash. I will check with surgery about this to see if they have any input. Recheck routine labs today. Reasonable to check back films just to make sure there is no obvious bony deformity that could be contributing to radicular pain. Discussed with patient. She agrees. At this point still okay for outpatient follow-up.

## 2016-12-04 NOTE — Telephone Encounter (Signed)
I see her point, but I would still get surgery input on this and then go from there.  Thanks.

## 2016-12-04 NOTE — Telephone Encounter (Signed)
Pt notified of Dr. Lianne Bushyuncan's instructions and verbalized understanding. Pt wanted me to let you know that she will call the surgeon but she said this issues has been going on before her surgery so she doesn't think it's related

## 2016-12-05 NOTE — Telephone Encounter (Signed)
Pt is aware and she states she has an appt next week

## 2016-12-11 ENCOUNTER — Telehealth: Payer: Self-pay

## 2016-12-11 ENCOUNTER — Ambulatory Visit (INDEPENDENT_AMBULATORY_CARE_PROVIDER_SITE_OTHER): Payer: 59 | Admitting: Surgery

## 2016-12-11 ENCOUNTER — Encounter: Payer: Self-pay | Admitting: Surgery

## 2016-12-11 VITALS — BP 116/69 | HR 71 | Temp 98.4°F | Ht 62.0 in | Wt 184.4 lb

## 2016-12-11 DIAGNOSIS — G8929 Other chronic pain: Secondary | ICD-10-CM

## 2016-12-11 DIAGNOSIS — R1011 Right upper quadrant pain: Secondary | ICD-10-CM

## 2016-12-11 DIAGNOSIS — Z09 Encounter for follow-up examination after completed treatment for conditions other than malignant neoplasm: Secondary | ICD-10-CM

## 2016-12-11 NOTE — Progress Notes (Signed)
S/p lap chole 6 weeks ago did well but now has RUQ fullness and pain. Bloating has gone away and some GI sxs have subsided after lap chole. NML LFts NML WBCno fevers, no biliary obstruction  PE NAD Chest: right chest wall tenderness Abd: soft, NT, incisions healed, no peritonitis  A/p Chest wall pain likely IC neuritis d/w her about NSAIDS and conservative rx No surgical intervention  We will order RUQ U/S to r/o complications related to Chole F/U 2 weeks No need for immediate surgical intervnetion

## 2016-12-11 NOTE — Telephone Encounter (Signed)
Patient notified of Ultrasound appointment listed below.   [facility] Patient verbalized understanding.

## 2016-12-11 NOTE — Patient Instructions (Addendum)
We will schedule the Ultrasound and call you tomorrow with the appointment information.  Please see your follow up appointment with Dr.Pabon listed below.

## 2016-12-19 ENCOUNTER — Telehealth: Payer: Self-pay

## 2016-12-19 ENCOUNTER — Ambulatory Visit
Admission: RE | Admit: 2016-12-19 | Discharge: 2016-12-19 | Disposition: A | Discharge status: Home / Self Care | Payer: 59 | Source: Ambulatory Visit | Attending: Surgery | Admitting: Surgery

## 2016-12-19 DIAGNOSIS — R1011 Right upper quadrant pain: Secondary | ICD-10-CM | POA: Diagnosis not present

## 2016-12-19 DIAGNOSIS — G8929 Other chronic pain: Secondary | ICD-10-CM | POA: Diagnosis not present

## 2016-12-19 DIAGNOSIS — Z9049 Acquired absence of other specified parts of digestive tract: Secondary | ICD-10-CM | POA: Insufficient documentation

## 2016-12-19 DIAGNOSIS — R109 Unspecified abdominal pain: Secondary | ICD-10-CM

## 2016-12-19 NOTE — Telephone Encounter (Signed)
Patient notified of Ultrasound results and reminded of follow up appointment on 12/24/16 with Dr.Pabon. Patient verbalized understanding.

## 2016-12-19 NOTE — Telephone Encounter (Signed)
Pt left v/m; pt did as instructed to see general surgeon for stomach pain. Pt had US that was reported to pt was no issues.   Pt saw Dr Para Marchuncan 12/01/16. Pt request GI referral to find out cause of abd pain. Pt request cb.

## 2016-12-21 NOTE — Telephone Encounter (Signed)
Ordered. Thanks

## 2016-12-22 ENCOUNTER — Telehealth: Payer: Self-pay

## 2016-12-22 NOTE — Telephone Encounter (Signed)
Normal result of US received from Dr. Everlene FarrierPabon. He would like patient to be aware of this and follow-up as scheduled.  Call made to patient. No answer. Left voicemail for return phone call.

## 2016-12-22 NOTE — Telephone Encounter (Signed)
Detailed message left on patient's voicemail that the referral has been place by Dr. Para Marchuncan and she will hear back from one of the referral coordinators to get this set up. (DPR)

## 2016-12-22 NOTE — Telephone Encounter (Signed)
Patient returned your phone call. I read her the results posted below. Patient understood.

## 2016-12-23 ENCOUNTER — Encounter: Payer: Self-pay | Admitting: Gastroenterology

## 2016-12-24 ENCOUNTER — Encounter: Payer: 59 | Admitting: Surgery

## 2017-01-14 LAB — HM PAP SMEAR: HM PAP: NEGATIVE

## 2017-01-20 ENCOUNTER — Other Ambulatory Visit: Payer: Self-pay | Admitting: *Deleted

## 2017-01-20 DIAGNOSIS — E039 Hypothyroidism, unspecified: Secondary | ICD-10-CM

## 2017-01-20 MED ORDER — LEVOTHYROXINE SODIUM 125 MCG PO TABS: ORAL_TABLET | ORAL | 0 refills | Status: DC

## 2017-01-20 NOTE — Telephone Encounter (Signed)
Received refill request from pharmacy for Levothyroxine. Refill sent in electronically

## 2017-01-26 ENCOUNTER — Ambulatory Visit: Payer: 59 | Admitting: Gastroenterology

## 2017-02-27 ENCOUNTER — Encounter: Payer: Self-pay | Admitting: Gastroenterology

## 2017-02-27 ENCOUNTER — Ambulatory Visit (INDEPENDENT_AMBULATORY_CARE_PROVIDER_SITE_OTHER): Payer: 59 | Admitting: Gastroenterology

## 2017-02-27 VITALS — BP 112/78 | Ht 62.0 in | Wt 185.0 lb

## 2017-02-27 DIAGNOSIS — K5909 Other constipation: Secondary | ICD-10-CM

## 2017-02-27 DIAGNOSIS — R1011 Right upper quadrant pain: Secondary | ICD-10-CM

## 2017-02-27 NOTE — Patient Instructions (Signed)
Constipation:    Increase your intake of water, fruits/vegetables and your activity level.  Ducosate stool softener, 100 mg twice daily. If not improved in 1 week after starting that, please add:  Miralax powder  1 capful in a glass of water or juice once daily.  Please call us in about 2 weeks with an update on your symptoms.

## 2017-02-27 NOTE — Progress Notes (Signed)
[facility] Consult Note:  History: Bridget BiblesJennifer C [name] 02/27/2017  Referring physician: Joaquim Namuncan, Graham S, MD  Reason for consult/chief complaint: Abdominal Pain (Mid abd. Radiates to back. Intense after cholecytectomy (removed Jan 2018) due to gallstones) and Constipation (BM every 5-6 days. Been like this for years and does not consider it a problem)   Subjective  HPI:  This is a 36 year old woman referred by primary care for persistent right-sided abdominal pain. She says it began last October and was of gradual onset. Seems to start above the umbilicus and radiated around to the right flank. Ultrasound revealed multiple gallstones, and this was felt likely to be the cause. She underwent cholecystectomy after the first of the year, but unfortunately continued to have the same pain. Preop and postop LFTs were normal. Postop ultrasound showed no fluid collection or other apparent postsurgical complications. She describes the pain as a fairly constant pressure or ache unrelated to eating, position, time of day or bowel movements. There is intermittent nausea without vomiting. There had been postprandial gas and bloating that seemed to improve after cholecystectomy. Her appetite is good and her weight stable. She typically has a bowel movement every 5 or 6 days with no feelings of the need for a BM during that time.   ROS:  Review of Systems  Constitutional: Negative for appetite change and unexpected weight change.  HENT: Negative for mouth sores and voice change.   Eyes: Negative for pain and redness.  Respiratory: Negative for cough and shortness of breath.   Cardiovascular: Negative for chest pain and palpitations.  Genitourinary: Negative for dysuria and hematuria.  Musculoskeletal: Negative for arthralgias and myalgias.  Skin: Negative for pallor and rash.  Neurological: Negative for weakness and headaches.  Hematological: Negative for adenopathy.      Past Medical History: Past Medical History:  Diagnosis Date  . Anemia    Iron Deficiency  . Asthma    in childhood  . Complication of anesthesia    HARD TO WAKE UP SOMETIIMES  . Endometriosis    dx'd in high school  . Eosinophilic pneumonia (HCC) ~08/2007   per Dr. Beverely RisenFozia Khan s/p VATS and 1 year of prednisone  . GERD (gastroesophageal reflux disease)    TUMS PRN  . Headache    MIGRAINES  . History of abnormal Pap smear   . History of chicken pox   . Hypothyroidism    dx'd 2011  . Pneumonia 2009   EOSINOPHILIC PNEUMONIA  . PONV (postoperative nausea and vomiting)   . Seizure, epileptic (HCC)    in childhood, prev on tegretol, off meds for years w/o symptoms  . Seizures (HCC)    childhood-previously on Tegretol-been off meds for years with no seizures  . Urinary tract bacterial infections      Past Surgical History: Past Surgical History:  Procedure Laterality Date  . CESAREAN SECTION  2007  . CHOLECYSTECTOMY N/A 10/27/2016   Procedure: LAPAROSCOPIC CHOLECYSTECTOMY;  Surgeon: Leafy Roiego F Pabon, MD;  Location: [facility] ORS;  Service: General;  Laterality: N/A;  . DIAGNOSTIC LAPAROSCOPY     X2  . LUNG BIOPSY  2008  . TONSILLECTOMY AND ADENOIDECTOMY      2 prior laparoscopy in high school for endometriosis  Family History: Family History  Problem Relation Age of Onset  . Mental illness Mother        bipolar  . Cancer Father        h/o throat cancer- treated  . Heart disease Maternal  Grandfather        at ate >60  . Alcohol abuse Maternal Grandfather     Social History: Social History   Social History  . Marital status: Married    Spouse name: N/A  . Number of children: 2  . Years of education: N/A   Social History Main Topics  . Smoking status: Former Smoker    Packs/day: 0.50    Years: 10.00    Types: Cigarettes    Quit date: 10/21/2004  . Smokeless tobacco: Never Used  . Alcohol use Yes     Comment: rare  . Drug use: No  . Sexual activity: Yes     Partners: Male    Birth control/ protection: Condom   Other Topics Concern  . None   Social History Narrative   Caffeine use:  Yes   Regular exercise:  Yes   2 pregnancies = 2 live births, 1 son and 1 daughter   From [place], in [place] since 2003          Allergies: No Known Allergies  Outpatient Meds: Current Outpatient Prescriptions  Medication Sig Dispense Refill  . albuterol (PROVENTIL) (5 MG/ML) 0.5% nebulizer solution Take 2.5 mg by nebulization every 6 (six) hours as needed for wheezing or shortness of breath.    . ferrous sulfate 325 (65 FE) MG tablet Take 325 mg by mouth every other day.    . levothyroxine (SYNTHROID, LEVOTHROID) 125 MCG tablet Take 1 tablet by mouth once daily except on Sundays take 1.5 tablets. 102 tablet 0  . Norethin Ace-Eth Estrad-FE (BLISOVI 24 FE PO) Take by mouth.     No current facility-administered medications for this visit.       ___________________________________________________________________ Objective   Exam:  BP 112/78   Ht 5\' 2"  (1.575 m)   Wt 185 lb (83.9 kg)   BMI 33.84 kg/m    General: this is a(n) Well-appearing woman   Eyes: sclera anicteric, no redness  ENT: oral mucosa moist without lesions, no cervical or supraclavicular lymphadenopathy, good dentition  CV: RRR without murmur, S1/S2, no JVD, no peripheral edema  Resp: clear to auscultation bilaterally, normal RR and effort noted  GI: soft, moderate tenderness to deep palpation, with active bowel sounds. No guarding or palpable organomegaly noted.  Skin; warm and dry, no rash or jaundice noted  Neuro: awake, alert and oriented x 3. Normal gross motor function and fluent speech  Radiologic Studies:  Neg T spine films February nml post op [place] (stones prop) nml LFTs before and after   Assessment: Encounter Diagnoses  Name Primary?  . RUQ pain Yes  . Other constipation     This pain is difficult to characterize. It sounds likely to be benign, and clearly  the gallbladder was not the cause. It does not sound typical for ulcer, I do not thing she has a retained CBD stone, it does not sound likely to be IBD with chronic constipation. Although this is been her bowel pattern for many years, perhaps she has evolved a functional bowel disorder from that.  Plan:  I outlined a bowel regimen. If she does not have improvement in pain despite relief of constipation, I think the next test would be a CT scan abdomen and pelvis to rule out any occult inflammatory or neoplastic cause, assuming her insurance will approve this. I'm not planning an endoscopic workup at this point.  Thank you for the courtesy of this consult.  Please call me with any questions  or concerns.  Charlie Pitter III  CC: Joaquim Nam, MD

## 2017-03-24 ENCOUNTER — Other Ambulatory Visit: Payer: Self-pay | Admitting: Family Medicine

## 2017-03-24 DIAGNOSIS — E039 Hypothyroidism, unspecified: Secondary | ICD-10-CM

## 2017-03-25 NOTE — Telephone Encounter (Signed)
Received refill electronically Last TSH 03/18/16 Last office visit 12/01/16 Please advise when patient needs lab work?

## 2017-03-25 NOTE — Telephone Encounter (Signed)
Spoke to patient and follow-up appointments scheduled.

## 2017-03-25 NOTE — Telephone Encounter (Signed)
Left message on voicemail for patient to call back. 

## 2017-03-25 NOTE — Telephone Encounter (Signed)
Pt returned your call.  

## 2017-03-25 NOTE — Telephone Encounter (Signed)
Needs labs and OV later this summer.  Sent.  Thanks.

## 2017-04-27 ENCOUNTER — Other Ambulatory Visit: Payer: Self-pay | Admitting: Family Medicine

## 2017-04-27 DIAGNOSIS — E039 Hypothyroidism, unspecified: Secondary | ICD-10-CM

## 2017-04-27 NOTE — Progress Notes (Signed)
t

## 2017-04-29 ENCOUNTER — Other Ambulatory Visit (INDEPENDENT_AMBULATORY_CARE_PROVIDER_SITE_OTHER): Payer: 59

## 2017-04-29 DIAGNOSIS — D649 Anemia, unspecified: Secondary | ICD-10-CM

## 2017-04-29 DIAGNOSIS — E039 Hypothyroidism, unspecified: Secondary | ICD-10-CM

## 2017-04-29 LAB — CBC WITH DIFFERENTIAL/PLATELET
BASOS ABS: 0 10*3/uL (ref 0.0–0.1)
Basophils Relative: 0.4 % (ref 0.0–3.0)
EOS ABS: 0.2 10*3/uL (ref 0.0–0.7)
Eosinophils Relative: 4.2 % (ref 0.0–5.0)
HEMATOCRIT: 39.3 % (ref 36.0–46.0)
Hemoglobin: 13.4 g/dL (ref 12.0–15.0)
Lymphocytes Relative: 25.4 % (ref 12.0–46.0)
Lymphs Abs: 1.5 10*3/uL (ref 0.7–4.0)
MCHC: 34.2 g/dL (ref 30.0–36.0)
MCV: 87.1 fl (ref 78.0–100.0)
MONOS PCT: 5.4 % (ref 3.0–12.0)
Monocytes Absolute: 0.3 10*3/uL (ref 0.1–1.0)
NEUTROS ABS: 3.7 10*3/uL (ref 1.4–7.7)
Neutrophils Relative %: 64.6 % (ref 43.0–77.0)
Platelets: 225 10*3/uL (ref 150.0–400.0)
RBC: 4.51 Mil/uL (ref 3.87–5.11)
RDW: 13.1 % (ref 11.5–15.5)
WBC: 5.8 10*3/uL (ref 4.0–10.5)

## 2017-04-29 LAB — TSH: TSH: 6.44 u[IU]/mL — ABNORMAL HIGH (ref 0.35–4.50)

## 2017-04-29 LAB — IBC PANEL
IRON: 78 ug/dL (ref 42–145)
SATURATION RATIOS: 16.6 % — AB (ref 20.0–50.0)
TRANSFERRIN: 336 mg/dL (ref 212.0–360.0)

## 2017-05-04 ENCOUNTER — Encounter: Payer: Self-pay | Admitting: Family Medicine

## 2017-05-04 ENCOUNTER — Ambulatory Visit (INDEPENDENT_AMBULATORY_CARE_PROVIDER_SITE_OTHER): Payer: 59 | Admitting: Family Medicine

## 2017-05-04 VITALS — BP 104/70 | HR 66 | Temp 97.9°F | Wt 183.5 lb

## 2017-05-04 DIAGNOSIS — Z7189 Other specified counseling: Secondary | ICD-10-CM | POA: Diagnosis not present

## 2017-05-04 DIAGNOSIS — E039 Hypothyroidism, unspecified: Secondary | ICD-10-CM | POA: Diagnosis not present

## 2017-05-04 DIAGNOSIS — D509 Iron deficiency anemia, unspecified: Secondary | ICD-10-CM

## 2017-05-04 MED ORDER — LEVOTHYROXINE SODIUM 125 MCG PO TABS: ORAL_TABLET | ORAL | 3 refills | Status: DC

## 2017-05-04 NOTE — Assessment & Plan Note (Signed)
Would have her husband designated if patient were incapacitated. 

## 2017-05-04 NOTE — Progress Notes (Signed)
Hypothyroidism.  No neck mass, no dysphagia.  No ADE on med.  Complaint.  Some fatigue.  TSH mildly elevated.    H/o IDA.  No blood in stool.  On iron.  No black stools.  CBC improved.  HGB okay.   Lighter periods on OCPs.  D/w pt.  Reasonable to stop iron and recheck in about 2 months.  .  She has GI f/u on Friday.  Still with R sided abd pain.  I'll defer, d/w pt.  She agrees.    Last pap smear done about 3 month ago, normal per patient report.    Tetanus encouraged, declined.   Meds, vitals, and allergies reviewed.   ROS: Per HPI unless specifically indicated in ROS section   GEN: nad, alert and oriented HEENT: mucous membranes moist NECK: supple w/o LA, no tmg CV: rrr.   PULM: ctab, no inc wob ABD: soft, +bs EXT: no edema

## 2017-05-04 NOTE — Patient Instructions (Signed)
Increase total weekly thyroid dose and stop iron. Recheck labs in about 2 months.  Lab visit.  Take care.  Glad to see you.

## 2017-05-05 NOTE — Assessment & Plan Note (Signed)
Improved, stop iron. Recheck labs in about 2 months. She agrees.

## 2017-05-05 NOTE — Assessment & Plan Note (Signed)
Increased replacement (will take 125 MCG daily except for 250 on Sundays), recheck TSH in about 2 months. She agrees. No thyromegaly on exam.

## 2017-05-06 ENCOUNTER — Ambulatory Visit: Payer: 59 | Admitting: Gastroenterology

## 2017-05-06 ENCOUNTER — Other Ambulatory Visit: Payer: Self-pay

## 2017-07-06 ENCOUNTER — Other Ambulatory Visit: Payer: 59

## 2017-07-30 ENCOUNTER — Ambulatory Visit (INDEPENDENT_AMBULATORY_CARE_PROVIDER_SITE_OTHER): Payer: 59 | Admitting: Gastroenterology

## 2017-07-30 ENCOUNTER — Encounter: Payer: Self-pay | Admitting: Gastroenterology

## 2017-07-30 VITALS — BP 104/76 | HR 72 | Ht 62.0 in | Wt 187.4 lb

## 2017-07-30 DIAGNOSIS — K5900 Constipation, unspecified: Secondary | ICD-10-CM | POA: Diagnosis not present

## 2017-07-30 DIAGNOSIS — R140 Abdominal distension (gaseous): Secondary | ICD-10-CM

## 2017-07-30 DIAGNOSIS — R1033 Periumbilical pain: Secondary | ICD-10-CM

## 2017-07-30 MED ORDER — NA SULFATE-K SULFATE-MG SULF 17.5-3.13-1.6 GM/177ML PO SOLN: 1.0000 | Freq: Once | ORAL | 0 refills | Status: AC

## 2017-07-30 NOTE — Patient Instructions (Signed)
If you are age 34 or older, your body mass index should be between 23-30. Your Body mass index is 34.27 kg/m. If this is out of the aforementioned range listed, please consider follow up with your Primary Care Provider.  If you are age 71 or younger, your body mass index should be between 19-25. Your Body mass index is 34.27 kg/m. If this is out of the aformentioned range listed, please consider follow up with your Primary Care Provider.   You have been scheduled for an endoscopy. Please follow written instructions given to you at your visit today. If you use inhalers (even only as needed), please bring them with you on the day of your procedure. Your physician has requested that you go to www.startemmi.com and enter the access code given to you at your visit today. This web site gives a general overview about your procedure. However, you should still follow specific instructions given to you by our office regarding your preparation for the procedure.  You have been scheduled for a CT scan of the abdomen and pelvis at [facility] ([address]---this is in the same building as Press [facility]).   You are scheduled on 07-31-2017 at 2pm. You should arrive 15 minutes prior to your appointment time for registration. Please follow the written instructions below on the day of your exam:  WARNING: IF YOU ARE ALLERGIC TO IODINE/X-RAY DYE, PLEASE NOTIFY RADIOLOGY IMMEDIATELY AT [phone]! YOU WILL BE GIVEN A 13 HOUR PREMEDICATION PREP.  1) Do not eat anything after 11am (4 hours prior to your test) 2) You have been given 2 bottles of oral contrast to drink. The solution may taste               better if refrigerated, but do NOT add ice or any other liquid to this solution. Shake             well before drinking.    Drink 1 bottle of contrast @ 12/noon (2 hours prior to your exam)  Drink 1 bottle of contrast @ 1pm (1 hour prior to your exam)  You may take any medications as  prescribed with a small amount of water except for the following: Metformin, Glucophage, Glucovance, Avandamet, Riomet, Fortamet, Actoplus Met, Janumet, Glumetza or Metaglip. The above medications must be held the day of the exam AND 48 hours after the exam.  The purpose of you drinking the oral contrast is to aid in the visualization of your intestinal tract. The contrast solution may cause some diarrhea. Before your exam is started, you will be given a small amount of fluid to drink. Depending on your individual set of symptoms, you may also receive an intravenous injection of x-ray contrast/dye. Plan on being at [facility] for 30 minutes or longer, depending on the type of exam you are having performed.  This test typically takes 30-45 minutes to complete.  If you have any questions regarding your exam or if you need to reschedule, you may call the CT department at [phone] between the hours of 8:00 am and 5:00 pm, Monday-Friday.  ________________________________________________________________________    Thank you for choosing [facility]  Dr Wilfrid Lund III

## 2017-07-30 NOTE — Progress Notes (Signed)
[facility] Progress Note  Chief Complaint: Abdominal pain  Subjective  History:  [name] is here for the first time since her initial consult in May of this year. She is feeling exactly the same, with frequent periumbilical abdominal pain that radiates all the way around to the right flank and the mid back. She has associated bloating, sometimes worse with certain foods. She also tends toward constipation, and had no improvement on Dulcolax. She took fiber supplements but that only gave her diarrhea. There's been no rectal bleeding, nausea or vomiting, she denies weight loss. Typically she will have a bowel movement every 2 or 3 days. She has become very frustrated over the long-standing symptoms and lack of clear explanation. She was particularly disappointed that it persisted after cholecystectomy.  ROS: Cardiovascular:  no chest pain Respiratory: no dyspnea Remainder systems negative except as above in history The patient's Past Medical, Family and Social History were reviewed and are on file in the EMR.  Objective:  Med list reviewed  Current Outpatient Prescriptions:  .  albuterol (PROVENTIL) (5 MG/ML) 0.5% nebulizer solution, Take 2.5 mg by nebulization every 6 (six) hours as needed for wheezing or shortness of breath., Disp: , Rfl:  .  fluoruracil (CARAC) 0.5 % cream, Apply topically 2 (two) times daily., Disp: , Rfl:  .  levothyroxine (SYNTHROID, LEVOTHROID) 125 MCG tablet, TAKE 1 TABLET BY MOUTH ONCE DAILY EXCEPT ON SUNDAYS  TAKE 2 TABLETS, 8 pills per week., Disp: 100 tablet, Rfl: 3 .  Norethin Ace-Eth Estrad-FE (BLISOVI 24 FE PO), Take by mouth., Disp: , Rfl:  .  Na Sulfate-K Sulfate-Mg Sulf 17.5-3.13-1.6 GM/180ML SOLN, Take 1 kit by mouth once., Disp: 354 mL, Rfl: 0   Vital signs in last 24 hrs: Vitals:   07/30/17 1557  BP: 104/76  Pulse: 72    Physical Exam    HEENT: sclera anicteric, oral mucosa moist without lesions  Neck: supple, no thyromegaly,  JVD or lymphadenopathy  Cardiac: RRR without murmurs, S1S2 heard, no peripheral edema  Pulm: clear to auscultation bilaterally, normal RR and effort noted  Abdomen: soft, No tenderness, with active bowel sounds. No guarding or palpable hepatosplenomegaly.  Skin; warm and dry, no jaundice or rash   @ASSESSMENTPLANBEGIN @ Assessment: Encounter Diagnoses  Name Primary?  . Abdominal bloating Yes  . Constipation, unspecified constipation type   . Periumbilical abdominal pain    It is still difficult to characterize the symptoms. She is concerned that perhaps it is not related to the digestive tract. I think that is unlikely given the location and character of the pain. Ultimately, it sounds most consistent with a functional bowel disorder such as IBS-C.  Nevertheless, further workup is warranted to give a clearer diagnosis, and she would very much like to pursue that.  She understands that I may do testing and not find any clear answer, and then I might use some IBS therapy such as Linzess.  Plan: EGD and colonoscopy, she is agreeable after a thorough discussion of procedure and risks.  The benefits and risks of the planned procedure were described in detail with the patient or (when appropriate) their health care proxy.  Risks were outlined as including, but not limited to, bleeding, infection, perforation, adverse medication reaction leading to cardiac or pulmonary decompensation, or pancreatitis (if ERCP).  The limitation of incomplete mucosal visualization was also discussed.  No guarantees or warranties were given.  CT scan abdomen and pelvis with contrast  Total time 30 minutes, over  half spent counseling and coordinating care.  Nelida Meuse III

## 2017-07-31 ENCOUNTER — Inpatient Hospital Stay: Admission: RE | Admit: 2017-07-31 | Payer: 59 | Source: Ambulatory Visit

## 2017-08-05 ENCOUNTER — Ambulatory Visit (INDEPENDENT_AMBULATORY_CARE_PROVIDER_SITE_OTHER)
Admission: RE | Admit: 2017-08-05 | Discharge: 2017-08-05 | Disposition: A | Discharge status: Home / Self Care | Payer: 59 | Source: Ambulatory Visit | Attending: Gastroenterology | Admitting: Gastroenterology

## 2017-08-05 DIAGNOSIS — K5900 Constipation, unspecified: Secondary | ICD-10-CM | POA: Diagnosis not present

## 2017-08-05 MED ORDER — IOPAMIDOL (ISOVUE-300) INJECTION 61%
100.0000 mL | Freq: Once | INTRAVENOUS | Status: AC | PRN
  Administered 2017-08-05: 100 mL via INTRAVENOUS

## 2017-08-10 ENCOUNTER — Encounter: Payer: Self-pay | Admitting: Gastroenterology

## 2017-08-10 ENCOUNTER — Telehealth: Payer: Self-pay | Admitting: Gastroenterology

## 2017-08-10 NOTE — Telephone Encounter (Signed)
Let patient know that Dr. Myrtie Neitheranis has been out of the office for a couple of days, but will be back tomorrow (Tuesday) and will let her know about results.

## 2017-08-11 ENCOUNTER — Telehealth: Payer: Self-pay | Admitting: Family Medicine

## 2017-08-11 ENCOUNTER — Telehealth: Payer: Self-pay | Admitting: Gastroenterology

## 2017-08-11 NOTE — Telephone Encounter (Signed)
Copied from CRM #[id number]. Topic: Quick Communication - See Telephone Encounter >> Aug 11, 2017 11:28 AM [name], [name] wrote: Patient called and would like to speak to Dr. Para Marchuncan about her CT scan and the spots on her lungs. Please call patient back, thanks.

## 2017-08-11 NOTE — Telephone Encounter (Signed)
Please let her know I'll send information when I have reviewed the imaging.

## 2017-08-11 NOTE — Telephone Encounter (Signed)
Patient advised of results, see CT scan result note.

## 2017-08-11 NOTE — Telephone Encounter (Signed)
Called an spoke with pt informing her that she will receive a call when Dr. Para Marchuncan receives and reviews result. Pt verbalized understanding nothing further needed at this time.

## 2017-08-18 ENCOUNTER — Encounter: Payer: Self-pay | Admitting: Gastroenterology

## 2017-08-31 ENCOUNTER — Other Ambulatory Visit: Payer: Self-pay

## 2017-08-31 ENCOUNTER — Encounter: Payer: Self-pay | Admitting: Gastroenterology

## 2017-08-31 ENCOUNTER — Ambulatory Visit (AMBULATORY_SURGERY_CENTER): Payer: 59 | Admitting: Gastroenterology

## 2017-08-31 VITALS — BP 109/75 | HR 55 | Temp 98.7°F | Resp 19 | Ht 62.0 in | Wt 187.0 lb

## 2017-08-31 DIAGNOSIS — R1033 Periumbilical pain: Secondary | ICD-10-CM | POA: Diagnosis not present

## 2017-08-31 DIAGNOSIS — R140 Abdominal distension (gaseous): Secondary | ICD-10-CM

## 2017-08-31 DIAGNOSIS — K5900 Constipation, unspecified: Secondary | ICD-10-CM

## 2017-08-31 MED ORDER — HYOSCYAMINE SULFATE 0.125 MG SL SUBL: 0.1250 mg | SUBLINGUAL_TABLET | Freq: Three times a day (TID) | SUBLINGUAL | 1 refills | Status: DC

## 2017-08-31 MED ORDER — SODIUM CHLORIDE 0.9 % IV SOLN: 500.0000 mL | INTRAVENOUS | Status: DC

## 2017-08-31 NOTE — Patient Instructions (Addendum)
YOU HAD AN ENDOSCOPIC PROCEDURE TODAY AT THE [facility]:   Refer to the procedure report that was given to you for any specific questions about what was found during the examination.  If the procedure report does not answer your questions, please call your gastroenterologist to clarify.  If you requested that your care partner not be given the details of your procedure findings, then the procedure report has been included in a sealed envelope for you to review at your convenience later.  YOU SHOULD EXPECT: Some feelings of bloating in the abdomen. Passage of more gas than usual.  Walking can help get rid of the air that was put into your GI tract during the procedure and reduce the bloating. If you had a lower endoscopy (such as a colonoscopy or flexible sigmoidoscopy) you may notice spotting of blood in your stool or on the toilet paper. If you underwent a bowel prep for your procedure, you may not have a normal bowel movement for a few days.  Please Note:  You might notice some irritation and congestion in your nose or some drainage.  This is from the oxygen used during your procedure.  There is no need for concern and it should clear up in a day or so.  SYMPTOMS TO REPORT IMMEDIATELY:   Following lower endoscopy (colonoscopy or flexible sigmoidoscopy):  Excessive amounts of blood in the stool  Significant tenderness or worsening of abdominal pains  Swelling of the abdomen that is new, acute  Fever of 100F or higher   Following upper endoscopy (EGD)  Vomiting of blood or coffee ground material  New chest pain or pain under the shoulder blades  Painful or persistently difficult swallowing  New shortness of breath  Fever of 100F or higher  Black, tarry-looking stools  For urgent or emergent issues, a gastroenterologist can be reached at any hour by calling [phone].   DIET:  We do recommend a small meal at first, but then you may proceed to your regular diet.  Drink  plenty of fluids but you should avoid alcoholic beverages for 24 hours.  ACTIVITY:  You should plan to take it easy for the rest of today and you should NOT DRIVE or use heavy machinery until tomorrow (because of the sedation medicines used during the test).    FOLLOW UP: Our staff will call the number listed on your records the next business day following your procedure to check on you and address any questions or concerns that you may have regarding the information given to you following your procedure. If we do not reach you, we will leave a message.  However, if you are feeling well and you are not experiencing any problems, there is no need to return our call.  We will assume that you have returned to your regular daily activities without incident.  If any biopsies were taken you will be contacted by phone or by letter within the next 1-3 weeks.  Please call us at [phone] if you have not heard about the biopsies in 3 weeks.   Follow up with Dr. Myrtie Neitheranis in 4-6 weeks Start Levsin as prescribed    SIGNATURES/CONFIDENTIALITY: You and/or your care partner have signed paperwork which will be entered into your electronic medical record.  These signatures attest to the fact that that the information above on your After Visit Summary has been reviewed and is understood.  Full responsibility of the confidentiality of this discharge information lies with you and/or  your care-partner. 

## 2017-08-31 NOTE — Progress Notes (Signed)
A/ox3 pleased with MAC, report to [facility]

## 2017-08-31 NOTE — Op Note (Signed)
[name] Patient Name: Bridget GlazierJennifer [name] Procedure Date: 08/31/2017 2:33 PM MRN: [id number] Endoscopist: [name] Age: 36 Referring [name]:  Date of Birth: 10-Oct-1981 Gender: Female Account #: [id number] Procedure:                Upper GI endoscopy Indications:              Periumbilical abdominal pain Medicines:                Monitored Anesthesia Care Procedure:                Pre-Anesthesia Assessment:                           - Prior to the procedure, a History and Physical                            was performed, and patient medications and                            allergies were reviewed. The patient's tolerance of                            previous anesthesia was also reviewed. The risks                            and benefits of the procedure and the sedation                            options and risks were discussed with the patient.                            All questions were answered, and informed consent                            was obtained. Prior Anticoagulants: The patient has                            taken no previous anticoagulant or antiplatelet                            agents. ASA Grade Assessment: II - A patient with                            mild systemic disease. After reviewing the risks                            and benefits, the patient was deemed in                            satisfactory condition to undergo the procedure.                           After obtaining informed consent, the endoscope was  passed under direct vision. Throughout the                            procedure, the patient's blood pressure, pulse, and                            oxygen saturations were monitored continuously. The                            Endoscope was introduced through the mouth, and                            advanced to the second part of duodenum. The upper                            GI endoscopy was  accomplished without difficulty.                            The patient tolerated the procedure well. Scope In: Scope Out: Findings:                 The esophagus was normal.                           The stomach was normal.                           The cardia and gastric fundus were normal on                            retroflexion.                           The examined duodenum was normal. Complications:            No immediate complications. Estimated Blood Loss:     Estimated blood loss: none. Impression:               - Normal esophagus.                           - Normal stomach.                           - Normal examined duodenum.                           - No specimens collected. Recommendation:           - Patient has a contact number available for                            emergencies. The signs and symptoms of potential                            delayed complications were discussed with the  patient. Return to normal activities tomorrow.                            Written discharge instructions were provided to the                            patient.                           - Resume previous diet.                           - Continue present medications.                           - See the other procedure note for documentation of                            additional recommendations. [name] L. Myrtie Neitheranis, [name] 08/31/2017 3:07:07 PM This report has been signed electronically.

## 2017-08-31 NOTE — Progress Notes (Signed)
Dental advisory given to patient 

## 2017-08-31 NOTE — Op Note (Signed)
[facility] Patient Name: Bridget GlazierJennifer [name] Procedure Date: 08/31/2017 2:32 PM MRN: [id number] Endoscopist: Sherilyn CooterHenry L. Myrtie Neitheranis , MD Age: [age] Referring MD:  Date of Birth: 24-Jan-1981 Gender: Female Account #: [id number] Procedure:                Colonoscopy Indications:              Periumbilical abdominal pain Medicines:                Monitored Anesthesia Care Procedure:                Pre-Anesthesia Assessment:                           - Prior to the procedure, a History and Physical                            was performed, and patient medications and                            allergies were reviewed. The patient's tolerance of                            previous anesthesia was also reviewed. The risks                            and benefits of the procedure and the sedation                            options and risks were discussed with the patient.                            All questions were answered, and informed consent                            was obtained. Prior Anticoagulants: The patient has                            taken no previous anticoagulant or antiplatelet                            agents. ASA Grade Assessment: II - A patient with                            mild systemic disease. After reviewing the risks                            and benefits, the patient was deemed in                            satisfactory condition to undergo the procedure.                           After obtaining informed consent, the colonoscope  was passed under direct vision. Throughout the                            procedure, the patient's blood pressure, pulse, and                            oxygen saturations were monitored continuously. The                            Colonoscope was introduced through the anus and                            advanced to the the terminal ileum. The colonoscopy                            was performed without  difficulty. The patient                            tolerated the procedure well. The quality of the                            bowel preparation was excellent. The terminal                            ileum, ileocecal valve, appendiceal orifice, and                            rectum were photographed. The quality of the bowel                            preparation was evaluated using the BBPS [employer]([place]                            Bowel Preparation Scale) with scores of: Right                            Colon = 3, Transverse Colon = 3 and Left Colon = 3                            (entire mucosa seen well with no residual staining,                            small fragments of stool or opaque liquid). The                            total BBPS score equals 9. The bowel preparation                            used was SUPREP. Scope In: 2:54:02 PM Scope Out: 3:04:02 PM Scope Withdrawal Time: 0 hours 6 minutes 31 seconds  Total Procedure Duration: 0 hours 10 minutes 0 seconds  Findings:  The perianal and digital rectal examinations were                            normal.                           The terminal ileum appeared normal.                           The entire examined colon appeared normal on direct                            and retroflexion views. Complications:            No immediate complications. Estimated Blood Loss:     Estimated blood loss: none. Impression:               - The examined portion of the ileum was normal.                           - The entire examined colon is normal on direct and                            retroflexion views.                           - No specimens collected.                           - The overall clinical picture (and negative                            endoscopic&radiologic workup thus far as well as                            continued symptoms after cholecystectomy) is most                            consistent with  IBS. Recommendation:           - Patient has a contact number available for                            emergencies. The signs and symptoms of potential                            delayed complications were discussed with the                            patient. Return to normal activities tomorrow.                            Written discharge instructions were provided to the                            patient.                           -  Resume previous diet.                           - Continue present medications.                           - No recommendation at this time regarding repeat                            colonoscopy due to age.                           - Use Levsin, NuLev (hyoscyamine) 0.125 mg 1-2 tabs                            by mouth , 2-3 times daily as needed for abdominal                            cramps. Disp #60, Refill 1                           - Follow up with me in clinic in 4-6 weeks. [name] L. Myrtie Neither, MD 08/31/2017 3:11:08 PM This report has been signed electronically.

## 2017-09-01 ENCOUNTER — Telehealth: Payer: Self-pay

## 2017-09-01 NOTE — Telephone Encounter (Signed)
  Follow up Call-  Call back number 08/31/2017  Post procedure Call Back phone  # [phone]  Permission to leave phone message Yes  Some recent data might be hidden     Patient questions:  Do you have a fever, pain , or abdominal swelling? No. Pain Score  0 *  Have you tolerated food without any problems? Yes.    Have you been able to return to your normal activities? Yes.    Do you have any questions about your discharge instructions: Diet   No. Medications  No. Follow up visit  No.  Do you have questions or concerns about your Care? No.  Actions: * If pain score is 4 or above: No action needed, pain <4.

## 2017-09-16 ENCOUNTER — Telehealth: Payer: Self-pay

## 2017-09-16 NOTE — Telephone Encounter (Signed)
Tried calling patient to schedule follow up visit on 11/13, no answer. Patient has still not made a follow up visit yet, I have sent her a reminder letter to contact our office to schedule.

## 2017-12-15 ENCOUNTER — Ambulatory Visit: Payer: 59 | Admitting: Family Medicine

## 2017-12-18 ENCOUNTER — Ambulatory Visit (INDEPENDENT_AMBULATORY_CARE_PROVIDER_SITE_OTHER)
Admission: RE | Admit: 2017-12-18 | Discharge: 2017-12-18 | Disposition: A | Discharge status: Home / Self Care | Payer: 59 | Source: Ambulatory Visit | Attending: Family Medicine | Admitting: Family Medicine

## 2017-12-18 ENCOUNTER — Encounter: Payer: Self-pay | Admitting: Family Medicine

## 2017-12-18 ENCOUNTER — Ambulatory Visit (INDEPENDENT_AMBULATORY_CARE_PROVIDER_SITE_OTHER): Payer: 59 | Admitting: Family Medicine

## 2017-12-18 VITALS — BP 102/70 | HR 66 | Temp 98.2°F | Wt 191.0 lb

## 2017-12-18 DIAGNOSIS — R109 Unspecified abdominal pain: Secondary | ICD-10-CM

## 2017-12-18 DIAGNOSIS — M5416 Radiculopathy, lumbar region: Secondary | ICD-10-CM

## 2017-12-18 DIAGNOSIS — E039 Hypothyroidism, unspecified: Secondary | ICD-10-CM | POA: Diagnosis not present

## 2017-12-18 DIAGNOSIS — D509 Iron deficiency anemia, unspecified: Secondary | ICD-10-CM | POA: Diagnosis not present

## 2017-12-18 LAB — CBC WITH DIFFERENTIAL/PLATELET
BASOS ABS: 0 10*3/uL (ref 0.0–0.1)
Basophils Relative: 0.2 % (ref 0.0–3.0)
EOS PCT: 2.5 % (ref 0.0–5.0)
Eosinophils Absolute: 0.2 10*3/uL (ref 0.0–0.7)
HEMATOCRIT: 39.1 % (ref 36.0–46.0)
HEMOGLOBIN: 13.3 g/dL (ref 12.0–15.0)
LYMPHS PCT: 23.7 % (ref 12.0–46.0)
Lymphs Abs: 1.5 10*3/uL (ref 0.7–4.0)
MCHC: 34.1 g/dL (ref 30.0–36.0)
MCV: 87.4 fl (ref 78.0–100.0)
MONOS PCT: 6.6 % (ref 3.0–12.0)
Monocytes Absolute: 0.4 10*3/uL (ref 0.1–1.0)
Neutro Abs: 4.1 10*3/uL (ref 1.4–7.7)
Neutrophils Relative %: 67 % (ref 43.0–77.0)
Platelets: 255 10*3/uL (ref 150.0–400.0)
RBC: 4.47 Mil/uL (ref 3.87–5.11)
RDW: 12.9 % (ref 11.5–15.5)
WBC: 6.2 10*3/uL (ref 4.0–10.5)

## 2017-12-18 LAB — IRON: IRON: 67 ug/dL (ref 42–145)

## 2017-12-18 LAB — TSH: TSH: 2.83 u[IU]/mL (ref 0.35–4.50)

## 2017-12-18 MED ORDER — PREDNISONE 20 MG PO TABS: ORAL_TABLET | ORAL | 0 refills | Status: DC

## 2017-12-18 MED ORDER — LEVOTHYROXINE SODIUM 125 MCG PO TABS: ORAL_TABLET | ORAL | Status: DC

## 2017-12-18 NOTE — Patient Instructions (Addendum)
Go to the lab on the way out.  We'll contact you with your lab report. Prednisone with food.  Update me as needed.  I think the abdominal and back pain are from different areas but I need to consider options.  Take care.  Glad to see you.

## 2017-12-18 NOTE — Progress Notes (Signed)
Started about 2 weeks ago right lower back with radiculopathy.  This is new.  No L sided leg or back sx.  Radiates to the R knee, intermittently w/o clear trigger.  No trauma, no clear issue to cause the pain.  Tingling pain in dermatomal distribution on the R leg.    She still has some RUQ abd pain, for which she has seen GI, etc.  She is waking with pain.  Taking tylenol for the pain.  The abd pain is an old finding but worse recently.  This change predates the back pain.  Having BMs every 3-4 days, but that is lifelong status for patient and unchanged.  Stool softeners and Levsin didn't help prev.  No trigger foods.  She isn't ttp but had pain with a deep breath.    Due for f/u TSH.  Had been taking total of 7.5 tabs in a week. She is off iron and due for f/u labs.      D/w pt about talking to pulmonary again.  See prev notes.   PMH and SH reviewed  ROS: Per HPI unless specifically indicated in ROS section   Meds, vitals, and allergies reviewed.   GEN: nad, alert and oriented HEENT: mucous membranes moist NECK: supple w/o LA CV: rrr PULM: ctab, no inc wob ABD: soft, +bs, with some discomfort in the RUQ EXT: no edema R SLR positive causing more back pain but not a radicular pain at time of exam.  S/s wnl distally.

## 2017-12-21 DIAGNOSIS — M5416 Radiculopathy, lumbar region: Secondary | ICD-10-CM | POA: Insufficient documentation

## 2017-12-21 NOTE — Assessment & Plan Note (Addendum)
Still with unclear source.  I question if HIDA would be useful at this point, given that she has h/o cholecystectomy.  I want to consider options.  I will ask for GI input.  D/w pt.  >25 minutes spent in face to face time with patient, >50% spent in counselling or coordination of care.

## 2017-12-21 NOTE — Assessment & Plan Note (Signed)
No tmg on exam.  Due for f/u TSH.  Had been taking total of 7.5 tabs in a week. See notes on labs.

## 2017-12-21 NOTE — Assessment & Plan Note (Signed)
Likely sciatica, d/w pt. Prednisone with food.  Routine steroid cautions d/w pt.   I think this is different from the abd pain.  She agrees.

## 2017-12-21 NOTE — Assessment & Plan Note (Signed)
See notes on labs.  Off iron.

## 2017-12-27 ENCOUNTER — Telehealth: Payer: Self-pay | Admitting: Family Medicine

## 2017-12-27 NOTE — Telephone Encounter (Signed)
Call pt. See how she is doing with the radicular sx after taking prednisone.     I checked with GI.  The thought per GI was that she had a functional bowel disorder.  We can try a course of SSRI to see if that makes a difference, or have her see GI again (or get a second opinion).  I'm okay with either of those.  Have her consider and let me know.  Thanks.

## 2017-12-28 NOTE — Telephone Encounter (Signed)
Left detailed message on voicemail. DPR 

## 2018-02-18 ENCOUNTER — Other Ambulatory Visit: Payer: Self-pay | Admitting: Family Medicine

## 2018-02-18 DIAGNOSIS — E039 Hypothyroidism, unspecified: Secondary | ICD-10-CM

## 2018-02-19 NOTE — Telephone Encounter (Signed)
Electronic refill request. Levothyroxine Last office visit:   12/18/17 Last Filled:   12/18/17 No print Please advise.

## 2018-02-21 NOTE — Telephone Encounter (Signed)
Sent. Thanks.   

## 2018-12-28 ENCOUNTER — Other Ambulatory Visit: Payer: Self-pay | Admitting: Family Medicine

## 2018-12-28 DIAGNOSIS — E039 Hypothyroidism, unspecified: Secondary | ICD-10-CM

## 2018-12-29 NOTE — Telephone Encounter (Signed)
Please call patient and schedule CPE and lab work. Send back for refill after appointment has been scheduled.

## 2018-12-29 NOTE — Telephone Encounter (Signed)
Pt is scheduled for lab appt 01/17/19 @ 8am and physical on 01/20/19 @ 12pm.

## 2018-12-30 ENCOUNTER — Encounter: Payer: Self-pay | Admitting: Gynecology

## 2018-12-30 ENCOUNTER — Other Ambulatory Visit: Payer: Self-pay

## 2018-12-30 ENCOUNTER — Ambulatory Visit
Admission: EM | Admit: 2018-12-30 | Discharge: 2018-12-30 | Disposition: A | Discharge status: Home / Self Care | Payer: 59 | Attending: Emergency Medicine | Admitting: Emergency Medicine

## 2018-12-30 DIAGNOSIS — R6883 Chills (without fever): Secondary | ICD-10-CM | POA: Diagnosis not present

## 2018-12-30 DIAGNOSIS — Z87891 Personal history of nicotine dependence: Secondary | ICD-10-CM

## 2018-12-30 DIAGNOSIS — M791 Myalgia, unspecified site: Secondary | ICD-10-CM

## 2018-12-30 DIAGNOSIS — J101 Influenza due to other identified influenza virus with other respiratory manifestations: Secondary | ICD-10-CM

## 2018-12-30 DIAGNOSIS — R0981 Nasal congestion: Secondary | ICD-10-CM

## 2018-12-30 DIAGNOSIS — R05 Cough: Secondary | ICD-10-CM

## 2018-12-30 LAB — RAPID INFLUENZA A&B ANTIGENS
Influenza A (ARMC): NEGATIVE
Influenza B (ARMC): POSITIVE — AB

## 2018-12-30 MED ORDER — ALBUTEROL SULFATE HFA 108 (90 BASE) MCG/ACT IN AERS: 2.0000 | INHALATION_SPRAY | RESPIRATORY_TRACT | 0 refills | Status: DC | PRN

## 2018-12-30 MED ORDER — HYDROCOD POLST-CPM POLST ER 10-8 MG/5ML PO SUER: 5.0000 mL | Freq: Every evening | ORAL | 0 refills | Status: DC | PRN

## 2018-12-30 MED ORDER — BENZONATATE 100 MG PO CAPS: 100.0000 mg | ORAL_CAPSULE | Freq: Three times a day (TID) | ORAL | 0 refills | Status: DC | PRN

## 2018-12-30 MED ORDER — AZITHROMYCIN 250 MG PO TABS: ORAL_TABLET | ORAL | 0 refills | Status: DC

## 2018-12-30 NOTE — ED Provider Notes (Signed)
[facility] ____________________________________________  Time seen: Approximately 6:31 PM  I have reviewed the triage vital signs and the nursing notes.   HISTORY  Chief Complaint No chief complaint on file.  HPI [name] is a 38 y.o. female with history of asthma and eosinophilic pneumonia presenting for evaluation of cough, congestion, chills, body aches and fever for the last 5 days.  States symptoms were quick in onset.  Has been taken over-the-counter Tylenol, ibuprofen and cough congestion medications.  States the cough is disrupting her sleep.  Unsure of wheezing but does have some intermittent tightness in her chest similar to previous asthma exacerbations.  Denies chest pain.  Some soreness from coughing.  Sore throat intermittently from coughing, denies other sore throat.  States that her albuterol inhaler has expired and requests a refill.  Continues to eat and drink well.  States that her kids had influenza a few weeks ago but denies known direct sick contacts recently.  No hemoptysis.  Denies extremity edema.  Denies other aggravating leaving factors.  Denies current pregnancy.  Joaquim Nam, MD: PCP   Past Medical History:  Diagnosis Date  . Anemia    Iron Deficiency  . Asthma    in childhood  . Complication of anesthesia    HARD TO WAKE UP SOMETIIMES  . Endometriosis    dx'd in high school  . Eosinophilic pneumonia (HCC) ~08/2007   per Dr. Beverely Risen s/p VATS and 1 year of prednisone  . GERD (gastroesophageal reflux disease)    TUMS PRN  . Headache    MIGRAINES  . History of abnormal Pap smear   . History of chicken pox   . Hypothyroidism    dx'd 2011  . Pneumonia 2009   EOSINOPHILIC PNEUMONIA  . PONV (postoperative nausea and vomiting)   . Seizures (HCC)    childhood-previously on Tegretol-been off meds for years with no seizures  . Urinary tract bacterial infections     Patient Active Problem List   Diagnosis Date Noted  .  Lumbar radicular pain 12/21/2017  . Advance care planning 05/04/2017  . Right sided abdominal pain 12/03/2016  . Calculus of gallbladder without cholecystitis without obstruction 08/05/2016  . Anemia 07/31/2016  . Abdominal bloating 07/30/2016  . Migraine without aura 04/18/2012  . Cough 02/06/2012  . Eosinophilic pneumonia (HCC) 02/07/2011  . Hypothyroidism 02/07/2011    Past Surgical History:  Procedure Laterality Date  . CESAREAN SECTION  2007  . CHOLECYSTECTOMY N/A 10/27/2016   Procedure: LAPAROSCOPIC CHOLECYSTECTOMY;  Surgeon: Leafy Ro, MD;  Location: [facility] ORS;  Service: General;  Laterality: N/A;  . DIAGNOSTIC LAPAROSCOPY     X2  . LUNG BIOPSY  2008  . TONSILLECTOMY AND ADENOIDECTOMY       No current facility-administered medications for this encounter.   Current Outpatient Medications:  .  albuterol (PROVENTIL) (5 MG/ML) 0.5% nebulizer solution, Take 2.5 mg by nebulization every 6 (six) hours as needed for wheezing or shortness of breath., Disp: , Rfl:  .  levothyroxine (SYNTHROID, LEVOTHROID) 125 MCG tablet, TAKE 1 TABLET BY MOUTH  DAILY EXCEPT TAKE 1 AND 1/2 TABLETS ON SUNDAYS, Disp: 98 tablet, Rfl: 0 .  albuterol (PROVENTIL HFA;VENTOLIN HFA) 108 (90 Base) MCG/ACT inhaler, Inhale 2 puffs into the lungs every 4 (four) hours as needed for wheezing or shortness of breath., Disp: 1 Inhaler, Rfl: 0 .  azithromycin (ZITHROMAX Z-PAK) 250 MG tablet, Take 2 tablets (500 mg) on  Day 1,  followed by  1 tablet (250 mg) once daily on Days 2 through 5., Disp: 6 each, Rfl: 0 .  benzonatate (TESSALON PERLES) 100 MG capsule, Take 1 capsule (100 mg total) by mouth 3 (three) times daily as needed for cough., Disp: 15 capsule, Rfl: 0 .  chlorpheniramine-HYDROcodone (TUSSIONEX PENNKINETIC ER) 10-8 MG/5ML SUER, Take 5 mLs by mouth at bedtime as needed for cough. do not drive or operate machinery while taking as can cause drowsiness., Disp: 50 mL, Rfl: 0  Allergies Patient has no known  allergies.  Family History  Problem Relation Age of Onset  . Mental illness Mother        bipolar  . Cancer Father        h/o throat cancer- treated  . Heart disease Maternal Grandfather        at ate >60  . Alcohol abuse Maternal Grandfather   . Stomach cancer Neg Hx   . Rectal cancer Neg Hx   . Esophageal cancer Neg Hx   . Colon cancer Neg Hx     Social History Social History   Tobacco Use  . Smoking status: Former Smoker    Packs/day: 0.50    Years: 10.00    Pack years: 5.00    Types: Cigarettes    Last attempt to quit: 10/21/2004    Years since quitting: 14.2  . Smokeless tobacco: Never Used  Substance Use Topics  . Alcohol use: Yes    Comment: rare  . Drug use: No    Review of Systems Constitutional: Positive fevers. ENT: As above Cardiovascular: Denies chest pain. Respiratory: As above.  Gastrointestinal: No abdominal pain.  No nausea, no vomiting.  No diarrhea.   Genitourinary: Negative for dysuria. Musculoskeletal: Negative for back pain. Skin: Negative for rash.   ____________________________________________   PHYSICAL EXAM:  VITAL SIGNS: ED Triage Vitals [12/30/18 1717]  Enc Vitals Group     BP 107/81     Pulse Rate (!) 105     Resp 16     Temp 99.5 F (37.5 C)     Temp Source Oral     SpO2 100 %     Weight 189 lb (85.7 kg)     Height  (1.575 m)     Head Circumference      Peak Flow      Pain Score 10     Pain Loc      Pain Edu?      Excl. in GC?     Constitutional: Alert and oriented. Well appearing and in no acute distress. Eyes: Conjunctivae are normal. Head: Atraumatic. No sinus tenderness to palpation. No swelling. No erythema.  Ears: no erythema, normal TMs bilaterally.   Nose:Nasal congestion  Mouth/Throat: Mucous membranes are moist. Mild pharyngeal erythema. No tonsillar swelling or exudate.  Neck: No stridor.  No cervical spine tenderness to palpation. Hematological/Lymphatic/Immunilogical: No cervical  lymphadenopathy. Cardiovascular: Normal rate, regular rhythm. Grossly normal heart sounds.  Good peripheral circulation. Respiratory: Normal respiratory effort.  No retractions. No wheezes.  Bilateral base rhonchi. Good air movement.  Dry intermittent cough noted in room. Musculoskeletal: Ambulatory with steady gait.  No lower extremity edema noted bilaterally. Neurologic:  Normal speech and language. No gait instability. Skin:  Skin appears warm, dry and intact. No rash noted. Psychiatric: Mood and affect are normal. Speech and behavior are normal. ___________________________________________   LABS (all labs ordered are listed, but only abnormal results are displayed)  Labs Reviewed  RAPID INFLUENZA A&B ANTIGENS ([facility] ONLY) -  Abnormal; Notable for the following components:      Result Value   Influenza B ([facility]) POSITIVE (*)    All other components within normal limits    PROCEDURES Procedures    INITIAL IMPRESSION / ASSESSMENT AND PLAN / ED COURSE  Pertinent labs & imaging results that were available during my care of the patient were reviewed by me and considered in my medical decision making (see chart for details).  Well-appearing patient.  No acute distress.  Suspect influenza.  Influenza B positive.  Also concern for secondary infection.  Will treat patient with oral azithromycin, PRN Tussionex and PRN Tessalon Perles.  Albuterol inhaler refill given.  Encourage rest, fluids, supportive care.  Discussed strict follow-up and return parameters.Discussed indication, risks and benefits of medications with patient.  Discussed follow up with Primary care physician this week. Discussed follow up and return parameters including no resolution or any worsening concerns. Patient verbalized understanding and agreed to plan.   ____________________________________________   FINAL CLINICAL IMPRESSION(S) / ED DIAGNOSES  Final diagnoses:  Influenza B     ED Discharge Orders          Ordered    azithromycin (ZITHROMAX Z-PAK) 250 MG tablet     12/30/18 1824    albuterol (PROVENTIL HFA;VENTOLIN HFA) 108 (90 Base) MCG/ACT inhaler  Every 4 hours PRN     12/30/18 1824    chlorpheniramine-HYDROcodone (TUSSIONEX PENNKINETIC ER) 10-8 MG/5ML SUER  At bedtime PRN     12/30/18 1824    benzonatate (TESSALON PERLES) 100 MG capsule  3 times daily PRN     12/30/18 1824           Note: This dictation was prepared with Dragon dictation along with smaller phrase technology. Any transcriptional errors that result from this process are unintentional.         Renford Dills, NP 12/30/18 1950

## 2018-12-30 NOTE — ED Triage Notes (Signed)
Patient c/o fever/ body ace / chills x 5 days. Patient request refill on her albuterol

## 2018-12-30 NOTE — Discharge Instructions (Addendum)
Take medication as prescribed. Rest. Drink plenty of fluids. Continue over the counter tylenol and ibuprofen as needed.   Follow up with your primary care physician this week as needed. Return to Urgent care for new or worsening concerns.

## 2019-01-17 ENCOUNTER — Other Ambulatory Visit: Payer: 59

## 2019-01-20 ENCOUNTER — Encounter: Payer: 59 | Admitting: Family Medicine

## 2019-01-26 ENCOUNTER — Other Ambulatory Visit: Payer: Self-pay

## 2019-01-26 ENCOUNTER — Encounter: Payer: Self-pay | Admitting: Family Medicine

## 2019-01-26 ENCOUNTER — Ambulatory Visit (INDEPENDENT_AMBULATORY_CARE_PROVIDER_SITE_OTHER): Payer: 59 | Admitting: Family Medicine

## 2019-01-26 VITALS — Ht 62.0 in

## 2019-01-26 DIAGNOSIS — J22 Unspecified acute lower respiratory infection: Secondary | ICD-10-CM | POA: Insufficient documentation

## 2019-01-26 MED ORDER — PREDNISONE 20 MG PO TABS: ORAL_TABLET | ORAL | 0 refills | Status: DC

## 2019-01-26 MED ORDER — AZITHROMYCIN 250 MG PO TABS: ORAL_TABLET | ORAL | 0 refills | Status: DC

## 2019-01-26 NOTE — Progress Notes (Signed)
Virtual visit completed through [employer].  Patient location: home Provider location: [place] [facility], office If any vitals were documented, they were collected by patient at home unless specified below.    Ht 5\' 2"  (1.575 m)   BMI 34.57 kg/m    CC: cough, dyspnea Subjective:    Patient ID: Bridget BiblesJennifer C [name], female    DOB: Apr 08, 1981, 38 y.o.   MRN: [id number]  HPI: [name] is a 38 y.o. female presenting on 01/26/2019 for Cough (Dx with pneumonia on 12/30/18. Cough has improved, now dry. ) and Shortness of Breath (Still has tightness in chest. )   [facility] with dx influenza B and PNA treated with zpack with improvement. She was also treated with albuterol and hydrocodone cough syrup. Continues using albuterol TID.   Still notes persistent dyspnea, mildly productive cough (improving). Feels dyspnea is plateaued. Some PNdrainage. Mild pleuritic chest pain.   No fevers/chills, ear or tooth pain, ST, wheezing, HA.  No sick contacts at home.  She has been staying at home with children for the past several weeks. No known coronavirus exposure or recent travel.   H/o eosinophilic pneumonia (HCC) ~08/2007 per Dr. Beverely RisenFozia Khan s/p VATS and 1 year of prednisone.       Relevant past medical, surgical, family and social history reviewed and updated as indicated. Interim medical history since our last visit reviewed. Allergies and medications reviewed and updated. Outpatient Medications Prior to Visit  Medication Sig Dispense Refill  . albuterol (PROVENTIL HFA;VENTOLIN HFA) 108 (90 Base) MCG/ACT inhaler Inhale 2 puffs into the lungs every 4 (four) hours as needed for wheezing or shortness of breath. 1 Inhaler 0  . levothyroxine (SYNTHROID, LEVOTHROID) 125 MCG tablet TAKE 1 TABLET BY MOUTH  DAILY EXCEPT TAKE 1 AND 1/2 TABLETS ON SUNDAYS 98 tablet 0  . albuterol (PROVENTIL) (5 MG/ML) 0.5% nebulizer solution Take 2.5 mg by nebulization every 6 (six) hours as needed  for wheezing or shortness of breath.    Marland Kitchen. azithromycin (ZITHROMAX Z-PAK) 250 MG tablet Take 2 tablets (500 mg) on  Day 1,  followed by 1 tablet (250 mg) once daily on Days 2 through 5. 6 each 0  . benzonatate (TESSALON PERLES) 100 MG capsule Take 1 capsule (100 mg total) by mouth 3 (three) times daily as needed for cough. 15 capsule 0  . chlorpheniramine-HYDROcodone (TUSSIONEX PENNKINETIC ER) 10-8 MG/5ML SUER Take 5 mLs by mouth at bedtime as needed for cough. do not drive or operate machinery while taking as can cause drowsiness. 50 mL 0   No facility-administered medications prior to visit.      Per HPI unless specifically indicated in ROS section below Review of Systems Objective:    Ht 5\' 2"  (1.575 m)   BMI 34.57 kg/m   Wt Readings from Last 3 Encounters:  12/30/18 189 lb (85.7 kg)  12/18/17 191 lb (86.6 kg)  08/31/17 187 lb (84.8 kg)     Physical exam: Gen: alert, NAD, not ill appearing Pulm: speaks in complete sentences without increased work of breathing. Minimal cough present Psych: normal mood, normal thought content      Results for orders placed or performed during the hospital encounter of 12/30/18  Rapid Influenza A&B Antigens ([facility] only)  Result Value Ref Range   Influenza A ([facility]) NEGATIVE NEGATIVE   Influenza B ([facility]) POSITIVE (A) NEGATIVE   Assessment & Plan:   Problem List Items Addressed This Visit    Lower respiratory  infection - Primary    Initial improvement after influenza B illness treated with azithromycin last month however with persistent dyspnea, mild productive cough and mild pleurisy in setting of h/o eosinophilic pneumonia 2008 dx by VATS. Largely improving. Will Rx short prednisone taper, with WASP for second azithromycin course with indications when to fill. Red flags to seek further care reviewed. Pt agrees with plan. Advised to call us on Monday for an update - if persistent symptoms, low threshold to come into office for CXR. Doubt covid19  illness at this time.       Relevant Medications   azithromycin (ZITHROMAX Z-PAK) 250 MG tablet       Meds ordered this encounter  Medications  . azithromycin (ZITHROMAX Z-PAK) 250 MG tablet    Sig: Take 2 tablets (500 mg) on  Day 1,  followed by 1 tablet (250 mg) once daily on Days 2 through 5.    Dispense:  6 each    Refill:  0    Take if feverish, or worsening productive cough  . predniSONE (DELTASONE) 20 MG tablet    Sig: Take two tablets daily for 3 days followed by one tablet daily for 4 days    Dispense:  10 tablet    Refill:  0   No orders of the defined types were placed in this encounter.   Follow up plan: No follow-ups on file.  Eustaquio Boyden, MD

## 2019-01-26 NOTE — Assessment & Plan Note (Addendum)
Initial improvement after influenza B illness treated with azithromycin last month however with persistent dyspnea, mild productive cough and mild pleurisy in setting of h/o eosinophilic pneumonia 2008 dx by VATS. Largely improving. Will Rx short prednisone taper, with WASP for second azithromycin course with indications when to fill. Red flags to seek further care reviewed. Pt agrees with plan. Advised to call us on Monday for an update - if persistent symptoms, low threshold to come into office for CXR. Doubt covid19 illness at this time.

## 2019-02-02 ENCOUNTER — Telehealth: Payer: Self-pay | Admitting: Family Medicine

## 2019-02-02 DIAGNOSIS — J22 Unspecified acute lower respiratory infection: Secondary | ICD-10-CM

## 2019-02-02 DIAGNOSIS — R05 Cough: Secondary | ICD-10-CM

## 2019-02-02 DIAGNOSIS — R059 Cough, unspecified: Secondary | ICD-10-CM

## 2019-02-02 NOTE — Telephone Encounter (Signed)
Spoke with pt to get answers to Dr. Timoteo Expose questions.  Pt did not start 2nd zpack because she did not develop a fever.  Says the prednisone did not help her sxs at all.  She still c/o cough, SOB and tightness in her chest. Informed pt at this point Dr. Reece Agar wants a CXR.  Pt will come by tomorrow. Instructed her drive around to the back and call the office if she does not see anyone outside.

## 2019-02-02 NOTE — Telephone Encounter (Signed)
Best number [phone]  Pt called stating she finished her rx of steriod yesterday.  She is not doing any better and wanted to know what the next step is  [facility]

## 2019-02-02 NOTE — Telephone Encounter (Signed)
Did she take 2nd zpack antibiotic course?  Did she feel any better while on prednisone course?  If no better would offer CXR in office - but she would have to wear mask. Would ensure no fever.  CXR ordered.

## 2019-02-03 ENCOUNTER — Other Ambulatory Visit: Payer: Self-pay

## 2019-02-03 ENCOUNTER — Telehealth: Payer: Self-pay

## 2019-02-03 ENCOUNTER — Ambulatory Visit (INDEPENDENT_AMBULATORY_CARE_PROVIDER_SITE_OTHER)
Admission: RE | Admit: 2019-02-03 | Discharge: 2019-02-03 | Disposition: A | Discharge status: Home / Self Care | Payer: 59 | Source: Ambulatory Visit | Attending: Family Medicine | Admitting: Family Medicine

## 2019-02-03 ENCOUNTER — Telehealth: Payer: Self-pay | Admitting: Family Medicine

## 2019-02-03 DIAGNOSIS — J22 Unspecified acute lower respiratory infection: Secondary | ICD-10-CM | POA: Diagnosis not present

## 2019-02-03 DIAGNOSIS — J181 Lobar pneumonia, unspecified organism: Principal | ICD-10-CM

## 2019-02-03 DIAGNOSIS — R05 Cough: Secondary | ICD-10-CM

## 2019-02-03 DIAGNOSIS — R059 Cough, unspecified: Secondary | ICD-10-CM

## 2019-02-03 DIAGNOSIS — J189 Pneumonia, unspecified organism: Secondary | ICD-10-CM

## 2019-02-03 MED ORDER — AMOXICILLIN-POT CLAVULANATE ER 1000-62.5 MG PO TB12: 2.0000 | ORAL_TABLET | Freq: Two times a day (BID) | ORAL | 0 refills | Status: DC

## 2019-02-03 NOTE — Telephone Encounter (Signed)
Dr. Reece Agar is aware and addressing- d/w him.  Routed as FYI.  I appreciate the help of all involved .

## 2019-02-03 NOTE — Telephone Encounter (Signed)
Inbound call to triage - radiology contacted office to report results from X-Ray.  Results visible in EMR. PCP notified.

## 2019-02-03 NOTE — Telephone Encounter (Addendum)
I spoke with patient. RLL CAP by CXR. Start zpack/extended release augmentin course.  plz call pt on Monday for an update on symptoms.  Schedule rpt CXR in 4-6 wks to ensure resolution of infection.

## 2019-02-03 NOTE — Telephone Encounter (Signed)
Copied from CRM [id number]. Topic: Quick Communication - See Telephone Encounter >> Feb 03, 2019  4:51 PM Terisa Starr wrote: CRM for notification. See Telephone encounter for: 02/03/19.  Pt said her insurance does not cover amoxicillin-clavulanate (AUGMENTIN XR) 1000-62.5 MG 12 hr tablet and the pharmacy will be faxing something to the office that is covered. Please advise.

## 2019-02-03 NOTE — Addendum Note (Signed)
Addended by: Eustaquio Boyden on: 02/03/2019 12:56 PM   Modules accepted: Orders

## 2019-02-04 MED ORDER — AMOXICILLIN-POT CLAVULANATE 875-125 MG PO TABS: 1.0000 | ORAL_TABLET | Freq: Two times a day (BID) | ORAL | 0 refills | Status: DC

## 2019-02-04 NOTE — Telephone Encounter (Signed)
Thanks. Plz notify pt regular augmentin sent to pharmacy.

## 2019-02-04 NOTE — Telephone Encounter (Signed)
Patient notified as instructed by telephone and verbalized understanding. 

## 2019-02-04 NOTE — Telephone Encounter (Signed)
Please talk to me about this.  It may be reasonable to change to regular augmentin given the clavulanic acid dose.  Thanks.

## 2019-02-07 NOTE — Telephone Encounter (Signed)
Spoke with pt asking how she is feeling. States she is doing a lot better. The abx are working. I reminded her about returning for rpt CXR in 4-6 wks.  Pt verbalizes understanding.

## 2019-02-07 NOTE — Telephone Encounter (Signed)
Thanks. Noted.

## 2019-02-07 NOTE — Telephone Encounter (Signed)
Thank you :)

## 2019-02-21 ENCOUNTER — Telehealth: Payer: Self-pay | Admitting: Family Medicine

## 2019-02-21 NOTE — Telephone Encounter (Signed)
Patient was seen on 4/8 for Pneumonia.  She called the office today stating that her symptoms started again on Saturday And feels like this maybe returning. She would like to know if she needs to schedule another visit to discuss this or if a medication could be prescribed   PHONE- [phone]

## 2019-02-21 NOTE — Telephone Encounter (Signed)
Yes, please triage patient and schedule as soon as possible with me or Dr. Reece Agar.  Thanks.  Routed to triage.

## 2019-02-21 NOTE — Telephone Encounter (Signed)
I spoke with pt; tightness in chest and  prod cough with phlegm; pt said small amt of phlegm but not enough to spit out so pt swallows. Sometimes can hear rattle in chest (comes and goes). SOB upon exertion.If pt  takes deep breath and pt has sharp pain on rt side of chest; pain only there when taking deep breath.no fever. No travel and no exposure to covid or flu. No distress in breathing. Pt said she is in no distress and virtual appt 02/22/19 at 8:30 is OK. ED precautions given. FYI to Dr Para March.

## 2019-02-21 NOTE — Telephone Encounter (Signed)
plz schedule appt with PCP or myself.

## 2019-02-21 NOTE — Telephone Encounter (Signed)
Noted thanks °

## 2019-02-22 ENCOUNTER — Ambulatory Visit (INDEPENDENT_AMBULATORY_CARE_PROVIDER_SITE_OTHER)
Admission: RE | Admit: 2019-02-22 | Discharge: 2019-02-22 | Disposition: A | Discharge status: Home / Self Care | Payer: 59 | Source: Ambulatory Visit | Attending: Family Medicine | Admitting: Family Medicine

## 2019-02-22 ENCOUNTER — Ambulatory Visit (INDEPENDENT_AMBULATORY_CARE_PROVIDER_SITE_OTHER): Payer: 59 | Admitting: Family Medicine

## 2019-02-22 ENCOUNTER — Other Ambulatory Visit (INDEPENDENT_AMBULATORY_CARE_PROVIDER_SITE_OTHER): Payer: 59

## 2019-02-22 ENCOUNTER — Other Ambulatory Visit: Payer: Self-pay

## 2019-02-22 ENCOUNTER — Encounter: Payer: Self-pay | Admitting: Family Medicine

## 2019-02-22 DIAGNOSIS — J22 Unspecified acute lower respiratory infection: Secondary | ICD-10-CM

## 2019-02-22 DIAGNOSIS — J181 Lobar pneumonia, unspecified organism: Secondary | ICD-10-CM

## 2019-02-22 DIAGNOSIS — J189 Pneumonia, unspecified organism: Secondary | ICD-10-CM

## 2019-02-22 LAB — CBC WITH DIFFERENTIAL/PLATELET
Basophils Absolute: 0 10*3/uL (ref 0.0–0.1)
Basophils Relative: 0.5 % (ref 0.0–3.0)
Eosinophils Absolute: 0.2 10*3/uL (ref 0.0–0.7)
Eosinophils Relative: 4.3 % (ref 0.0–5.0)
HCT: 37 % (ref 36.0–46.0)
Hemoglobin: 12.3 g/dL (ref 12.0–15.0)
Lymphocytes Relative: 27.3 % (ref 12.0–46.0)
Lymphs Abs: 1.3 10*3/uL (ref 0.7–4.0)
MCHC: 33.3 g/dL (ref 30.0–36.0)
MCV: 84.4 fl (ref 78.0–100.0)
Monocytes Absolute: 0.4 10*3/uL (ref 0.1–1.0)
Monocytes Relative: 9.5 % (ref 3.0–12.0)
Neutro Abs: 2.7 10*3/uL (ref 1.4–7.7)
Neutrophils Relative %: 58.4 % (ref 43.0–77.0)
Platelets: 249 10*3/uL (ref 150.0–400.0)
RBC: 4.38 Mil/uL (ref 3.87–5.11)
RDW: 14.2 % (ref 11.5–15.5)
WBC: 4.6 10*3/uL (ref 4.0–10.5)

## 2019-02-22 LAB — BASIC METABOLIC PANEL
BUN: 5 mg/dL — ABNORMAL LOW (ref 6–23)
CO2: 29 mEq/L (ref 19–32)
Calcium: 9 mg/dL (ref 8.4–10.5)
Chloride: 104 mEq/L (ref 96–112)
Creatinine, Ser: 0.7 mg/dL (ref 0.40–1.20)
GFR: 94.74 mL/min (ref >60.00)
Glucose, Bld: 80 mg/dL (ref 70–99)
Potassium: 4 mEq/L (ref 3.5–5.1)
Sodium: 138 mEq/L (ref 135–145)

## 2019-02-22 MED ORDER — AMOXICILLIN-POT CLAVULANATE 875-125 MG PO TABS: 1.0000 | ORAL_TABLET | Freq: Two times a day (BID) | ORAL | 0 refills | Status: DC

## 2019-02-22 MED ORDER — PREDNISONE 20 MG PO TABS: ORAL_TABLET | ORAL | 0 refills | Status: DC

## 2019-02-22 NOTE — Assessment & Plan Note (Signed)
F/u PNA H/o eosinophilic PNA in the distant past.   needs labs and xray today at 1145 AM. Not pregnant with LMP 02/18/2019  ============== Prev and current xray reviewed.   Still with changes on xray, similar to prev.  It seemed (based on conversation with patient) that treatment with augmentin had more effect on sx back in 01/2019.  Would restart augmentin, rx sent.   If more wheeze, then start prednisone.  rx sent for now, for patient to hold.   Can use SABA prn.   I put in referral back to pulmonary.  If worse in the meantime, then to ER.  Would have her update me about her sx in about 2-3 days, sooner if needed.   I am awaiting her labs.

## 2019-02-22 NOTE — Progress Notes (Signed)
Virtual visit completed through WebEx or similar program Patient location: home  Provider location: [place] at [facility], office   Limitations and rationale for visit method d/w patient.  Patient agreed to proceed.   CC: f/u PNA  HPI:  She had seen Dr Reece Agar with post flu/PNA in 01/2019  CXR 02/03/2019 with Focal patchy infiltrate right base. Mild left base atelectasis. Postoperative change noted in right lung. Lungs elsewhere clear. Cardiac silhouette within normal limits.  S/p augmentin and zmax and prednisone.   The plan was f/u cxr in about 1 month but then return of cough in the meantime. She felt better while on abx but then worse now.  She is still working from home.  No exposure to others. No fevers.  Some sputum, mild production, grey. She has some "catch" with a deep breath.  SABA helps with SOB.   She didn't think she needed to be at the hospital.  Minimal wheeze, "it's not terrible."   Pandemic considerations d/w pt.    PMH and SH reviewed.   Meds and allergies reviewed.   ROS: Per HPI unless specifically indicated in ROS section   NAD Speech wnl  A/P:  F/u PNA H/o eosinophilic PNA in the distant past.   needs labs and xray today at 1145 AM. Not pregnant with LMP 02/18/2019  ============== Prev and current xray reviewed.   Still with changes on xray, similar to prev.  It seemed (based on conversation with patient) that treatment with augmentin had more effect on sx back in 01/2019.  Would restart augmentin, rx sent.   If more wheeze, then start prednisone.  rx sent for now, for patient to hold.   Can use SABA prn.   I put in referral back to pulmonary.  If worse in the meantime, then to ER.  Would have her update me about her sx in about 2-3 days, sooner if needed.   I am awaiting her labs.

## 2019-02-23 ENCOUNTER — Telehealth: Payer: Self-pay | Admitting: Family Medicine

## 2019-02-23 NOTE — Telephone Encounter (Signed)
Best number [phone] Pt called checking on xray results.  She received text stating rx is ready and she was wanting to what the meds were for.

## 2019-02-24 ENCOUNTER — Ambulatory Visit: Payer: Self-pay | Admitting: Internal Medicine

## 2019-02-24 NOTE — Telephone Encounter (Signed)
Left detailed message on VM per DPR with results.

## 2019-02-28 ENCOUNTER — Ambulatory Visit: Payer: 59 | Admitting: Internal Medicine

## 2019-02-28 ENCOUNTER — Encounter: Payer: Self-pay | Admitting: Internal Medicine

## 2019-02-28 ENCOUNTER — Other Ambulatory Visit: Payer: Self-pay

## 2019-02-28 VITALS — Ht 62.0 in | Wt 187.0 lb

## 2019-02-28 DIAGNOSIS — J22 Unspecified acute lower respiratory infection: Secondary | ICD-10-CM

## 2019-02-28 DIAGNOSIS — R059 Cough, unspecified: Secondary | ICD-10-CM

## 2019-02-28 DIAGNOSIS — R05 Cough: Secondary | ICD-10-CM

## 2019-02-28 NOTE — Progress Notes (Signed)
[facility] [address] [place], [place] [address]  Internal MEDICINE  Telephone Visit  Patient Name: [name]  [id number]  [id number]  Date of Service: 02/28/2019  I connected with the patient at  1025 by telephone and verified the patients identity using two identifiers.  I discussed the limitations, risks, security and privacy concerns of performing an evaluation and management service by telephone and the availability of in person appointments. I also discussed with the patient that there may be a patient responsible charge related to the service.  The patient expressed understanding and agrees to proceed.    Chief Complaint  Patient presents with  . Telephone Assessment  . Telephone Screen  . New Patient (Initial Visit)    chest pressure and sob,Pt currently on antibiotic and prednisone on hold     HPI PT reports she has been on multiple rounds of antibiotics.   Zpak Zpak with augmentin 3 weeks later she started augmentin again, and is 5 days into the course at this time.  She has not taken any prednisone.  She denies fever.  Has a intermittnelty productive cough that is worse in the morning and at night.  She had a follow up Xray on 02/22/2019 that showed "Stable right basilar subsegmental atelectasis or infiltrate. Stable minimal left basilar subsegmental atelectasis."  Her CBC was WNL. Her main complaint is ongoing chest tightness and SOB when taking a deep breath only.      Current Medication: Outpatient Encounter Medications as of 02/28/2019  Medication Sig  . albuterol (PROVENTIL HFA;VENTOLIN HFA) 108 (90 Base) MCG/ACT inhaler Inhale 2 puffs into the lungs every 4 (four) hours as needed for wheezing or shortness of breath.  Marland Kitchen amoxicillin-clavulanate (AUGMENTIN) 875-125 MG tablet Take 1 tablet by mouth 2 (two) times daily.  Marland Kitchen levothyroxine (SYNTHROID, LEVOTHROID) 125 MCG tablet TAKE 1 TABLET BY MOUTH  DAILY EXCEPT TAKE 1 AND 1/2 TABLETS ON SUNDAYS  .  albuterol (PROVENTIL) (5 MG/ML) 0.5% nebulizer solution Take 2.5 mg by nebulization every 6 (six) hours as needed for wheezing or shortness of breath.  . predniSONE (DELTASONE) 20 MG tablet Take with food.  Take two tablets daily for 3 days followed by one tablet daily for 4 days (Patient not taking: Reported on 02/28/2019)   No facility-administered encounter medications on file as of 02/28/2019.     Surgical History: Past Surgical History:  Procedure Laterality Date  . CESAREAN SECTION  2007  . CHOLECYSTECTOMY N/A 10/27/2016   Procedure: LAPAROSCOPIC CHOLECYSTECTOMY;  Surgeon: Leafy Ro, MD;  Location: [facility] ORS;  Service: General;  Laterality: N/A;  . DIAGNOSTIC LAPAROSCOPY     X2  . LUNG BIOPSY  2008  . TONSILLECTOMY AND ADENOIDECTOMY      Medical History: Past Medical History:  Diagnosis Date  . Anemia    Iron Deficiency  . Asthma    in childhood  . Complication of anesthesia    HARD TO WAKE UP SOMETIIMES  . Endometriosis    dx'd in high school  . Eosinophilic pneumonia (HCC) ~08/2007   per Dr. Beverely Risen s/p VATS and 1 year of prednisone  . GERD (gastroesophageal reflux disease)    TUMS PRN  . Headache    MIGRAINES  . History of abnormal Pap smear   . History of chicken pox   . Hypothyroidism    dx'd 2011  . Pneumonia 2009   EOSINOPHILIC PNEUMONIA  . PONV (postoperative nausea and vomiting)   . Seizures (HCC)  childhood-previously on Tegretol-been off meds for years with no seizures  . Urinary tract bacterial infections     Family History: Family History  Problem Relation Age of Onset  . Mental illness Mother        bipolar  . Cancer Father        h/o throat cancer- treated  . Heart disease Maternal Grandfather        at ate >60  . Alcohol abuse Maternal Grandfather   . Stomach cancer Neg Hx   . Rectal cancer Neg Hx   . Esophageal cancer Neg Hx   . Colon cancer Neg Hx     Social History   Socioeconomic History  . Marital status: Married     Spouse name: Not on file  . Number of children: 2  . Years of education: Not on file  . Highest education level: Not on file  Occupational History  . Not on file  Social Needs  . Financial resource strain: Not on file  . Food insecurity:    Worry: Not on file    Inability: Not on file  . Transportation needs:    Medical: Not on file    Non-medical: Not on file  Tobacco Use  . Smoking status: Former Smoker    Packs/day: 0.50    Years: 10.00    Pack years: 5.00    Types: Cigarettes    Last attempt to quit: 10/21/2004    Years since quitting: 14.3  . Smokeless tobacco: Never Used  Substance and Sexual Activity  . Alcohol use: Yes    Comment: rare  . Drug use: No  . Sexual activity: Yes    Partners: Male    Birth control/protection: Condom  Lifestyle  . Physical activity:    Days per week: Not on file    Minutes per session: Not on file  . Stress: Not on file  Relationships  . Social connections:    Talks on phone: Not on file    Gets together: Not on file    Attends religious service: Not on file    Active member of club or organization: Not on file    Attends meetings of clubs or organizations: Not on file    Relationship status: Not on file  . Intimate partner violence:    Fear of current or ex partner: Not on file    Emotionally abused: Not on file    Physically abused: Not on file    Forced sexual activity: Not on file  Other Topics Concern  . Not on file  Social History Narrative   Caffeine use:  Yes   Regular exercise:  Yes   2 pregnancies = 2 live births, 1 son and 1 daughter   From [place], in [place] since 2003            Review of Systems  Constitutional: Negative for chills, fatigue and unexpected weight change.  HENT: Negative for congestion, rhinorrhea, sneezing and sore throat.   Eyes: Negative for photophobia, pain and redness.  Respiratory: Positive for cough, chest tightness and shortness of breath.   Cardiovascular: Negative for chest pain and  palpitations.  Gastrointestinal: Negative for abdominal pain, constipation, diarrhea, nausea and vomiting.  Endocrine: Negative.   Genitourinary: Negative for dysuria and frequency.  Musculoskeletal: Negative for arthralgias, back pain, joint swelling and neck pain.  Skin: Negative for rash.  Allergic/Immunologic: Negative.   Neurological: Negative for tremors and numbness.  Hematological: Negative for adenopathy. Does not bruise/bleed  easily.  Psychiatric/Behavioral: Negative for behavioral problems and sleep disturbance. The patient is not nervous/anxious.     Vital Signs: Ht 5\' 2"  (1.575 m)   Wt 187 lb (84.8 kg)   LMP 02/22/2019   BMI 34.20 kg/m    Observation/Objective:  Well appearing, speaking in full sentences.  NAD noted.    Assessment/Plan: 1. Lower respiratory infection Encouraged patient to continue her Augmentin course, and start her prednisone dose that she has on hand.  She will call or RTC sooner if she is worse or fails to improve.   2. Cough Continue to use albuterol inhaler, drink plenty of fluids, use humidifier as discussed.  Follow up in one week if symptoms fail to improve.   General Counseling: ysenia harer understanding of the findings of today's phone visit and agrees with plan of treatment. I have discussed any further diagnostic evaluation that may be needed or ordered today. We also reviewed her medications today. she has been encouraged to call the office with any questions or concerns that should arise related to todays visit.    No orders of the defined types were placed in this encounter.   No orders of the defined types were placed in this encounter.   Time spent: 12 Minutes   Blima Ledger [facility] Internal medicine

## 2019-02-28 NOTE — Patient Instructions (Signed)

## 2019-03-23 ENCOUNTER — Other Ambulatory Visit: Payer: Self-pay | Admitting: Family Medicine

## 2019-03-23 DIAGNOSIS — E039 Hypothyroidism, unspecified: Secondary | ICD-10-CM

## 2019-03-24 NOTE — Telephone Encounter (Signed)
Electronic refill request. Levothyroxine Last office visit:   02/22/2019  TSH last done March 2019 Last Filled:    98 tablet 0 12/29/2018  Please advise.

## 2019-03-25 NOTE — Telephone Encounter (Signed)
Spoke with patient. Patient already has an appointment for labs on 04/28/2019 and appointment with Dr. Para March on 05/04/2019

## 2019-03-25 NOTE — Telephone Encounter (Signed)
Sent. Thanks.  Needs TSH done this summer.  Order in EMR.

## 2019-04-04 ENCOUNTER — Ambulatory Visit: Payer: 59 | Admitting: Internal Medicine

## 2019-04-04 ENCOUNTER — Encounter: Payer: Self-pay | Admitting: Internal Medicine

## 2019-04-04 DIAGNOSIS — J82 Pulmonary eosinophilia, not elsewhere classified: Secondary | ICD-10-CM | POA: Diagnosis not present

## 2019-04-04 DIAGNOSIS — R079 Chest pain, unspecified: Secondary | ICD-10-CM | POA: Diagnosis not present

## 2019-04-04 DIAGNOSIS — R0602 Shortness of breath: Secondary | ICD-10-CM | POA: Diagnosis not present

## 2019-04-04 DIAGNOSIS — J8281 Chronic eosinophilic pneumonia: Secondary | ICD-10-CM

## 2019-04-04 MED ORDER — PREDNISONE 10 MG PO TABS: 10.0000 mg | ORAL_TABLET | Freq: Every day | ORAL | 1 refills | Status: DC

## 2019-04-04 MED ORDER — AZITHROMYCIN 250 MG PO TABS: ORAL_TABLET | ORAL | 0 refills | Status: DC

## 2019-04-04 NOTE — Progress Notes (Signed)
[facility] [address] [place], [place] [address]  Internal MEDICINE  Office Visit Note  Patient Name: [name]  [id number]  [id number]  Date of Service: 04/04/2019  Chief Complaint  Patient presents with  . Shortness of Breath  . Chest Pain  . Cough    HPI Pt was diagnosed with flu couple of months, she was treated however developed sob and chest pain, sob is getting worse, she feels like someone is sitting on her chest, she has had 2 CXR done and found out she had pneumonia, she received treatment for this, symptomatically she did not feel better, Pt does have h/o eosinophilic pneumonia in the past diagnosed VATS. She was treated with steroids for a while, symptoms resolved for last 8-10 years until now. Pt might have asthma    Current Medication: Outpatient Encounter Medications as of 04/04/2019  Medication Sig  . albuterol (PROVENTIL HFA;VENTOLIN HFA) 108 (90 Base) MCG/ACT inhaler Inhale 2 puffs into the lungs every 4 (four) hours as needed for wheezing or shortness of breath.  Marland Kitchen albuterol (PROVENTIL) (5 MG/ML) 0.5% nebulizer solution Take 2.5 mg by nebulization every 6 (six) hours as needed for wheezing or shortness of breath.  . levothyroxine (SYNTHROID) 125 MCG tablet TAKE 1 TABLET BY MOUTH ONCE DAILY EXCEPT TAKE 1 AND 1/2 TABLETS ON SUNDAYS  . amoxicillin-clavulanate (AUGMENTIN) 875-125 MG tablet Take 1 tablet by mouth 2 (two) times daily. (Patient not taking: Reported on 04/04/2019)  . azithromycin (ZITHROMAX) 250 MG tablet Take one tab po qd for 10 days for bronchitis  . predniSONE (DELTASONE) 10 MG tablet Take 1 tablet (10 mg total) by mouth daily with breakfast.  . [DISCONTINUED] predniSONE (DELTASONE) 20 MG tablet Take with food.  Take two tablets daily for 3 days followed by one tablet daily for 4 days (Patient not taking: Reported on 04/04/2019)   No facility-administered encounter medications on file as of 04/04/2019.     Surgical History: Past  Surgical History:  Procedure Laterality Date  . CESAREAN SECTION  2007  . CHOLECYSTECTOMY N/A 10/27/2016   Procedure: LAPAROSCOPIC CHOLECYSTECTOMY;  Surgeon: Leafy Ro, MD;  Location: [facility] ORS;  Service: General;  Laterality: N/A;  . DIAGNOSTIC LAPAROSCOPY     X2  . LUNG BIOPSY  2008  . TONSILLECTOMY AND ADENOIDECTOMY      Medical History: Past Medical History:  Diagnosis Date  . Anemia    Iron Deficiency  . Asthma    in childhood  . Complication of anesthesia    HARD TO WAKE UP SOMETIIMES  . Endometriosis    dx'd in high school  . Eosinophilic pneumonia (HCC) ~08/2007   per Dr. Beverely Risen s/p VATS and 1 year of prednisone  . GERD (gastroesophageal reflux disease)    TUMS PRN  . Headache    MIGRAINES  . History of abnormal Pap smear   . History of chicken pox   . Hypothyroidism    dx'd 2011  . Pneumonia 2009   EOSINOPHILIC PNEUMONIA  . PONV (postoperative nausea and vomiting)   . Seizures (HCC)    childhood-previously on Tegretol-been off meds for years with no seizures  . Urinary tract bacterial infections     Family History: Family History  Problem Relation Age of Onset  . Mental illness Mother        bipolar  . Cancer Father        h/o throat cancer- treated  . Heart disease Maternal Grandfather  at ate >60  . Alcohol abuse Maternal Grandfather   . Stomach cancer Neg Hx   . Rectal cancer Neg Hx   . Esophageal cancer Neg Hx   . Colon cancer Neg Hx     Social History   Socioeconomic History  . Marital status: Married    Spouse name: Not on file  . Number of children: 2  . Years of education: Not on file  . Highest education level: Not on file  Occupational History  . Not on file  Social Needs  . Financial resource strain: Not on file  . Food insecurity    Worry: Not on file    Inability: Not on file  . Transportation needs    Medical: Not on file    Non-medical: Not on file  Tobacco Use  . Smoking status: Former Smoker    Packs/day:  0.50    Years: 10.00    Pack years: 5.00    Types: Cigarettes    Quit date: 10/21/2004    Years since quitting: 14.4  . Smokeless tobacco: Never Used  Substance and Sexual Activity  . Alcohol use: Yes    Comment: rare  . Drug use: No  . Sexual activity: Yes    Partners: Male    Birth control/protection: Condom  Lifestyle  . Physical activity    Days per week: Not on file    Minutes per session: Not on file  . Stress: Not on file  Relationships  . Social Musician on phone: Not on file    Gets together: Not on file    Attends religious service: Not on file    Active member of club or organization: Not on file    Attends meetings of clubs or organizations: Not on file    Relationship status: Not on file  . Intimate partner violence    Fear of current or ex partner: Not on file    Emotionally abused: Not on file    Physically abused: Not on file    Forced sexual activity: Not on file  Other Topics Concern  . Not on file  Social History Narrative   Caffeine use:  Yes   Regular exercise:  Yes   2 pregnancies = 2 live births, 1 son and 1 daughter   From [place], in [place] since 2003            Review of Systems  Constitutional: Negative for chills, diaphoresis and fatigue.  HENT: Negative for ear pain, postnasal drip and sinus pressure.   Eyes: Negative for photophobia, discharge, redness, itching and visual disturbance.  Respiratory: Positive for shortness of breath and wheezing. Negative for cough.   Cardiovascular: Positive for chest pain. Negative for palpitations and leg swelling.  Gastrointestinal: Negative for abdominal pain, constipation, diarrhea, nausea and vomiting.  Genitourinary: Negative for dysuria and flank pain.  Musculoskeletal: Negative for arthralgias, back pain, gait problem and neck pain.  Skin: Negative for color change.  Allergic/Immunologic: Negative for environmental allergies and food allergies.  Neurological: Negative for dizziness and  headaches.  Hematological: Does not bruise/bleed easily.  Psychiatric/Behavioral: Negative for agitation, behavioral problems (depression) and hallucinations.    Vital Signs: BP 118/75   Pulse 80   Temp 98.4 F (36.9 C)   Resp 16   Ht 5\' 2"  (1.575 m)   Wt 196 lb (88.9 kg)   SpO2 97%   BMI 35.85 kg/m    Physical Exam Constitutional:  General: She is not in acute distress.    Appearance: She is well-developed. She is not diaphoretic.  HENT:     Head: Normocephalic and atraumatic.     Mouth/Throat:     Pharynx: No oropharyngeal exudate.  Eyes:     Pupils: Pupils are equal, round, and reactive to light.  Neck:     Thyroid: No thyromegaly.     Vascular: No JVD.     Trachea: No tracheal deviation.  Cardiovascular:     Rate and Rhythm: Normal rate and regular rhythm.     Heart sounds: Normal heart sounds. No murmur. No friction rub. No gallop.   Pulmonary:     Effort: Pulmonary effort is normal. No respiratory distress.     Breath sounds: Examination of the left-middle field reveals decreased breath sounds. Examination of the left-lower field reveals decreased breath sounds, wheezing and rhonchi. Decreased breath sounds, wheezing and rhonchi present. No rales.  Chest:     Chest wall: No tenderness.  Lymphadenopathy:     Cervical: No cervical adenopathy.  Skin:    General: Skin is warm and dry.  Neurological:     Mental Status: She is alert and oriented to person, place, and time.     Cranial Nerves: No cranial nerve deficit.  Psychiatric:        Behavior: Behavior normal.        Thought Content: Thought content normal.        Judgment: Judgment normal.    Assessment/Plan: 1. Shortness of breath - Spirometry with Graph.. Reduced FEV! - CT CHEST WO CONTRAST; Future - Pulmonary Function Test; Future  2. Chest pain, unspecified type - EKG 12-Lead..normal   3. Eosinophilic pneumonia (HCC) - Will need further diagnostics,  - azithromycin (ZITHROMAX) 250 MG  tablet; Take one tab po qd for 10 days for bronchitis  Dispense: 10 tablet; Refill: 0 - CT CHEST WO CONTRAST; Future - predniSONE (DELTASONE) 10 MG tablet; Take 1 tablet (10 mg total) by mouth daily with breakfast.  Dispense: 30 tablet; Refill: 1 - Allergy Test  General Counseling: Eriella verbalizes understanding of the findings of todays visit and agrees with plan of treatment. I have discussed any further diagnostic evaluation that may be needed or ordered today. We also reviewed her medications today. she has been encouraged to call the office with any questions or concerns that should arise related to todays visit.  Orders Placed This Encounter  Procedures  . Allergy Test  . CT CHEST WO CONTRAST  . EKG 12-Lead  . Spirometry with Graph  . Pulmonary Function Test    Meds ordered this encounter  Medications  . azithromycin (ZITHROMAX) 250 MG tablet    Sig: Take one tab po qd for 10 days for bronchitis    Dispense:  10 tablet    Refill:  0  . predniSONE (DELTASONE) 10 MG tablet    Sig: Take 1 tablet (10 mg total) by mouth daily with breakfast.    Dispense:  30 tablet    Refill:  1    Time spent:25 Minutes      Dr Lyndon Code Internal medicine

## 2019-04-06 ENCOUNTER — Ambulatory Visit: Payer: 59 | Admitting: Internal Medicine

## 2019-04-11 ENCOUNTER — Ambulatory Visit: Payer: 59

## 2019-04-12 ENCOUNTER — Ambulatory Visit: Payer: 59 | Admitting: Internal Medicine

## 2019-04-20 ENCOUNTER — Ambulatory Visit: Payer: 59 | Admitting: Internal Medicine

## 2019-04-20 DIAGNOSIS — R0600 Dyspnea, unspecified: Secondary | ICD-10-CM

## 2019-04-20 HISTORY — DX: Dyspnea, unspecified: R06.00

## 2019-04-21 ENCOUNTER — Other Ambulatory Visit: Payer: Self-pay

## 2019-04-21 ENCOUNTER — Ambulatory Visit
Admission: RE | Admit: 2019-04-21 | Discharge: 2019-04-21 | Disposition: A | Discharge status: Home / Self Care | Payer: 59 | Source: Ambulatory Visit | Attending: Internal Medicine | Admitting: Internal Medicine

## 2019-04-21 DIAGNOSIS — R0602 Shortness of breath: Secondary | ICD-10-CM

## 2019-04-21 DIAGNOSIS — J82 Pulmonary eosinophilia, not elsewhere classified: Secondary | ICD-10-CM | POA: Diagnosis present

## 2019-04-21 DIAGNOSIS — J8281 Chronic eosinophilic pneumonia: Secondary | ICD-10-CM

## 2019-04-25 NOTE — Progress Notes (Signed)
Can u call her to see how is she feeling, which dose of prednisone and abx if any she takes, keep her follow up on 7/9

## 2019-04-26 ENCOUNTER — Other Ambulatory Visit: Payer: Self-pay | Admitting: Family Medicine

## 2019-04-26 ENCOUNTER — Telehealth: Payer: Self-pay

## 2019-04-26 DIAGNOSIS — J8281 Chronic eosinophilic pneumonia: Secondary | ICD-10-CM

## 2019-04-26 DIAGNOSIS — Z131 Encounter for screening for diabetes mellitus: Secondary | ICD-10-CM

## 2019-04-26 DIAGNOSIS — Z13220 Encounter for screening for lipoid disorders: Secondary | ICD-10-CM

## 2019-04-26 DIAGNOSIS — E039 Hypothyroidism, unspecified: Secondary | ICD-10-CM

## 2019-04-26 DIAGNOSIS — D649 Anemia, unspecified: Secondary | ICD-10-CM

## 2019-04-26 NOTE — Telephone Encounter (Signed)
Left detailed VM w COVID screen and back door lab info   

## 2019-04-27 ENCOUNTER — Telehealth: Payer: Self-pay

## 2019-04-28 ENCOUNTER — Encounter: Payer: Self-pay | Admitting: Internal Medicine

## 2019-04-28 ENCOUNTER — Ambulatory Visit: Payer: 59 | Admitting: Internal Medicine

## 2019-04-28 ENCOUNTER — Other Ambulatory Visit: Payer: Self-pay

## 2019-04-28 ENCOUNTER — Other Ambulatory Visit (INDEPENDENT_AMBULATORY_CARE_PROVIDER_SITE_OTHER): Payer: 59

## 2019-04-28 VITALS — BP 116/80 | HR 88 | Resp 16 | Ht 62.5 in | Wt 196.0 lb

## 2019-04-28 DIAGNOSIS — J84112 Idiopathic pulmonary fibrosis: Secondary | ICD-10-CM | POA: Diagnosis not present

## 2019-04-28 DIAGNOSIS — J301 Allergic rhinitis due to pollen: Secondary | ICD-10-CM

## 2019-04-28 DIAGNOSIS — R05 Cough: Secondary | ICD-10-CM | POA: Diagnosis not present

## 2019-04-28 DIAGNOSIS — D649 Anemia, unspecified: Secondary | ICD-10-CM | POA: Diagnosis not present

## 2019-04-28 DIAGNOSIS — E039 Hypothyroidism, unspecified: Secondary | ICD-10-CM

## 2019-04-28 DIAGNOSIS — J8281 Chronic eosinophilic pneumonia: Secondary | ICD-10-CM

## 2019-04-28 DIAGNOSIS — Z13220 Encounter for screening for lipoid disorders: Secondary | ICD-10-CM

## 2019-04-28 DIAGNOSIS — R0602 Shortness of breath: Secondary | ICD-10-CM

## 2019-04-28 DIAGNOSIS — R059 Cough, unspecified: Secondary | ICD-10-CM

## 2019-04-28 DIAGNOSIS — J82 Pulmonary eosinophilia, not elsewhere classified: Secondary | ICD-10-CM | POA: Diagnosis not present

## 2019-04-28 DIAGNOSIS — Z131 Encounter for screening for diabetes mellitus: Secondary | ICD-10-CM | POA: Diagnosis not present

## 2019-04-28 LAB — LIPID PANEL
Cholesterol: 148 mg/dL (ref 0–200)
HDL: 42.5 mg/dL (ref >39.00)
LDL Cholesterol: 76 mg/dL (ref 0–99)
NonHDL: 105.83
Total CHOL/HDL Ratio: 3
Triglycerides: 149 mg/dL (ref 0.0–149.0)
VLDL: 29.8 mg/dL (ref 0.0–40.0)

## 2019-04-28 LAB — CBC WITH DIFFERENTIAL/PLATELET
Basophils Absolute: 0 10*3/uL (ref 0.0–0.1)
Basophils Relative: 0.4 % (ref 0.0–3.0)
Eosinophils Absolute: 0.3 10*3/uL (ref 0.0–0.7)
Eosinophils Relative: 4.3 % (ref 0.0–5.0)
HCT: 37.6 % (ref 36.0–46.0)
Hemoglobin: 12.4 g/dL (ref 12.0–15.0)
Lymphocytes Relative: 17.1 % (ref 12.0–46.0)
Lymphs Abs: 1.3 10*3/uL (ref 0.7–4.0)
MCHC: 33 g/dL (ref 30.0–36.0)
MCV: 83.3 fl (ref 78.0–100.0)
Monocytes Absolute: 0.5 10*3/uL (ref 0.1–1.0)
Monocytes Relative: 7.3 % (ref 3.0–12.0)
Neutro Abs: 5.2 10*3/uL (ref 1.4–7.7)
Neutrophils Relative %: 70.9 % (ref 43.0–77.0)
Platelets: 276 10*3/uL (ref 150.0–400.0)
RBC: 4.52 Mil/uL (ref 3.87–5.11)
RDW: 15 % (ref 11.5–15.5)
WBC: 7.3 10*3/uL (ref 4.0–10.5)

## 2019-04-28 LAB — BASIC METABOLIC PANEL
BUN: 8 mg/dL (ref 6–23)
CO2: 28 mEq/L (ref 19–32)
Calcium: 9.2 mg/dL (ref 8.4–10.5)
Chloride: 104 mEq/L (ref 96–112)
Creatinine, Ser: 0.73 mg/dL (ref 0.40–1.20)
GFR: 90.17 mL/min (ref >60.00)
Glucose, Bld: 96 mg/dL (ref 70–99)
Potassium: 4.3 mEq/L (ref 3.5–5.1)
Sodium: 138 mEq/L (ref 135–145)

## 2019-04-28 LAB — TSH: TSH: 4.56 u[IU]/mL — ABNORMAL HIGH (ref 0.35–4.50)

## 2019-04-28 NOTE — Telephone Encounter (Signed)
FYI

## 2019-04-28 NOTE — Progress Notes (Signed)
[facility] [address] 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  She now states that she has been having some cough and shortness of breath she states that she felt the symptoms were somewhat more concerning and so therefore she came in to see me for consultation.  She had a CT scan done already which does show some groundglass opacities in the lower portions more pronounced on the right side along with this she does have some traction bronchiectasis.  There is no evidence of cystic changes or honeycombing.  The pattern is 1 of 5 usual interstitial pneumonia.  She has not been tested for COVID and she does not admit to any fever or chills but she does have shortness of breath and she does have cough.  Cough is mostly dry and she denies having any hemoptysis  ROS  General: (-) fever, (-) chills, (-) night sweats, (-) weakness Skin: (-) rashes, (-) itching,. Eyes: (-) visual changes, (-) redness, (-) itching. Nose and Sinuses: (-) nasal stuffiness or itchiness, (-) postnasal drip, (-) nosebleeds, (-) sinus trouble. Mouth and Throat: (-) sore throat, (-) hoarseness. Neck: (-) swollen glands, (-) enlarged thyroid, (-) neck pain. Respiratory: + cough, (-) bloody sputum, + shortness of breath, - wheezing. Cardiovascular: - ankle swelling, (-) chest pain. Lymphatic: (-) lymph node enlargement. Neurologic: (-) numbness, (-) tingling. Psychiatric: (-) anxiety, (-) depression   Current Medication: Outpatient Encounter Medications as of 04/28/2019  Medication Sig  . albuterol  (PROVENTIL HFA;VENTOLIN HFA) 108 (90 Base) MCG/ACT inhaler Inhale 2 puffs into the lungs every 4 (four) hours as needed for wheezing or shortness of breath.  Marland Kitchen albuterol (PROVENTIL) (5 MG/ML) 0.5% nebulizer solution Take 2.5 mg by nebulization every 6 (six) hours as needed for wheezing or shortness of breath.  . levothyroxine (SYNTHROID) 125 MCG tablet TAKE 1 TABLET BY MOUTH ONCE DAILY EXCEPT TAKE 1 AND 1/2 TABLETS ON SUNDAYS  . [DISCONTINUED] amoxicillin-clavulanate (AUGMENTIN) 875-125 MG tablet Take 1 tablet by mouth 2 (two) times daily. (Patient not taking: Reported on 04/04/2019)  . [DISCONTINUED] azithromycin (ZITHROMAX) 250 MG tablet Take one tab po qd for 10 days for bronchitis (Patient not taking: Reported on 04/28/2019)  . [DISCONTINUED] predniSONE (DELTASONE) 10 MG tablet Take 1 tablet (10 mg total) by mouth daily with breakfast. (Patient not taking: Reported on 04/28/2019)   No facility-administered encounter medications on file as of 04/28/2019.     Surgical History: Past Surgical History:  Procedure Laterality Date  . CESAREAN SECTION  2007  . CHOLECYSTECTOMY N/A 10/27/2016   Procedure: LAPAROSCOPIC CHOLECYSTECTOMY;  Surgeon: Leafy Ro, MD;  Location: [facility] ORS;  Service: General;  Laterality: N/A;  . DIAGNOSTIC LAPAROSCOPY     X2  . LUNG BIOPSY  2008  . TONSILLECTOMY AND ADENOIDECTOMY      Medical History: Past Medical History:  Diagnosis Date  . Anemia    Iron Deficiency  . Asthma    in childhood  . Complication of anesthesia    HARD TO WAKE UP SOMETIIMES  . Endometriosis    dx'd in high school  . Eosinophilic pneumonia (HCC) ~  08/2007   per Dr. Beverely Risen s/p VATS and 1 year of prednisone  . GERD (gastroesophageal reflux disease)    TUMS PRN  . Headache    MIGRAINES  . History of abnormal Pap smear   . History of chicken pox   . Hypothyroidism    dx'd 2011  . Pneumonia 2009   EOSINOPHILIC PNEUMONIA  . PONV (postoperative nausea and vomiting)   . Seizures (HCC)     childhood-previously on Tegretol-been off meds for years with no seizures  . Urinary tract bacterial infections     Family History: Family History  Problem Relation Age of Onset  . Mental illness Mother        bipolar  . Cancer Father        h/o throat cancer- treated  . Heart disease Maternal Grandfather        at ate >60  . Alcohol abuse Maternal Grandfather   . Stomach cancer Neg Hx   . Rectal cancer Neg Hx   . Esophageal cancer Neg Hx   . Colon cancer Neg Hx     Social History: Social History   Socioeconomic History  . Marital status: Married    Spouse name: Not on file  . Number of children: 2  . Years of education: Not on file  . Highest education level: Not on file  Occupational History  . Not on file  Social Needs  . Financial resource strain: Not on file  . Food insecurity    Worry: Not on file    Inability: Not on file  . Transportation needs    Medical: Not on file    Non-medical: Not on file  Tobacco Use  . Smoking status: Former Smoker    Packs/day: 0.50    Years: 10.00    Pack years: 5.00    Types: Cigarettes    Quit date: 10/21/2004    Years since quitting: 14.5  . Smokeless tobacco: Never Used  Substance and Sexual Activity  . Alcohol use: Yes    Comment: rare  . Drug use: No  . Sexual activity: Yes    Partners: Male    Birth control/protection: Condom  Lifestyle  . Physical activity    Days per week: Not on file    Minutes per session: Not on file  . Stress: Not on file  Relationships  . Social Musician on phone: Not on file    Gets together: Not on file    Attends religious service: Not on file    Active member of club or organization: Not on file    Attends meetings of clubs or organizations: Not on file    Relationship status: Not on file  . Intimate partner violence    Fear of current or ex partner: Not on file    Emotionally abused: Not on file    Physically abused: Not on file    Forced sexual activity: Not on  file  Other Topics Concern  . Not on file  Social History Narrative   Caffeine use:  Yes   Regular exercise:  Yes   2 pregnancies = 2 live births, 1 son and 1 daughter   From [place], in [place] since 2003          Vital Signs: Blood pressure 116/80, pulse 88, resp. rate 16, height 5' 2.5" (1.588 m), weight 196 lb (88.9 kg), SpO2 99 %.  Examination: General Appearance: The patient is well-developed, well-nourished, and in  no distress. Skin: Gross inspection of skin unremarkable. Head: normocephalic, no gross deformities. Eyes: no gross deformities noted. ENT: ears appear grossly normal no exudates. Neck: Supple. No thyromegaly. No LAD. Respiratory: no rhonchi noted at this time. Cardiovascular: Normal S1 and S2 without murmur or rub. Extremities: No cyanosis. pulses are equal. Neurologic: Alert and oriented. No involuntary movements.  LABS: Recent Results (from the past 2160 hour(s))  CBC with Differential/Platelet     Status: None   Collection Time: 02/22/19 12:06 PM  Result Value Ref Range   WBC 4.6 4.0 - 10.5 K/uL   RBC 4.38 3.87 - 5.11 Mil/uL   Hemoglobin 12.3 12.0 - 15.0 g/dL   HCT 40.9 81.1 - 91.4 %   MCV 84.4 78.0 - 100.0 fl   MCHC 33.3 30.0 - 36.0 g/dL   RDW 78.2 95.6 - 21.3 %   Platelets 249.0 150.0 - 400.0 K/uL   Neutrophils Relative % 58.4 43.0 - 77.0 %   Lymphocytes Relative 27.3 12.0 - 46.0 %   Monocytes Relative 9.5 3.0 - 12.0 %   Eosinophils Relative 4.3 0.0 - 5.0 %   Basophils Relative 0.5 0.0 - 3.0 %   Neutro Abs 2.7 1.4 - 7.7 K/uL   Lymphs Abs 1.3 0.7 - 4.0 K/uL   Monocytes Absolute 0.4 0.1 - 1.0 K/uL   Eosinophils Absolute 0.2 0.0 - 0.7 K/uL   Basophils Absolute 0.0 0.0 - 0.1 K/uL  Basic metabolic panel     Status: Abnormal   Collection Time: 02/22/19 12:06 PM  Result Value Ref Range   Sodium 138 135 - 145 mEq/L   Potassium 4.0 3.5 - 5.1 mEq/L   Chloride 104 96 - 112 mEq/L   CO2 29 19 - 32 mEq/L   Glucose, Bld 80 70 - 99 mg/dL   BUN 5 (L) 6 - 23  mg/dL   Creatinine, Ser 0.86 0.40 - 1.20 mg/dL   Calcium 9.0 8.4 - 57.8 mg/dL   GFR 46.96 >29.52 mL/min    Radiology: Ct Chest Wo Contrast  Result Date: 04/21/2019 CLINICAL DATA:  Chronic eosinophilic pneumonia with history partial right lung resection in 2008. Persistent dyspnea. Treated with antibiotic therapy recently for pneumonia. EXAM: CT CHEST WITHOUT CONTRAST TECHNIQUE: Multidetector CT imaging of the chest was performed following the standard protocol without IV contrast. COMPARISON:  09/27/2012 chest CT. FINDINGS: Cardiovascular: Normal heart size. Stable trace pericardial effusion/thickening. Great vessels are normal in course and caliber. Mediastinum/Nodes: No discrete thyroid nodules. Unremarkable esophagus. No pathologically enlarged axillary, mediastinal or hilar lymph nodes, noting limited sensitivity for the detection of hilar adenopathy on this noncontrast study. Lungs/Pleura: No pneumothorax. No pleural effusion. No acute consolidative airspace disease, lung masses or significant pulmonary nodules. Wedge resection suture lines again noted at the periphery of right upper, right middle and right lower lobes. There is patchy confluent peribronchovascular and subpleural ground-glass opacity and reticulation in both lungs with a strong basilar predominance, with associated moderate traction bronchiectasis, architectural distortion and volume loss. Findings have clearly progressed since 2013 chest CT. No frank honeycombing. Upper abdomen: Cholecystectomy. Musculoskeletal:  No aggressive appearing focal osseous lesions. IMPRESSION: Spectrum of findings compatible with basilar predominant fibrotic interstitial lung disease with moderate traction bronchiectasis and no honeycombing. Findings have clearly progressed since 2013 chest CT. The imaging pattern is most compatible with usual interstitial pneumonia (UIP). The basilar predominance is atypical for chronic eosinophilic pneumonia.  Electronically Signed   By: Delbert Phenix M.D.   On: 04/21/2019 17:31  No results found.  Ct Chest Wo Contrast  Result Date: 04/21/2019 CLINICAL DATA:  Chronic eosinophilic pneumonia with history partial right lung resection in 2008. Persistent dyspnea. Treated with antibiotic therapy recently for pneumonia. EXAM: CT CHEST WITHOUT CONTRAST TECHNIQUE: Multidetector CT imaging of the chest was performed following the standard protocol without IV contrast. COMPARISON:  09/27/2012 chest CT. FINDINGS: Cardiovascular: Normal heart size. Stable trace pericardial effusion/thickening. Great vessels are normal in course and caliber. Mediastinum/Nodes: No discrete thyroid nodules. Unremarkable esophagus. No pathologically enlarged axillary, mediastinal or hilar lymph nodes, noting limited sensitivity for the detection of hilar adenopathy on this noncontrast study. Lungs/Pleura: No pneumothorax. No pleural effusion. No acute consolidative airspace disease, lung masses or significant pulmonary nodules. Wedge resection suture lines again noted at the periphery of right upper, right middle and right lower lobes. There is patchy confluent peribronchovascular and subpleural ground-glass opacity and reticulation in both lungs with a strong basilar predominance, with associated moderate traction bronchiectasis, architectural distortion and volume loss. Findings have clearly progressed since 2013 chest CT. No frank honeycombing. Upper abdomen: Cholecystectomy. Musculoskeletal:  No aggressive appearing focal osseous lesions. IMPRESSION: Spectrum of findings compatible with basilar predominant fibrotic interstitial lung disease with moderate traction bronchiectasis and no honeycombing. Findings have clearly progressed since 2013 chest CT. The imaging pattern is most compatible with usual interstitial pneumonia (UIP). The basilar predominance is atypical for chronic eosinophilic pneumonia. Electronically Signed   By: Delbert Phenix  M.D.   On: 04/21/2019 17:31      Assessment and Plan: Patient Active Problem List   Diagnosis Date Noted  . Lower respiratory infection 01/26/2019  . Lumbar radicular pain 12/21/2017  . Advance care planning 05/04/2017  . Right sided abdominal pain 12/03/2016  . Calculus of gallbladder without cholecystitis without obstruction 08/05/2016  . Anemia 07/31/2016  . Abdominal bloating 07/30/2016  . Migraine without aura 04/18/2012  . Cough 02/06/2012  . Eosinophilic pneumonia (HCC) 02/07/2011  . Hypothyroidism 02/07/2011    1. Cough increasing in symptoms of late will probably need to go back on steroids based on the CT findings the only concern I have if there is any underlying infectious process she did notice that she does seem to get better when she was put on azithromycin however symptoms come back after she comes off of the azithromycin. 2. UIP noticed on the CT scan of the chest the patient does have a history of eosinophilic pneumonia the good prognostic indicator is that there are no cystic changes and there is no honeycombing noted.  More than likely she will need to go back on prednisone however prior to that I recommended that she have a bronchoscopy to rule out any infectious etiology. 3. Eosinophilic pneumonia prior diagnosis certainly could be a recurrence will continue with the plan as already formulated above 4. SOB pulmonary functions have been scheduled  General Counseling: I have discussed the findings of the evaluation and examination with Victorino Dike.  I have also discussed any further diagnostic evaluation thatmay be needed or ordered today. Prescilla verbalizes understanding of the findings of todays visit. We also reviewed her medications today and discussed drug interactions and side effects including but not limited excessive drowsiness and altered mental states. We also discussed that there is always a risk not just to her but also people around her. she has been  encouraged to call the office with any questions or concerns that should arise related to todays visit.    Time spent: 25  I have personally  obtained a history, examined the patient, evaluated laboratory and imaging results, formulated the assessment and plan and placed orders.    Yevonne Pax, MD [facility] Pulmonary and Critical Care Sleep medicine

## 2019-04-28 NOTE — Procedures (Signed)
    [facility] Allergy MQT Recording Form  [facility] [address] [place], [place] [address] Phone [phone] Fax [phone]   Patient Name: [name] Age: 38 y.o. Sex: female Date of Service: 04/28/2019   Performing Provider: Allyne Gee MD [facility]         Battery A Back   Site Antigen [facility] Flare  A1 Positive Control 5 14  A2 Negative Control 4 4  A3 American Elm 0 0  A4 Maple Box Elder 0 0  A5 Grass Mix 0 0  A6 Dock Sorrel Mix 0 0  A7 Russian Thistle 0 0  A8 Ragweed 0 0  A9 English Pantain 0 0  A10 Oak Mix  0 0   Battery B Wheal Flare  B1 Lambs Quarters 0 0  B2 Cottonwood 0 0  B3 Pigweed Mix 0 0  B4 Acacia 0 0  B5 Pine Mix 0 0  B6 Privet 0 0  B7 White/Red Mulberry 0 0  B8 Western Water Hemp 0 0  B9 Guatemala Grass 0 0  B10 Melalucea 0 0   Battery C Wheal Flare  C1 Red River Birch 4 6  C2 Eastern Sycamore 4 5  C3 Bahai Grass 4 6  C4 American Beech 4 6  C5 Ash Mix 0 0  C6 Black Willow 0 0  C7 Hickory 0 0  C8 Black Walnut 0 0  C9 Red Cedar 0 0  C10 Sweet Gum      Battery D Wheal Flare  D1 Cultivated Oat 0 0  D2 Dog Fennel 5 6  D3 Common Mugwort 0 0  D4 Marsh Elder 0 0  D5 Johnson 0 0  D6 Hackberry Tree 0 0  D7 Bayberry Tree 0 0  D8 Cypress, Bald Tree 0 0  D9 Aspergillus Fumigatus 0 0  D10 Alternia      Battery E Wheal Flare  E1 Dreschlere 0 0  e2 Fusarium Mix 0 0  E3 Cladosporum Sph 0 0  E4 Bipolaris 0 0  E5 Penicillin chrys 0 0  E6 Cladosporum Herb 0 0  E7 Candida 0 0  E8 Aureobasidium 0 0  E9 Rhizopus 0 0  E10 Botrytis  0 0   Battery F Wheal Flare  F1 Aspergillus [place] 0 0  F2 Dust Mite Mix 0 0  F3 Cockroach Mix 0 0  F4 Cat Hair 0 0  F5 Dog Mixed breeds 0 0  F6 Feather Mix 0 0

## 2019-04-29 ENCOUNTER — Telehealth: Payer: Self-pay

## 2019-04-29 ENCOUNTER — Other Ambulatory Visit
Admission: RE | Admit: 2019-04-29 | Discharge: 2019-04-29 | Disposition: A | Discharge status: Home / Self Care | Payer: 59 | Source: Ambulatory Visit | Attending: Cardiovascular Disease | Admitting: Cardiovascular Disease

## 2019-04-29 ENCOUNTER — Other Ambulatory Visit: Payer: 59

## 2019-04-29 ENCOUNTER — Telehealth: Payer: Self-pay | Admitting: *Deleted

## 2019-04-29 DIAGNOSIS — Z01812 Encounter for preprocedural laboratory examination: Secondary | ICD-10-CM | POA: Diagnosis present

## 2019-04-29 DIAGNOSIS — Z1159 Encounter for screening for other viral diseases: Secondary | ICD-10-CM | POA: Diagnosis not present

## 2019-04-29 LAB — SARS CORONAVIRUS 2 (TAT 6-24 HRS): SARS Coronavirus 2: NEGATIVE

## 2019-04-29 NOTE — Telephone Encounter (Signed)
Spoke with ashley from pre post admission [phone] to schedule pt appt today for covid 19 test by medical art [address]  Before 2;30 and called pt and advised she can go now for testing  And gave her address and phone no

## 2019-04-29 NOTE — Telephone Encounter (Signed)
-----   Message from Lenon Oms, [place] sent at 04/28/2019  5:02 PM EDT ----- Please contact patient for covid testing ) [phone] (H)

## 2019-04-29 NOTE — Telephone Encounter (Signed)
Spoke with patient.  She states she is scheduled for bronchoscopy on 05/03/2019 and needs results of COVID 19 test before then.  Advised patient I will forward message back to her provider to order through the Pre-procedure/admission test site because they have a faster turn around time for receiving results.  Community test sites are take 5-7 days for results.  Please contact [phone] to schedule for [facility] pre procedure tests.

## 2019-05-02 ENCOUNTER — Other Ambulatory Visit: Payer: Self-pay

## 2019-05-02 ENCOUNTER — Ambulatory Visit: Payer: 59 | Admitting: Internal Medicine

## 2019-05-02 ENCOUNTER — Other Ambulatory Visit
Admission: RE | Admit: 2019-05-02 | Discharge: 2019-05-02 | Disposition: A | Discharge status: Home / Self Care | Payer: 59 | Source: Ambulatory Visit | Attending: Pulmonary Disease | Admitting: Pulmonary Disease

## 2019-05-02 HISTORY — DX: Carcinoma in situ of skin, unspecified: D04.9

## 2019-05-02 NOTE — Patient Instructions (Signed)
INSTRUCTIONS FOR SURGERY     Your surgery is scheduled for:  Tuesday, July 14TH     To find out your arrival time for the day of surgery,          please call [phone] between 1 pm and 3 pm on :  TODAY, July 13TH     When you arrive for surgery, report to the SECOND FLOOR OF THE MEDICAL MALL.       Do NOT stop on the first floor to register.    REMEMBER: Instructions that are not followed completely may result in serious medical risk,  up to and including death, or upon the discretion of your surgeon and anesthesiologist,            your surgery may need to be rescheduled.  __X__ 1. Do not eat food after midnight the night before your procedure.                    No gum, candy, lozenger, tic tacs, tums or hard candies.                  ABSOLUTELY NOTHING SOLID IN YOUR MOUTH AFTER MIDNIGHT                    You may drink unlimited clear liquids up to 2 hours before you are scheduled to arrive for surgery.                   Do not drink anything within those 2 hours unless you need to take medicine, then take the                   smallest amount you need.  Clear liquids include:  water, apple juice without pulp,                   any flavor Gatorade, Black coffee, black tea.  Sugar may be added but no dairy/ honey /lemon.                        Broth and jello is not considered a clear liquid.  __x__  2. On the morning of surgery, please brush your teeth with toothpaste and water. You may rinse with                  mouthwash if you wish but DO NOT SWALLOW TOOTHPASTE OR MOUTHWASH  __X___3. NO alcohol for 24 hours before or after surgery.  __x___ 4.  Do NOT smoke or use e-cigarettes for 24 HOURS PRIOR TO SURGERY.                      DO NOT Use any chewable tobacco products for at least 6 hours prior to surgery.  __x___ 5. If you start any new medication after this appointment and prior to surgery, please  Bring it with you on the day of surgery.  ___x__ 6. Notify your doctor if there is any change in your medical condition, such as fever, infection, vomitting,  Diarrhea or any open sores.  __x___ 7.  USE the CHG SOAP as instructed, the night before surgery and the day of surgery.                   Once you have washed with this soap, do NOT use any of the following: Powders, perfumes                    or lotions. Please do not wear make up, hairpins, clips or nail polish. You MAY wear deodorant.                   Men may shave their face and neck.  Women need to shave 48 hours prior to surgery.                   DO NOT wear ANY jewelry on the day of surgery. If there are rings that are too tight to                    remove easily, please address this prior to the surgery day. Piercings need to be removed.                                                                     NO METAL ON YOUR BODY.                    Do NOT bring any valuables.  If you came to Pre-Admit testing then you will not need license,                     insurance card or credit card.  If you will be staying overnight, please either leave your things in                     the car or have your family be responsible for these items.                     [facility] IS NOT RESPONSIBLE FOR BELONGINGS OR VALUABLES.  ___X__ 8. DO NOT wear contact lenses on surgery day.  You may not have dentures,                     Hearing aides, contacts or glasses in the operating room. These items can be                    Placed in the Recovery Room to receive immediately after surgery.  __x___ 9. IF YOU ARE SCHEDULED TO GO HOME ON THE SAME DAY, YOU MUST                   Have someone to drive you home and to stay with you  for the first 24 hours.                    Have an arrangement prior to arriving on surgery day.  ___x__ 10. Take the following medications on the morning of surgery with a sip of water:  1.  ALBUTEROL INHALER                     2.  SYNTHROID                     3.                   _____ 11.  Follow any instructions provided to you by your surgeon.                        Such as enema, clear liquid bowel prep  __X__  12. STOP COUMADIN / PLAVIX / ELIQUIS / ASPIRIN AS OF:  TODAY                       THIS INCLUDES BC POWDERS / GOODIES POWDER  __x___ 13. STOP Anti-inflammatories as of:   TODAY                      This includes IBUPROFEN / MOTRIN / ADVIL / ALEVE/ NAPROXYN                    YOU MAY TAKE TYLENOL ANY TIME PRIOR TO SURGERY.  ___X__ 14.  Stop supplements until after surgery.                     This includes:   N/A                 You may continue taking Vitamin B12 / Vitamin D3 but do not take on the morning of surgery.  ___X___15.  Please have appropriate shoes to wear to be able to walk around the unit.                   Wear clean and comfortable clothing to the hospital.

## 2019-05-03 ENCOUNTER — Ambulatory Visit: Payer: 59

## 2019-05-03 ENCOUNTER — Encounter: Payer: Self-pay | Admitting: *Deleted

## 2019-05-03 ENCOUNTER — Encounter
Admission: RE | Disposition: A | Discharge status: Home / Self Care | Payer: Self-pay | Source: Home / Self Care | Attending: Internal Medicine

## 2019-05-03 ENCOUNTER — Ambulatory Visit
Admission: RE | Admit: 2019-05-03 | Discharge: 2019-05-03 | Disposition: A | Discharge status: Home / Self Care | Payer: 59 | Attending: Internal Medicine | Admitting: Internal Medicine

## 2019-05-03 DIAGNOSIS — Z79890 Hormone replacement therapy (postmenopausal): Secondary | ICD-10-CM | POA: Insufficient documentation

## 2019-05-03 DIAGNOSIS — R0602 Shortness of breath: Secondary | ICD-10-CM | POA: Insufficient documentation

## 2019-05-03 DIAGNOSIS — R05 Cough: Secondary | ICD-10-CM | POA: Insufficient documentation

## 2019-05-03 DIAGNOSIS — J82 Pulmonary eosinophilia, not elsewhere classified: Secondary | ICD-10-CM | POA: Diagnosis present

## 2019-05-03 DIAGNOSIS — Z87891 Personal history of nicotine dependence: Secondary | ICD-10-CM | POA: Diagnosis not present

## 2019-05-03 DIAGNOSIS — Z9889 Other specified postprocedural states: Secondary | ICD-10-CM

## 2019-05-03 DIAGNOSIS — Z8701 Personal history of pneumonia (recurrent): Secondary | ICD-10-CM | POA: Diagnosis not present

## 2019-05-03 HISTORY — PX: FLEXIBLE BRONCHOSCOPY: SHX5094

## 2019-05-03 LAB — POCT PREGNANCY, URINE: Preg Test, Ur: NEGATIVE

## 2019-05-03 SURGERY — BRONCHOSCOPY, FLEXIBLE
Anesthesia: Monitor Anesthesia Care

## 2019-05-03 MED ORDER — ALBUTEROL SULFATE (2.5 MG/3ML) 0.083% IN NEBU
INHALATION_SOLUTION | RESPIRATORY_TRACT | Status: AC
  Administered 2019-05-03: 2.5 mg
  Filled 2019-05-03: qty 3

## 2019-05-03 MED ORDER — LACTATED RINGERS IV SOLN
INTRAVENOUS | Status: DC
  Administered 2019-05-03: 13:00:00 via INTRAVENOUS

## 2019-05-03 MED ORDER — FENTANYL CITRATE (PF) 100 MCG/2ML IJ SOLN
INTRAMUSCULAR | Status: DC | PRN
  Administered 2019-05-03 (×3): 50 ug via INTRAVENOUS

## 2019-05-03 MED ORDER — MIDAZOLAM HCL 2 MG/2ML IJ SOLN
INTRAMUSCULAR | Status: AC
  Filled 2019-05-03: qty 8

## 2019-05-03 MED ORDER — FAMOTIDINE 20 MG PO TABS: 20.0000 mg | ORAL_TABLET | Freq: Once | ORAL | Status: DC

## 2019-05-03 MED ORDER — IPRATROPIUM-ALBUTEROL 0.5-2.5 (3) MG/3ML IN SOLN: 3.0000 mL | Freq: Once | RESPIRATORY_TRACT | Status: DC

## 2019-05-03 MED ORDER — MIDAZOLAM HCL 2 MG/2ML IJ SOLN
INTRAMUSCULAR | Status: DC | PRN
  Administered 2019-05-03 (×2): 1 mg via INTRAVENOUS
  Administered 2019-05-03: 2 mg via INTRAVENOUS

## 2019-05-03 MED ORDER — FENTANYL CITRATE (PF) 100 MCG/2ML IJ SOLN
INTRAMUSCULAR | Status: AC
  Filled 2019-05-03: qty 8

## 2019-05-03 NOTE — Procedures (Signed)
Date: 05/03/2019,  MRN# [id number]    Procedure Note: Fiberoptic Bronchoscopy   PROCEDURE DATE: 05/03/2019     NAME:  [name]   DOB:1980/11/19   MRN: [id number] LOC:  ARPO/None      Indications/Preliminary Diagnosis: Eosinophilic pneumonia  Consent: (Place X beside choice/s below)  The benefits, risks and possible complications of the procedure were        explained to:  __X_ patient  ___ patient's family  ___ other:___________  who verbalized understanding and gave:  ___ verbal  _X__ written  ___ verbal and written  ___ telephone  ___ other:________ consent.      Unable to obtain consent; procedure performed on emergent basis.     Other:      PRESEDATION ASSESSMENT: History and Physical has been performed. Patient meds and allergies have been reviewed. Presedation airway examination has been performed and documented. Baseline vital signs, sedation score, oxygenation status, and cardiac rhythm were reviewed. Patient was deemed to be in satisfactory condition to undergo the procedure.  PREMEDICATIONS:   Sedative/Narcotic Amt Dose   Versed 2 mg   Fentanyl 50 mcg  Diprivan  mg         Insertion Route (Place X beside choice below)  X Nasal   Oral   Endotracheal Tube   Tracheostomy   INTRAPROCEDURE MEDICATIONS:  Sedative/Narcotic Amt Dose   Versed 2 mg   Fentanyl 100 mcg  Diprivan  mg       Medication Amt Dose  Medication Amt Dose  Xylocaine 2% 20 cc  Epinephrine 1:10,000 sol  cc  Xylocaine 4%  cc  Cocaine  cc   TECHNICAL PROCEDURES: (Place X beside choice below)   Procedures  Description  x  None     Electrocautery     Cryotherapy     Balloon Dilatation     Bronchography     Stent Placement     Therapeutic Aspiration     Laser/Argon Plasma            SPECIMENS (Sites): (Place X beside choice below)  Specimens Description   No Specimens Obtained     Washings   x Lavage   x Biopsies    Fine Needle Aspirates    Brushings    Sputum     FINDINGS:  ESTIMATED BLOOD LOSS: none  COMPLICATIONS/RESOLUTION: none  PROCEDURE DETAILS: Timeout performed and correct patient, name, & ID confirmed. Following prep per Pulmonary policy, appropriate sedation was administered.  Airway exam proceeded with findings, technical procedures, and specimen collection as noted below. At the end of exam the scope was withdrawn without incident. Impression and Plan as noted below.    Procedure Note: After informed consent was obtained patient was brought to the bronchoscopy suite and prepared in the usual manner.  The patient was given Versed and fentanyl for PREProcedure sedation.  Once adequate sedation was achieved the fiberoptic scope was inserted down through the right nostril to the vocal cords.  The vocal cords were anesthetized with lidocaine and then now passed through the vocal cords without any difficulty.  The carina was found to be nice and sharp.  The right lung was evaluated and found to be free of any major secretions.  The underlying mucosa was erythematous.  Scope was wedged into the right middle lobe BAL was performed scope was wedged into the right lower lobe BAL was performed.  Also transbronchial biopsies were performed from the right lower lobe as well as the right middle  lobe.  This was done under fluoroscopic guidance.  Total of 30 seconds of fluoroscopy time was used.  Patient tolerated the procedure well there was no significant bleeding noted post procedure chest x-ray was ordered    IMPRESSION:POST-PROCEDURE DX: Eosinophilic pneumonia   RECOMMENDATION/PLAN: Await results of the BAL cultures and pathology  I have personally performed the procedure as noted above     Allyne Gee, MD [facility] Pulmonary Critical Care Medicine

## 2019-05-03 NOTE — Discharge Instructions (Signed)
AMBULATORY SURGERY  DISCHARGE INSTRUCTIONS   1) The drugs that you were given will stay in your system until tomorrow so for the next 24 hours you should not:  A) Drive an automobile B) Make any legal decisions C) Drink any alcoholic beverage   2) You may resume regular meals tomorrow.  Today it is better to start with liquids and gradually work up to solid foods.  You may eat anything you prefer, but it is better to start with liquids, then soup and crackers, and gradually work up to solid foods.   3) Please notify your doctor immediately if you have any unusual bleeding, trouble breathing, redness and pain at the surgery site, drainage, fever, or pain not relieved by medication.    4) Additional Instructions: Dr Trish Mage office will call you with results        Please contact your physician with any problems or Same Day Surgery at [phone], Monday through Friday 6 am to 4 pm, or [facility] at [facility] number at [phone].

## 2019-05-03 NOTE — OR Nursing (Signed)
Bronch start time 1327 End time 1345 Fluoroscopy  time 35 sec

## 2019-05-04 ENCOUNTER — Encounter: Payer: Self-pay | Admitting: Internal Medicine

## 2019-05-04 LAB — ACID FAST SMEAR (AFB, MYCOBACTERIA): Acid Fast Smear: NEGATIVE

## 2019-05-04 LAB — SURGICAL PATHOLOGY

## 2019-05-05 ENCOUNTER — Other Ambulatory Visit: Payer: Self-pay

## 2019-05-05 ENCOUNTER — Ambulatory Visit (INDEPENDENT_AMBULATORY_CARE_PROVIDER_SITE_OTHER): Payer: 59 | Admitting: Family Medicine

## 2019-05-05 ENCOUNTER — Telehealth: Payer: Self-pay

## 2019-05-05 ENCOUNTER — Encounter: Payer: Self-pay | Admitting: Family Medicine

## 2019-05-05 DIAGNOSIS — J8281 Chronic eosinophilic pneumonia: Secondary | ICD-10-CM

## 2019-05-05 DIAGNOSIS — Z0000 Encounter for general adult medical examination without abnormal findings: Secondary | ICD-10-CM | POA: Diagnosis not present

## 2019-05-05 DIAGNOSIS — Z7189 Other specified counseling: Secondary | ICD-10-CM

## 2019-05-05 DIAGNOSIS — R05 Cough: Secondary | ICD-10-CM

## 2019-05-05 DIAGNOSIS — E039 Hypothyroidism, unspecified: Secondary | ICD-10-CM

## 2019-05-05 DIAGNOSIS — R059 Cough, unspecified: Secondary | ICD-10-CM

## 2019-05-05 LAB — CULTURE, BAL-QUANTITATIVE W GRAM STAIN: Culture: 80000 — AB

## 2019-05-05 MED ORDER — LEVOTHYROXINE SODIUM 125 MCG PO TABS: ORAL_TABLET | ORAL | 12 refills | Status: DC

## 2019-05-05 MED ORDER — PREDNISONE 20 MG PO TABS: ORAL_TABLET | ORAL | 0 refills | Status: DC

## 2019-05-05 MED ORDER — SULFAMETHOXAZOLE-TRIMETHOPRIM 800-160 MG PO TABS: ORAL_TABLET | ORAL | 0 refills | Status: DC

## 2019-05-05 NOTE — Telephone Encounter (Signed)
As per dr Humphrey Rolls send bactrim take 1 tab M,W,F 15 and prednisone 20 mg take total  60 mg a daily 90 tab

## 2019-05-05 NOTE — Progress Notes (Signed)
Virtual visit completed through WebEx or similar program Patient location: home  Provider location: Financial controller at [facility], office   Pandemic considerations d/w pt.   Limitations and rationale for visit method d/w patient.  Patient agreed to proceed.   CC: CPE  HPI:  Tetanus tetanus likely ~2011.   Flu due in the fall PNA d/w pt.  I'll defer to pulmonary.   Shingles not due.   Living will d/w pt.  Would have her husband designated if patient were incapacitated.   Diet and exercise d/w pt.  Colon and breast cancer screening d/w pt.  Not due.  Pap done at gyn clinic.  Prev neg HPV and pap in 2018 with 5 year f/u.    Hypothyroidism.  No ADE on med.  No dysphagia, no neck mass.  Compliant.  TSH minimally elevated and d/w pt about recheck in 6 months.    Pulmonary f/u d/w pt.  S/p bronch, still with some mild sputum production.  No fevers.  Not SOB now.  Some days are worse than others.  We talked about her bronch results with cultures still pending.  She wanted to know about her plan going forward and her possible exposure to Covid with her kids potentially going back to school.  I told her I would talk with pulmonary.  PMH and SH reviewed  ROS: Per HPI unless specifically indicated in ROS section   Meds, vitals, and allergies reviewed.   NAD Speech wnl  A/P:  Tetanus tetanus likely ~2011.   Flu due in the fall PNA d/w pt.  I'll defer to pulmonary.   Shingles not due.   Living will d/w pt.  Would have her husband designated if patient were incapacitated.   Diet and exercise d/w pt.  Colon and breast cancer screening d/w pt.  Not due.  Pap done at gyn clinic.  Prev neg HPV and pap in 2018 with 5 year f/u.    Hypothyroidism.  No ADE on med.  No dysphagia, no neck mass.  Compliant.  TSH minimally elevated and d/w pt about recheck in 6 months.    Pulmonary f/u d/w pt.  S/p bronch, still with some mild sputum production.  No fevers.  Not SOB now.  Some days are worse than  others.  We talked about her bronch results with cultures still pending.  She wanted to know about her plan going forward and her possible exposure to Covid with her kids potentially going back to school.  I told her I would talk with pulmonary.  ======= Addendum.  I did talk with Dr. Humphrey Rolls.  We both recognize that the patient has concerns about her kids going back to school and potentially being exposed to [place].  I do not have an easy answer for the situation and neither does Dr. Humphrey Rolls.  I think we are going to have to defer to the patient in terms of balancing the risk of exposure versus the educational needs of her children.  He was also optimistic about the patient's overall prognosis, given her interval without symptoms and absence of severe findings on CT.  He expected her to need a course of steroids and she is going to follow-up with him and he will discuss the details with her.  I called her back to discuss the above and she said she appreciated the call.  I greatly appreciate the help of pulmonary and I will defer on these matters at this point.

## 2019-05-06 NOTE — H&P (Signed)
Please see the note from the office he will serve as the H&P there was no significant change in the patient's history and physical from the office note dated April 28, 2019

## 2019-05-08 ENCOUNTER — Encounter: Payer: Self-pay | Admitting: Family Medicine

## 2019-05-08 DIAGNOSIS — Z0000 Encounter for general adult medical examination without abnormal findings: Secondary | ICD-10-CM | POA: Insufficient documentation

## 2019-05-08 NOTE — Assessment & Plan Note (Signed)
  Pulmonary f/u d/w pt.  S/p bronch, still with some mild sputum production.  No fevers.  Not SOB now.  Some days are worse than others.  We talked about her bronch results with cultures still pending.  She wanted to know about her plan going forward and her possible exposure to Covid with her kids potentially going back to school.  I told her I would talk with pulmonary.  ======= Addendum.  I did talk with Dr. Humphrey Rolls.  We both recognize that the patient has concerns about her kids going back to school and potentially being exposed to [place].  I do not have an easy answer for the situation and neither does Dr. Humphrey Rolls.  I think we are going to have to defer to the patient in terms of balancing the risk of exposure versus the educational needs of her children.  He was also optimistic about the patient's overall prognosis, given her interval without symptoms and absence of severe findings on CT.  He expected her to need a course of steroids and she is going to follow-up with him and he will discuss the details with her.  I called her back to discuss the above and she said she appreciated the call.  I greatly appreciate the help of pulmonary and I will defer on these matters at this point.

## 2019-05-08 NOTE — Assessment & Plan Note (Signed)
Tetanus tetanus likely ~2011.   Flu due in the fall PNA d/w pt.  I'll defer to pulmonary.   Shingles not due.   Living will d/w pt.  Would have her husband designated if patient were incapacitated.   Diet and exercise d/w pt.  Colon and breast cancer screening d/w pt.  Not due.  Pap done at gyn clinic.  Prev neg HPV and pap in 2018 with 5 year f/u.

## 2019-05-08 NOTE — Assessment & Plan Note (Signed)
Living will d/w pt. Would have her husband designated if patient were incapacitated. 

## 2019-05-08 NOTE — Assessment & Plan Note (Signed)
Hypothyroidism.  No ADE on med.  No dysphagia, no neck mass.  Compliant.  TSH minimally elevated and d/w pt about recheck in 6 months.

## 2019-05-10 ENCOUNTER — Ambulatory Visit: Payer: 59 | Admitting: Internal Medicine

## 2019-05-16 ENCOUNTER — Encounter: Payer: Self-pay | Admitting: Internal Medicine

## 2019-05-16 ENCOUNTER — Other Ambulatory Visit: Payer: Self-pay

## 2019-05-16 ENCOUNTER — Ambulatory Visit (INDEPENDENT_AMBULATORY_CARE_PROVIDER_SITE_OTHER): Payer: 59 | Admitting: Internal Medicine

## 2019-05-16 VITALS — BP 112/74 | HR 62 | Resp 16 | Ht 62.0 in | Wt 197.8 lb

## 2019-05-16 DIAGNOSIS — R0602 Shortness of breath: Secondary | ICD-10-CM

## 2019-05-16 DIAGNOSIS — R059 Cough, unspecified: Secondary | ICD-10-CM

## 2019-05-16 DIAGNOSIS — R05 Cough: Secondary | ICD-10-CM | POA: Diagnosis not present

## 2019-05-16 DIAGNOSIS — J479 Bronchiectasis, uncomplicated: Secondary | ICD-10-CM | POA: Diagnosis not present

## 2019-05-16 DIAGNOSIS — J8281 Chronic eosinophilic pneumonia: Secondary | ICD-10-CM

## 2019-05-16 DIAGNOSIS — J82 Pulmonary eosinophilia, not elsewhere classified: Secondary | ICD-10-CM

## 2019-05-16 NOTE — Progress Notes (Signed)
[facility] [place], [place] [address]  Pulmonary Sleep Medicine   Office Visit Note  Patient Name: [name] DOB: 1981/09/23 MRN [id number]  Date of Service: 05/16/2019  Complaints/HPI: Patient returns to the office after bronchoscopy was done.  She had a bronchoscopy the biopsy did not show any particular inflammation.  The specimen was likely not a good specimen which I explained to the patient.  Appears to be more of a larger airways than alveoli.  She had 5% eosinophils on the washing cell count.  She was started on steroids and she states that she is feeling better.  I reviewed her CT scan with her which does show significant bronchiectasis we reviewed a CT scan that was done previously which did show some bronchiectasis but not as pronounced as it is now.  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  She is understanding of this and very realistic.  Also explained to her that she will be prone to infections and that at any sign of infection she should call our office and get seen right away.  She is taking the Bactrim as prescribed for PCP prophylaxis.  ROS  General: (-) fever, (-) chills, (-) night sweats, (-) weakness Skin: (-) rashes, (-) itching,. Eyes: (-) visual changes, (-) redness, (-) itching. Nose and Sinuses: (-) nasal stuffiness or itchiness, (-) postnasal drip, (-) nosebleeds, (-) sinus trouble. Mouth and Throat: (-) sore throat, (-) hoarseness. Neck: (-) swollen glands, (-) enlarged thyroid, (-) neck pain. Respiratory: + cough, (-) bloody sputum, + shortness of breath, - wheezing. Cardiovascular: - ankle swelling, (-) chest pain. Lymphatic: (-) lymph node enlargement. Neurologic: (-) numbness, (-) tingling. Psychiatric: (-) anxiety, (-) depression   Current Medication: Outpatient Encounter  Medications as of 05/16/2019  Medication Sig  . albuterol (PROVENTIL HFA;VENTOLIN HFA) 108 (90 Base) MCG/ACT inhaler Inhale 2 puffs into the lungs every 4 (four) hours as needed for wheezing or shortness of breath.  . levothyroxine (SYNTHROID) 125 MCG tablet Take 1.5 tablets (187.5 mcg) by mouth on Sundays, then take 1 tablet (125 mcg) by mouth on all other days.  . predniSONE (DELTASONE) 20 MG tablet Take 3 tab po daily (total 60 mg daily )  . sulfamethoxazole-trimethoprim (BACTRIM DS) 800-160 MG tablet Take 1 tab po Monday,wednesday  and Friday   No facility-administered encounter medications on file as of 05/16/2019.     Surgical History: Past Surgical History:  Procedure Laterality Date  . CESAREAN SECTION  2007  . CHOLECYSTECTOMY N/A 10/27/2016   Procedure: LAPAROSCOPIC CHOLECYSTECTOMY;  Surgeon: Jules Husbands, MD;  Location: [facility] ORS;  Service: General;  Laterality: N/A;  . DIAGNOSTIC LAPAROSCOPY  2018   X2  . FLEXIBLE BRONCHOSCOPY N/A 05/03/2019   Procedure: FLEXIBLE BRONCHOSCOPY;  Surgeon: Allyne Gee, MD;  Location: [facility] ORS;  Service: Pulmonary;  Laterality: N/A;  . LUNG BIOPSY Right 2008   partial right lung resection.  was treated with steroids x 1 year  . TONSILLECTOMY AND ADENOIDECTOMY      Medical History: Past Medical History:  Diagnosis Date  . Anemia    Iron Deficiency in the past  . Asthma    in childhood  . Bowen's disease   . Complication of anesthesia    HARD TO WAKE UP SOMETIIMES  . Dyspnea 04/2019   sob and coughing has increased of  late  . Endometriosis    dx'd in high school  . Eosinophilic pneumonia ([place]) ~87/8676   per Dr. Clayborn Bigness s/p VATS and 1 year of prednisone  . GERD (gastroesophageal reflux disease)    TUMS PRN  . Headache    MIGRAINES without aura  . History of abnormal Pap smear   . History of chicken pox   . Hypothyroidism    dx'd 2011  . Pneumonia 2009/ 7209   EOSINOPHILIC PNEUMONIA  . PONV (postoperative nausea and vomiting)    . Seizures ([place])    childhood-previously on Tegretol-been off meds for years with no seizures  . Urinary tract bacterial infections     Family History: Family History  Problem Relation Age of Onset  . Mental illness Mother        bipolar  . Cancer Father        h/o throat cancer- treated  . Heart disease Maternal Grandfather        at ate >60  . Alcohol abuse Maternal Grandfather   . Stomach cancer Neg Hx   . Rectal cancer Neg Hx   . Esophageal cancer Neg Hx   . Colon cancer Neg Hx   . Breast cancer Neg Hx     Social History: Social History   Socioeconomic History  . Marital status: Married    Spouse name: cory  . Number of children: 2  . Years of education: Not on file  . Highest education level: Not on file  Occupational History  . Occupation: Passenger transport manager    Comment: currently working  Social Needs  . Financial resource strain: Not on file  . Food insecurity    Worry: Not on file    Inability: Not on file  . Transportation needs    Medical: Not on file    Non-medical: Not on file  Tobacco Use  . Smoking status: Former Smoker    Packs/day: 0.50    Years: 10.00    Pack years: 5.00    Types: Cigarettes    Quit date: 10/21/2004    Years since quitting: 14.5  . Smokeless tobacco: Never Used  Substance and Sexual Activity  . Alcohol use: Yes    Comment: rare  . Drug use: No  . Sexual activity: Yes    Partners: Male    Birth control/protection: Condom  Lifestyle  . Physical activity    Days per week: Not on file    Minutes per session: Not on file  . Stress: Not on file  Relationships  . Social Herbalist on phone: Not on file    Gets together: Not on file    Attends religious service: Not on file    Active member of club or organization: Not on file    Attends meetings of clubs or organizations: Not on file    Relationship status: Not on file  . Intimate partner violence    Fear of current or ex partner: Not on file    Emotionally  abused: Not on file    Physically abused: Not on file    Forced sexual activity: Not on file  Other Topics Concern  . Not on file  Social History Narrative   Caffeine use:  Yes   Regular exercise:  Yes   2 pregnancies = 2 live births, 1 son and 1 daughter   From [place], in [place] since 2006    Vital Signs: Blood pressure 112/74, pulse 62, resp. rate 16,  height _0  (1.575 m), weight 197 lb 12.8 oz (89.7 kg), last menstrual period 05/02/2019, SpO2 99 %.  Examination: General Appearance: The patient is well-developed, well-nourished, and in no distress. Skin: Gross inspection of skin unremarkable. Head: normocephalic, no gross deformities. Eyes: no gross deformities noted. ENT: ears appear grossly normal no exudates. Neck: Supple. No thyromegaly. No LAD. Respiratory: few rhonchi noted. Cardiovascular: Normal S1 and S2 without murmur or rub. Extremities: No cyanosis. pulses are equal. Neurologic: Alert and oriented. No involuntary movements.  LABS: Recent Results (from the past 2160 hour(s))  CBC with Differential/Platelet     Status: None   Collection Time: 02/22/19 12:06 PM  Result Value Ref Range   WBC 4.6 4.0 - 10.5 K/uL   RBC 4.38 3.87 - 5.11 Mil/uL   Hemoglobin 12.3 12.0 - 15.0 g/dL   HCT 37.0 36.0 - 46.0 %   MCV 84.4 78.0 - 100.0 fl   MCHC 33.3 30.0 - 36.0 g/dL   RDW 14.2 11.5 - 15.5 %   Platelets 249.0 150.0 - 400.0 K/uL   Neutrophils Relative % 58.4 43.0 - 77.0 %   Lymphocytes Relative 27.3 12.0 - 46.0 %   Monocytes Relative 9.5 3.0 - 12.0 %   Eosinophils Relative 4.3 0.0 - 5.0 %   Basophils Relative 0.5 0.0 - 3.0 %   Neutro Abs 2.7 1.4 - 7.7 K/uL   Lymphs Abs 1.3 0.7 - 4.0 K/uL   Monocytes Absolute 0.4 0.1 - 1.0 K/uL   Eosinophils Absolute 0.2 0.0 - 0.7 K/uL   Basophils Absolute 0.0 0.0 - 0.1 K/uL  Basic metabolic panel     Status: Abnormal   Collection Time: 02/22/19 12:06 PM  Result Value Ref Range   Sodium 138 135 - 145 mEq/L   Potassium 4.0 3.5 - 5.1 mEq/L    Chloride 104 96 - 112 mEq/L   CO2 29 19 - 32 mEq/L   Glucose, Bld 80 70 - 99 mg/dL   BUN 5 (L) 6 - 23 mg/dL   Creatinine, Ser 0.70 0.40 - 1.20 mg/dL   Calcium 9.0 8.4 - 10.5 mg/dL   GFR 94.74 >60.00 mL/min  Basic metabolic panel     Status: None   Collection Time: 04/28/19  8:16 AM  Result Value Ref Range   Sodium 138 135 - 145 mEq/L   Potassium 4.3 3.5 - 5.1 mEq/L   Chloride 104 96 - 112 mEq/L   CO2 28 19 - 32 mEq/L   Glucose, Bld 96 70 - 99 mg/dL   BUN 8 6 - 23 mg/dL   Creatinine, Ser 0.73 0.40 - 1.20 mg/dL   Calcium 9.2 8.4 - 10.5 mg/dL   GFR 90.17 >60.00 mL/min  CBC with Differential/Platelet     Status: None   Collection Time: 04/28/19  8:16 AM  Result Value Ref Range   WBC 7.3 4.0 - 10.5 K/uL   RBC 4.52 3.87 - 5.11 Mil/uL   Hemoglobin 12.4 12.0 - 15.0 g/dL   HCT 37.6 36.0 - 46.0 %   MCV 83.3 78.0 - 100.0 fl   MCHC 33.0 30.0 - 36.0 g/dL   RDW 15.0 11.5 - 15.5 %   Platelets 276.0 150.0 - 400.0 K/uL   Neutrophils Relative % 70.9 43.0 - 77.0 %   Lymphocytes Relative 17.1 12.0 - 46.0 %   Monocytes Relative 7.3 3.0 - 12.0 %   Eosinophils Relative 4.3 0.0 - 5.0 %   Basophils Relative 0.4 0.0 - 3.0 %   Neutro  Abs 5.2 1.4 - 7.7 K/uL   Lymphs Abs 1.3 0.7 - 4.0 K/uL   Monocytes Absolute 0.5 0.1 - 1.0 K/uL   Eosinophils Absolute 0.3 0.0 - 0.7 K/uL   Basophils Absolute 0.0 0.0 - 0.1 K/uL  TSH     Status: Abnormal   Collection Time: 04/28/19  8:16 AM  Result Value Ref Range   TSH 4.56 (H) 0.35 - 4.50 uIU/mL  Lipid panel     Status: None   Collection Time: 04/28/19  8:16 AM  Result Value Ref Range   Cholesterol 148 0 - 200 mg/dL    Comment: ATP III Classification       Desirable:  < 200 mg/dL               Borderline High:  200 - 239 mg/dL          High:  > = 240 mg/dL   Triglycerides 149.0 0.0 - 149.0 mg/dL    Comment: Normal:  <150 mg/dLBorderline High:  150 - 199 mg/dL   HDL 42.50 >39.00 mg/dL   VLDL 29.8 0.0 - 40.0 mg/dL   LDL Cholesterol 76 0 - 99 mg/dL   Total  CHOL/HDL Ratio 3     Comment:                Men          Women1/2 Average Risk     3.4          3.3Average Risk          5.0          4.42X Average Risk          9.6          7.13X Average Risk          15.0          11.0                       NonHDL 105.83     Comment: NOTE:  Non-HDL goal should be 30 mg/dL higher than patient's LDL goal (i.e. LDL goal of < 70 mg/dL, would have non-HDL goal of < 100 mg/dL)  SARS Coronavirus 2 (Performed in [place] hospital lab)     Status: None   Collection Time: 04/29/19  1:19 PM   Specimen: Nasal Swab  Result Value Ref Range   SARS Coronavirus 2 NEGATIVE NEGATIVE    Comment: (NOTE) SARS-CoV-2 target nucleic acids are NOT DETECTED. The SARS-CoV-2 RNA is generally detectable in upper and lower respiratory specimens during the acute phase of infection. Negative results do not preclude SARS-CoV-2 infection, do not rule out co-infections with other pathogens, and should not be used as the sole basis for treatment or other patient management decisions. Negative results must be combined with clinical observations, patient history, and epidemiological information. The expected result is Negative. Fact Sheet for Patients: SugarRoll.be Fact Sheet for Healthcare Providers: https://www.woods-mathews.com/ This test is not yet approved or cleared by the [place] FDA and  has been authorized for detection and/or diagnosis of SARS-CoV-2 by FDA under an Emergency Use Authorization (EUA). This EUA will remain  in effect (meaning this test can be used) for the duration of the COVID-19 declaration under Section 56 4(b)(1) of the Act, 21 U.S.C. section 360bbb-3(b)(1), unless the authorization is terminated or revoked sooner. Performed at [facility], [place] [address], [place], [place] [address]   Pregnancy, urine POC  Status: None   Collection Time: 05/03/19 12:15 PM  Result Value Ref Range   Preg Test, Ur  NEGATIVE NEGATIVE    Comment:        THE SENSITIVITY OF THIS METHODOLOGY IS >24 mIU/mL   Surgical pathology     Status: None   Collection Time: 05/03/19  1:53 PM  Result Value Ref Range   SURGICAL PATHOLOGY      Surgical Pathology CASE: [id number] PATIENT: Yasmina Mcgaughy Surgical Pathology Report     SPECIMEN SUBMITTED: A. Lung, right lower lobe  CLINICAL HISTORY: None provided  PRE-OPERATIVE DIAGNOSIS: None provided  POST-OPERATIVE DIAGNOSIS: None provided.    DIAGNOSIS: A. LUNG, RIGHT LOWER LOBE; FIBEROPTIC BIOPSY: - FRAGMENTS OF BENIGN BRONCHIAL WALL AND FOCAL DETACHED UNREMARKABLE BRONCHIAL EPITHELIAL CELLS. - NO INCREASE IN EOSINOPHILS (LESS THAN 1 PER HPF) - NEGATIVE FOR TUMOR AND GRANULOMA.  Comment: The patient's remote history of eosinophilic pneumonia is noted. Sections display portions of bronchial wall with thin submucosal smooth muscle bundles and unremarkable bronchial epithelial cells.  There is no increase in eosinophils or other inflammatory cells.  There are no fibroblastic foci, fibrosis, or other changes to suggest interstitial lung disease in the current sampling.  This case is reviewed with a concurrent cytospin cytology specimen . This specimen displays a mixed inflammatory cell component, with benign bronchial epithelial cells and pulmonary macrophages. A 200 cell differential count was performed, with the following results: Neutrophils - 65.5% Lymphocytes - 22.0% Monocytes/macrophages - 7.5% Eosinophils - 5.0%  Clinical and radiographic correlation is recommended.  GROSS DESCRIPTION: A. Site: Right lower lobe lung Procedure: Bronch Cytotechnologist: Molli Barrows, Ashlee Howze-Soremekun Specimen(s) collected: 2 diff Quik stained slides  Received: In formalin Tissue fragment(s): Several Size: 0.3 x 0.2 x 0.1 cm Description: Pink-red tissue fragments Entirely submitted in 1 cassette.      Final Diagnosis performed  by Allena Napoleon, MD.   Electronically signed 05/04/2019 11:10:01AM The electronic signature indicates that the named Attending Pathologist has evaluated the specimen  Technical component performed at [facility], [address] [place], [place] [address] Lab: [phone] Dir: Mercy [name], MD, MMM  Professional component performed at [facility], [facility], [place], [place], [place] [address] Lab: [phone] Dir: Dellia Nims. Rubinas, MD   Acid Fast Smear (AFB)     Status: None   Collection Time: 05/03/19  2:35 PM   Specimen: Bronchoalveolar Lavage  Result Value Ref Range   AFB Specimen Processing Concentration    Acid Fast Smear Negative     Comment: (NOTE) Performed At: [facility] [place], [place] [id number] Rush Farmer MD YO:[id number]    Source (AFB) BRONCHIAL ALVEOLAR LAVAGE     Comment: Performed at [facility], [address] [place], [place] [address]  Fungus Culture With Stain     Status: Abnormal   Collection Time: 05/03/19  2:35 PM  Result Value Ref Range   Fungus Stain Final report    Fungus (Mycology) Culture Preliminary report (A)     Comment: (NOTE) Performed At: [facility] [address] [place], [place] [id number] Rush Farmer MD OB:[id number]    Fungal Source BRONCHIAL ALVEOLAR LAVAGE     Comment: Performed at [facility], [address] [place], [place] [address]  Culture, bal-quantitative     Status: Abnormal   Collection Time: 05/03/19  2:35 PM   Specimen: Bronchoalveolar Lavage  Result Value Ref Range   Specimen Description      BRONCHIAL ALVEOLAR LAVAGE  Performed at [facility], [address] [place], [place] [address]    Special Requests      NONE Performed at [facility], [place], [place] [address]    Gram Stain      RARE WBC PRESENT, PREDOMINANTLY PMN RARE GRAM POSITIVE COCCI    Culture (A)     80,000 COLONIES/mL  Consistent with normal respiratory flora. Performed at [facility], [place] [address] [place], [place] [address]    Report Status 05/05/2019 FINAL   Fungus Culture Result     Status: None   Collection Time: 05/03/19  2:35 PM  Result Value Ref Range   Result 1 Comment     Comment: (NOTE) KOH/Calcofluor preparation:  no fungus observed. Performed At: [facility] [place], [place] [id number] Rush Farmer MD YT:[id number]   Fungal organism reflex     Status: Abnormal   Collection Time: 05/03/19  2:35 PM  Result Value Ref Range   Fungal result 1 Candida albicans (A)     Comment: (NOTE) 1-2 colonies                                            . Performed At: [facility] [place], [place] [id number] Rush Farmer MD KY:[id number]     Radiology: Dg Chest Port 1 View  Result Date: 05/03/2019 CLINICAL DATA:  Status post bronchoscopy. EXAM: PORTABLE CHEST 1 VIEW COMPARISON:  Chest x-rays dated 02/22/2019 and 02/03/2019. FINDINGS: Stable chronic scarring/atelectasis at the RIGHT lung base. Lungs otherwise clear. No pleural effusion or pneumothorax seen. Osseous structures about the chest are unremarkable. IMPRESSION: No evidence of acute cardiopulmonary abnormality. No pneumothorax seen. Electronically Signed   By: Franki Cabot M.D.   On: 05/03/2019 14:16   Dg C-arm 1-60 Min-no Report  Result Date: 05/03/2019 Fluoroscopy was utilized by the requesting physician.  No radiographic interpretation.    No results found.  Ct Chest Wo Contrast  Result Date: 04/21/2019 CLINICAL DATA:  Chronic eosinophilic pneumonia with history partial right lung resection in 2008. Persistent dyspnea. Treated with antibiotic therapy recently for pneumonia. EXAM: CT CHEST WITHOUT CONTRAST TECHNIQUE: Multidetector CT imaging of the chest was performed following the standard protocol without IV contrast. COMPARISON:  09/27/2012 chest CT. FINDINGS: Cardiovascular:  Normal heart size. Stable trace pericardial effusion/thickening. Great vessels are normal in course and caliber. Mediastinum/Nodes: No discrete thyroid nodules. Unremarkable esophagus. No pathologically enlarged axillary, mediastinal or hilar lymph nodes, noting limited sensitivity for the detection of hilar adenopathy on this noncontrast study. Lungs/Pleura: No pneumothorax. No pleural effusion. No acute consolidative airspace disease, lung masses or significant pulmonary nodules. Wedge resection suture lines again noted at the periphery of right upper, right middle and right lower lobes. There is patchy confluent peribronchovascular and subpleural ground-glass opacity and reticulation in both lungs with a strong basilar predominance, with associated moderate traction bronchiectasis, architectural distortion and volume loss. Findings have clearly progressed since 2013 chest CT. No frank honeycombing. Upper abdomen: Cholecystectomy. Musculoskeletal:  No aggressive appearing focal osseous lesions. IMPRESSION: Spectrum of findings compatible with basilar predominant fibrotic interstitial lung disease with moderate traction bronchiectasis and no honeycombing. Findings have clearly progressed since 2013 chest CT. The imaging pattern is most compatible with usual interstitial pneumonia (UIP). The basilar predominance is atypical for chronic eosinophilic pneumonia. Electronically Signed   By: Corene Cornea  A Poff M.D.   On: 04/21/2019 17:31   Dg Chest Port 1 View  Result Date: 05/03/2019 CLINICAL DATA:  Status post bronchoscopy. EXAM: PORTABLE CHEST 1 VIEW COMPARISON:  Chest x-rays dated 02/22/2019 and 02/03/2019. FINDINGS: Stable chronic scarring/atelectasis at the RIGHT lung base. Lungs otherwise clear. No pleural effusion or pneumothorax seen. Osseous structures about the chest are unremarkable. IMPRESSION: No evidence of acute cardiopulmonary abnormality. No pneumothorax seen. Electronically Signed   By: Franki Cabot  M.D.   On: 05/03/2019 14:16   Dg C-arm 1-60 Min-no Report  Result Date: 05/03/2019 Fluoroscopy was utilized by the requesting physician.  No radiographic interpretation.      Assessment and Plan: Patient Active Problem List   Diagnosis Date Noted  . Routine general medical examination at a health care facility 05/08/2019  . Lower respiratory infection 01/26/2019  . Lumbar radicular pain 12/21/2017  . Advance care planning 05/04/2017  . Calculus of gallbladder without cholecystitis without obstruction 08/05/2016  . Migraine without aura 04/18/2012  . Cough 02/06/2012  . Eosinophilic pneumonia ([place]) 62/37/6283  . Hypothyroidism 02/07/2011    1. Eosinophilic pneumonia patient had previously responded very well to steroids she is starting to feel better on the steroids though she states she is not getting as quickly better as she had previously.  She does have significant bronchiectasis only on the lung on the CT scan.  We are going to get follow-up CT scan and this was discussed with her at length. 2. Cough she states the cough has improved significantly and she will continue with current management also discussed with her reflux prevention etc. 3. SOB slowly improving we will continue with steroids as already discussed above follow-up pulmonary functions already ordered. 4. Steroid therapy 48m week 2 will be completed this week she will continue on the 60 mg for the next 6 weeks at least.  In addition we will be getting follow-up CT scan to look at progression of disease.   General Counseling: I have discussed the findings of the evaluation and examination with JAnderson Malta  I have also discussed any further diagnostic evaluation thatmay be needed or ordered today. Fraya verbalizes understanding of the findings of todays visit. We also reviewed her medications today and discussed drug interactions and side effects including but not limited excessive drowsiness and altered mental states.  We also discussed that there is always a risk not just to her but also people around her. she has been encouraged to call the office with any questions or concerns that should arise related to todays visit.    Time spent: 227m  I have personally obtained a history, examined the patient, evaluated laboratory and imaging results, formulated the assessment and plan and placed orders.    SaAllyne GeeMD FCEdgerton Hospital And Health Servicesulmonary and Critical Care Sleep medicine

## 2019-05-16 NOTE — Patient Instructions (Signed)
Prednisone tablets What is this medicine? PREDNISONE (PRED ni sone) is a corticosteroid. It is commonly used to treat inflammation of the skin, joints, lungs, and other organs. Common conditions treated include asthma, allergies, and arthritis. It is also used for other conditions, such as blood disorders and diseases of the adrenal glands. This medicine may be used for other purposes; ask your health care provider or pharmacist if you have questions. COMMON BRAND NAME(S): Deltasone, Predone, Sterapred, Sterapred DS What should I tell my health care provider before I take this medicine? They need to know if you have any of these conditions:  Cushing's syndrome  diabetes  glaucoma  heart disease  high blood pressure  infection (especially a virus infection such as chickenpox, cold sores, or herpes)  kidney disease  liver disease  mental illness  myasthenia gravis  osteoporosis  seizures  stomach or intestine problems  thyroid disease  an unusual or allergic reaction to lactose, prednisone, other medicines, foods, dyes, or preservatives  pregnant or trying to get pregnant  breast-feeding How should I use this medicine? Take this medicine by mouth with a glass of water. Follow the directions on the prescription label. Take this medicine with food. If you are taking this medicine once a day, take it in the morning. Do not take more medicine than you are told to take. Do not suddenly stop taking your medicine because you may develop a severe reaction. Your doctor will tell you how much medicine to take. If your doctor wants you to stop the medicine, the dose may be slowly lowered over time to avoid any side effects. Talk to your pediatrician regarding the use of this medicine in children. Special care may be needed. Overdosage: If you think you have taken too much of this medicine contact a poison control center or emergency room at once. NOTE: This medicine is only for you.  Do not share this medicine with others. What if I miss a dose? If you miss a dose, take it as soon as you can. If it is almost time for your next dose, talk to your doctor or health care professional. You may need to miss a dose or take an extra dose. Do not take double or extra doses without advice. What may interact with this medicine? Do not take this medicine with any of the following medications:  metyrapone  mifepristone This medicine may also interact with the following medications:  aminoglutethimide  amphotericin B  aspirin and aspirin-like medicines  barbiturates  certain medicines for diabetes, like glipizide or glyburide  cholestyramine  cholinesterase inhibitors  cyclosporine  digoxin  diuretics  ephedrine  female hormones, like estrogens and birth control pills  isoniazid  ketoconazole  NSAIDS, medicines for pain and inflammation, like ibuprofen or naproxen  phenytoin  rifampin  toxoids  vaccines  warfarin This list may not describe all possible interactions. Give your health care provider a list of all the medicines, herbs, non-prescription drugs, or dietary supplements you use. Also tell them if you smoke, drink alcohol, or use illegal drugs. Some items may interact with your medicine. What should I watch for while using this medicine? Visit your doctor or health care professional for regular checks on your progress. If you are taking this medicine over a prolonged period, carry an identification card with your name and address, the type and dose of your medicine, and your doctor's name and address. This medicine may increase your risk of getting an infection. Tell your doctor or   health care professional if you are around anyone with measles or chickenpox, or if you develop sores or blisters that do not heal properly. If you are going to have surgery, tell your doctor or health care professional that you have taken this medicine within the last  twelve months. Ask your doctor or health care professional about your diet. You may need to lower the amount of salt you eat. This medicine may increase blood sugar. Ask your healthcare provider if changes in diet or medicines are needed if you have diabetes. What side effects may I notice from receiving this medicine? Side effects that you should report to your doctor or health care professional as soon as possible:  allergic reactions like skin rash, itching or hives, swelling of the face, lips, or tongue  changes in emotions or moods  changes in vision  depressed mood  eye pain  fever or chills, cough, sore throat, pain or difficulty passing urine  signs and symptoms of high blood sugar such as being more thirsty or hungry or having to urinate more than normal. You may also feel very tired or have blurry vision.  swelling of ankles, feet Side effects that usually do not require medical attention (report to your doctor or health care professional if they continue or are bothersome):  confusion, excitement, restlessness  headache  nausea, vomiting  skin problems, acne, thin and shiny skin  trouble sleeping  weight gain This list may not describe all possible side effects. Call your doctor for medical advice about side effects. You may report side effects to FDA at 1-800-FDA-1088. Where should I keep my medicine? Keep out of the reach of children. Store at room temperature between 15 and 30 degrees C (59 and 86 degrees F). Protect from light. Keep container tightly closed. Throw away any unused medicine after the expiration date. NOTE: This sheet is a summary. It may not cover all possible information. If you have questions about this medicine, talk to your doctor, pharmacist, or health care provider.  2020 [employer] (2018-07-06 10:54:22)  

## 2019-05-18 ENCOUNTER — Other Ambulatory Visit: Payer: Self-pay

## 2019-05-18 ENCOUNTER — Ambulatory Visit: Payer: 59 | Admitting: Internal Medicine

## 2019-05-18 DIAGNOSIS — R0602 Shortness of breath: Secondary | ICD-10-CM | POA: Diagnosis not present

## 2019-05-18 LAB — PULMONARY FUNCTION TEST

## 2019-05-19 ENCOUNTER — Other Ambulatory Visit: Payer: Self-pay | Admitting: Family Medicine

## 2019-05-19 DIAGNOSIS — E039 Hypothyroidism, unspecified: Secondary | ICD-10-CM

## 2019-05-20 NOTE — Telephone Encounter (Signed)
Refilled. Ned to follow up 6 months for labs

## 2019-05-22 NOTE — Procedures (Signed)
[place], [id number]  DATE OF SERVICE: May 18, 2019  Complete Pulmonary Function Testing Interpretation:  FINDINGS:  The forced vital capacity is moderately decreased.  FEV1 is 2.13 L which is 64% of predicted and is moderately decreased.  FEV1 FVC ratio is normal.  Total lung capacity is mildly decreased.  Residual volume is decreased.  Residual volume total lung capacity ratio is normal.  Total gas volume is mildly decreased.  DLCO is moderately decreased.  IMPRESSION:  This pulmonary function study is consistent with mild restrictive lung disease.  DLCO was also reduced  Allyne Gee, MD [facility] Pulmonary Critical Care Medicine Sleep Medicine

## 2019-06-02 LAB — FUNGUS CULTURE WITH STAIN

## 2019-06-02 LAB — FUNGAL ORGANISM REFLEX

## 2019-06-02 LAB — FUNGUS CULTURE RESULT

## 2019-06-03 ENCOUNTER — Other Ambulatory Visit: Payer: Self-pay

## 2019-06-03 MED ORDER — SULFAMETHOXAZOLE-TRIMETHOPRIM 800-160 MG PO TABS: ORAL_TABLET | ORAL | 0 refills | Status: DC

## 2019-06-03 MED ORDER — PREDNISONE 20 MG PO TABS: ORAL_TABLET | ORAL | 0 refills | Status: DC

## 2019-06-13 ENCOUNTER — Ambulatory Visit: Payer: 59 | Admitting: Internal Medicine

## 2019-06-13 ENCOUNTER — Encounter: Payer: Self-pay | Admitting: Internal Medicine

## 2019-06-13 ENCOUNTER — Other Ambulatory Visit: Payer: Self-pay

## 2019-06-13 VITALS — BP 104/72 | HR 68 | Resp 16 | Ht 62.0 in | Wt 197.0 lb

## 2019-06-13 DIAGNOSIS — J301 Allergic rhinitis due to pollen: Secondary | ICD-10-CM | POA: Diagnosis not present

## 2019-06-13 DIAGNOSIS — J479 Bronchiectasis, uncomplicated: Secondary | ICD-10-CM | POA: Diagnosis not present

## 2019-06-13 DIAGNOSIS — J82 Pulmonary eosinophilia, not elsewhere classified: Secondary | ICD-10-CM

## 2019-06-13 DIAGNOSIS — J8281 Chronic eosinophilic pneumonia: Secondary | ICD-10-CM

## 2019-06-13 DIAGNOSIS — R0602 Shortness of breath: Secondary | ICD-10-CM

## 2019-06-13 MED ORDER — SULFAMETHOXAZOLE-TRIMETHOPRIM 800-160 MG PO TABS: ORAL_TABLET | ORAL | 0 refills | Status: DC

## 2019-06-13 MED ORDER — PREDNISONE 20 MG PO TABS: 20.0000 mg | ORAL_TABLET | Freq: Every day | ORAL | 2 refills | Status: DC

## 2019-06-13 NOTE — Patient Instructions (Signed)
Prednisone delayed-release tablets What is this medicine? PREDNISONE (PRED ni sone) is a corticosteroid. It is commonly used to treat inflammation of the skin, joints, lungs, and other organs. Common conditions treated include asthma, allergies, and arthritis. It is also used for other conditions, such as blood disorders and diseases of the adrenal glands. This medicine may be used for other purposes; ask your health care provider or pharmacist if you have questions. COMMON BRAND NAME(S): RAYOS What should I tell my health care provider before I take this medicine? They need to know if you have any of these conditions:  Cushing's syndrome  diabetes  glaucoma  heart disease  high blood pressure  infection (especially a virus infection such as chickenpox, cold sores, or herpes)  kidney disease  liver disease  mental illness  myasthenia gravis  osteoporosis  seizures  stomach or intestine problems  thyroid disease  an unusual or allergic reaction to lactose, prednisone, other medicines, foods, dyes, or preservatives  pregnant or trying to get pregnant  breast-feeding How should I use this medicine? Take this medicine by mouth with a glass of water. Follow the directions on the prescription label. Take this medicine with food. Do not cut, crush or chew this medicine. Do not suddenly stop taking your medicine because you may develop a severe reaction. If your doctor wants you to stop the medicine, the dose may be slowly lowered over time to avoid any side effects. Talk to your pediatrician regarding the use of this medicine in children. Special care may be needed. Overdosage: If you think you have taken too much of this medicine contact a poison control center or emergency room at once. NOTE: This medicine is only for you. Do not share this medicine with others. What if I miss a dose? If you miss a dose, take it as soon as you can. If it is almost time for your next dose,  talk to your doctor or health care professional. You may need to miss a dose or take an extra dose. Do not take double or extra doses without advice. What may interact with this medicine? Do not take this medicine with any of the following medications:  metyrapone  mifepristone This medicine may also interact with the following medications:  aminoglutethimide  amphotericin B  aspirin and aspirin-like medicines  barbiturates  certain medicines for diabetes, like glipizide or glyburide  cholestyramine  cholinesterase inhibitors  cyclosporine  digoxin  diuretics  ephedrine  female hormones, like estrogens and birth control pills  isoniazid  ketoconazole  NSAIDS, medicines for pain and inflammation, like ibuprofen or naproxen  phenytoin  rifampin  toxoids  vaccines  warfarin This list may not describe all possible interactions. Give your health care provider a list of all the medicines, herbs, non-prescription drugs, or dietary supplements you use. Also tell them if you smoke, drink alcohol, or use illegal drugs. Some items may interact with your medicine. What should I watch for while using this medicine? Visit your doctor or health care professional for regular checks on your progress. If you are taking this medicine over a prolonged period, carry an identification card with your name and address, the type and dose of your medicine, and your doctor's name and address. This medicine may increase your risk of getting an infection. Tell your doctor or health care professional if you are around anyone with measles or chickenpox, or if you develop sores or blisters that do not heal properly. If you are going to have   surgery, tell your doctor or health care professional that you have taken this medicine within the last twelve months. Ask your doctor or health care professional about your diet. You may need to lower the amount of salt you eat. This medicine may increase  blood sugar. Ask your healthcare provider if changes in diet or medicines are needed if you have diabetes. What side effects may I notice from receiving this medicine? Side effects that you should report to your doctor or health care professional as soon as possible:  allergic reactions like skin rash, itching or hives, swelling of the face, lips, or tongue  changes in emotions or moods  changes in vision  depressed mood  eye pain  fever or chills, cough, sore throat, pain or difficulty passing urine  signs and symptoms of high blood sugar such as being more thirsty or hungry or having to urinate more than normal. You may also feel very tired or have blurry vision.  swelling of ankles, feet Side effects that usually do not require medical attention (report to your doctor or health care professional if they continue or are bothersome):  confusion, excitement, restlessness  headache  nausea, vomiting  skin problems, acne, thin and shiny skin  trouble sleeping  weight gain This list may not describe all possible side effects. Call your doctor for medical advice about side effects. You may report side effects to FDA at 1-800-FDA-1088. Where should I keep my medicine? Keep out of the reach of children. Store at room temperature between 15 and 30 degrees C (59 and 86 degrees F). Protect from light and moisture. Keep container tightly closed. Throw away any unused medicine after the expiration date. NOTE: This sheet is a summary. It may not cover all possible information. If you have questions about this medicine, talk to your doctor, pharmacist, or health care provider.  2020 [employer] Standard (2018-07-08 10:36:11)  

## 2019-06-13 NOTE — Progress Notes (Signed)
[facility] [place], [place] [address]  Pulmonary Sleep Medicine   Office Visit Note  Patient Name: [name] DOB: 25-Dec-1980 MRN [id number]  Date of Service: 06/13/2019  Complaints/HPI: She states that she is doing well she states that while she is not completely resolved as far as her symptoms are concerned she is feeling a little bit less short of breath.  Cough has improved.  She denies having any chest pain no palpitations.  She states that she has not noted any wheezing.  The patient has been on 60 mg of prednisone and is tolerating it fairly well.  She has had no significant weight gain she denies having any visual changes.  She has been taking her Bactrim as prescribed.  She needs refills for both of the drugs at this time.  ROS  General: (-) fever, (-) chills, (-) night sweats, (-) weakness Skin: (-) rashes, (-) itching,. Eyes: (-) visual changes, (-) redness, (-) itching. Nose and Sinuses: (-) nasal stuffiness or itchiness, (-) postnasal drip, (-) nosebleeds, (-) sinus trouble. Mouth and Throat: (-) sore throat, (-) hoarseness. Neck: (-) swollen glands, (-) enlarged thyroid, (-) neck pain. Respiratory: - cough, (-) bloody sputum, + shortness of breath, - wheezing. Cardiovascular: - ankle swelling, (-) chest pain. Lymphatic: (-) lymph node enlargement. Neurologic: (-) numbness, (-) tingling. Psychiatric: (-) anxiety, (-) depression   Current Medication: Outpatient Encounter Medications as of 06/13/2019  Medication Sig  . albuterol (PROVENTIL HFA;VENTOLIN HFA) 108 (90 Base) MCG/ACT inhaler Inhale 2 puffs into the lungs every 4 (four) hours as needed for wheezing or shortness of breath.  . levothyroxine (SYNTHROID) 125 MCG tablet TAKE 1 TABLET BY MOUTH ONCE DAILY EXCEPT TAKE 1 AND 1/2 TABLETS ON SUNDAYS  . predniSONE (DELTASONE) 20 MG tablet Take 3 tab po daily (total 60 mg daily ) (Patient not taking: Reported on 06/13/2019)  .  sulfamethoxazole-trimethoprim (BACTRIM DS) 800-160 MG tablet Take 1 tab po Monday,wednesday  and Friday (Patient not taking: Reported on 06/13/2019)   No facility-administered encounter medications on file as of 06/13/2019.     Surgical History: Past Surgical History:  Procedure Laterality Date  . CESAREAN SECTION  2007  . CHOLECYSTECTOMY N/A 10/27/2016   Procedure: LAPAROSCOPIC CHOLECYSTECTOMY;  Surgeon: Jules Husbands, MD;  Location: [facility] ORS;  Service: General;  Laterality: N/A;  . DIAGNOSTIC LAPAROSCOPY  2018   X2  . FLEXIBLE BRONCHOSCOPY N/A 05/03/2019   Procedure: FLEXIBLE BRONCHOSCOPY;  Surgeon: Allyne Gee, MD;  Location: [facility] ORS;  Service: Pulmonary;  Laterality: N/A;  . LUNG BIOPSY Right 2008   partial right lung resection.  was treated with steroids x 1 year  . TONSILLECTOMY AND ADENOIDECTOMY      Medical History: Past Medical History:  Diagnosis Date  . Anemia    Iron Deficiency in the past  . Asthma    in childhood  . Bowen's disease   . Complication of anesthesia    HARD TO WAKE UP SOMETIIMES  . Dyspnea 04/2019   sob and coughing has increased of late  . Endometriosis    dx'd in high school  . Eosinophilic pneumonia ([place]) ~00/9381   per Dr. Clayborn Bigness s/p VATS and 1 year of prednisone  . GERD (gastroesophageal reflux disease)    TUMS PRN  . Headache    MIGRAINES without aura  . History of abnormal Pap smear   . History of chicken pox   . Hypothyroidism    dx'd 2011  .  Pneumonia 2009/ 2505   EOSINOPHILIC PNEUMONIA  . PONV (postoperative nausea and vomiting)   . Seizures ([place])    childhood-previously on Tegretol-been off meds for years with no seizures  . Urinary tract bacterial infections     Family History: Family History  Problem Relation Age of Onset  . Mental illness Mother        bipolar  . Cancer Father        h/o throat cancer- treated  . Heart disease Maternal Grandfather        at ate >60  . Alcohol abuse Maternal Grandfather   .  Stomach cancer Neg Hx   . Rectal cancer Neg Hx   . Esophageal cancer Neg Hx   . Colon cancer Neg Hx   . Breast cancer Neg Hx     Social History: Social History   Socioeconomic History  . Marital status: Married    Spouse name: cory  . Number of children: 2  . Years of education: Not on file  . Highest education level: Not on file  Occupational History  . Occupation: Passenger transport manager    Comment: currently working  Social Needs  . Financial resource strain: Not on file  . Food insecurity    Worry: Not on file    Inability: Not on file  . Transportation needs    Medical: Not on file    Non-medical: Not on file  Tobacco Use  . Smoking status: Former Smoker    Packs/day: 0.50    Years: 10.00    Pack years: 5.00    Types: Cigarettes    Quit date: 10/21/2004    Years since quitting: 14.6  . Smokeless tobacco: Never Used  Substance and Sexual Activity  . Alcohol use: Yes    Comment: rare  . Drug use: No  . Sexual activity: Yes    Partners: Male    Birth control/protection: Condom  Lifestyle  . Physical activity    Days per week: Not on file    Minutes per session: Not on file  . Stress: Not on file  Relationships  . Social Herbalist on phone: Not on file    Gets together: Not on file    Attends religious service: Not on file    Active member of club or organization: Not on file    Attends meetings of clubs or organizations: Not on file    Relationship status: Not on file  . Intimate partner violence    Fear of current or ex partner: Not on file    Emotionally abused: Not on file    Physically abused: Not on file    Forced sexual activity: Not on file  Other Topics Concern  . Not on file  Social History Narrative   Caffeine use:  Yes   Regular exercise:  Yes   2 pregnancies = 2 live births, 1 son and 1 daughter   From [place], in [place] since 2006    Vital Signs: Blood pressure 104/72, pulse 68, resp. rate 16, height _0  (1.575 m), weight 197 lb (89.4  kg), SpO2 99 %.  Examination: General Appearance: The patient is well-developed, well-nourished, and in no distress. Skin: Gross inspection of skin unremarkable. Head: normocephalic, no gross deformities. Eyes: no gross deformities noted. ENT: ears appear grossly normal no exudates. Neck: Supple. No thyromegaly. No LAD. Respiratory: no rhonchi noted at this time. improved Cardiovascular: Normal S1 and S2 without murmur or rub. Extremities: No cyanosis.  pulses are equal. Neurologic: Alert and oriented. No involuntary movements.  LABS: Recent Results (from the past 2160 hour(s))  Basic metabolic panel     Status: None   Collection Time: 04/28/19  8:16 AM  Result Value Ref Range   Sodium 138 135 - 145 mEq/L   Potassium 4.3 3.5 - 5.1 mEq/L   Chloride 104 96 - 112 mEq/L   CO2 28 19 - 32 mEq/L   Glucose, Bld 96 70 - 99 mg/dL   BUN 8 6 - 23 mg/dL   Creatinine, Ser 0.73 0.40 - 1.20 mg/dL   Calcium 9.2 8.4 - 10.5 mg/dL   GFR 90.17 >60.00 mL/min  CBC with Differential/Platelet     Status: None   Collection Time: 04/28/19  8:16 AM  Result Value Ref Range   WBC 7.3 4.0 - 10.5 K/uL   RBC 4.52 3.87 - 5.11 Mil/uL   Hemoglobin 12.4 12.0 - 15.0 g/dL   HCT 37.6 36.0 - 46.0 %   MCV 83.3 78.0 - 100.0 fl   MCHC 33.0 30.0 - 36.0 g/dL   RDW 15.0 11.5 - 15.5 %   Platelets 276.0 150.0 - 400.0 K/uL   Neutrophils Relative % 70.9 43.0 - 77.0 %   Lymphocytes Relative 17.1 12.0 - 46.0 %   Monocytes Relative 7.3 3.0 - 12.0 %   Eosinophils Relative 4.3 0.0 - 5.0 %   Basophils Relative 0.4 0.0 - 3.0 %   Neutro Abs 5.2 1.4 - 7.7 K/uL   Lymphs Abs 1.3 0.7 - 4.0 K/uL   Monocytes Absolute 0.5 0.1 - 1.0 K/uL   Eosinophils Absolute 0.3 0.0 - 0.7 K/uL   Basophils Absolute 0.0 0.0 - 0.1 K/uL  TSH     Status: Abnormal   Collection Time: 04/28/19  8:16 AM  Result Value Ref Range   TSH 4.56 (H) 0.35 - 4.50 uIU/mL  Lipid panel     Status: None   Collection Time: 04/28/19  8:16 AM  Result Value Ref Range    Cholesterol 148 0 - 200 mg/dL    Comment: ATP III Classification       Desirable:  < 200 mg/dL               Borderline High:  200 - 239 mg/dL          High:  > = 240 mg/dL   Triglycerides 149.0 0.0 - 149.0 mg/dL    Comment: Normal:  <150 mg/dLBorderline High:  150 - 199 mg/dL   HDL 42.50 >39.00 mg/dL   VLDL 29.8 0.0 - 40.0 mg/dL   LDL Cholesterol 76 0 - 99 mg/dL   Total CHOL/HDL Ratio 3     Comment:                Men          Women1/2 Average Risk     3.4          3.3Average Risk          5.0          4.42X Average Risk          9.6          7.13X Average Risk          15.0          11.0                       NonHDL 105.83     Comment:  NOTE:  Non-HDL goal should be 30 mg/dL higher than patient's LDL goal (i.e. LDL goal of < 70 mg/dL, would have non-HDL goal of < 100 mg/dL)  SARS Coronavirus 2 (Performed in [facility])     Status: None   Collection Time: 04/29/19  1:19 PM   Specimen: Nasal Swab  Result Value Ref Range   SARS Coronavirus 2 NEGATIVE NEGATIVE    Comment: (NOTE) SARS-CoV-2 target nucleic acids are NOT DETECTED. The SARS-CoV-2 RNA is generally detectable in upper and lower respiratory specimens during the acute phase of infection. Negative results do not preclude SARS-CoV-2 infection, do not rule out co-infections with other pathogens, and should not be used as the sole basis for treatment or other patient management decisions. Negative results must be combined with clinical observations, patient history, and epidemiological information. The expected result is Negative. Fact Sheet for Patients: SugarRoll.be Fact Sheet for Healthcare Providers: https://www.woods-mathews.com/ This test is not yet approved or cleared by the [place] FDA and  has been authorized for detection and/or diagnosis of SARS-CoV-2 by FDA under an Emergency Use Authorization (EUA). This EUA will remain  in effect (meaning this test can be  used) for the duration of the COVID-19 declaration under Section 56 4(b)(1) of the Act, 21 U.S.C. section 360bbb-3(b)(1), unless the authorization is terminated or revoked sooner. Performed at [facility], [place] [address] [place], [place] [address]   Pregnancy, urine POC     Status: None   Collection Time: 05/03/19 12:15 PM  Result Value Ref Range   Preg Test, Ur NEGATIVE NEGATIVE    Comment:        THE SENSITIVITY OF THIS METHODOLOGY IS >24 mIU/mL   Surgical pathology     Status: None   Collection Time: 05/03/19  1:53 PM  Result Value Ref Range   SURGICAL PATHOLOGY      Surgical Pathology CASE: [id number] PATIENT: Ayat Kingry Surgical Pathology Report     SPECIMEN SUBMITTED: A. Lung, right lower lobe  CLINICAL HISTORY: None provided  PRE-OPERATIVE DIAGNOSIS: None provided  POST-OPERATIVE DIAGNOSIS: None provided.    DIAGNOSIS: A. LUNG, RIGHT LOWER LOBE; FIBEROPTIC BIOPSY: - FRAGMENTS OF BENIGN BRONCHIAL WALL AND FOCAL DETACHED UNREMARKABLE BRONCHIAL EPITHELIAL CELLS. - NO INCREASE IN EOSINOPHILS (LESS THAN 1 PER HPF) - NEGATIVE FOR TUMOR AND GRANULOMA.  Comment: The patient's remote history of eosinophilic pneumonia is noted. Sections display portions of bronchial wall with thin submucosal smooth muscle bundles and unremarkable bronchial epithelial cells.  There is no increase in eosinophils or other inflammatory cells.  There are no fibroblastic foci, fibrosis, or other changes to suggest interstitial lung disease in the current sampling.  This case is reviewed with a concurrent cytospin cytology specimen . This specimen displays a mixed inflammatory cell component, with benign bronchial epithelial cells and pulmonary macrophages. A 200 cell differential count was performed, with the following results: Neutrophils - 65.5% Lymphocytes - 22.0% Monocytes/macrophages - 7.5% Eosinophils - 5.0%  Clinical and radiographic correlation is  recommended.  GROSS DESCRIPTION: A. Site: Right lower lobe lung Procedure: Bronch Cytotechnologist: Molli Barrows, Ashlee Howze-Soremekun Specimen(s) collected: 2 diff Quik stained slides  Received: In formalin Tissue fragment(s): Several Size: 0.3 x 0.2 x 0.1 cm Description: Pink-red tissue fragments Entirely submitted in 1 cassette.      Final Diagnosis performed by Allena Napoleon, MD.   Electronically signed 05/04/2019 11:10:01AM The electronic signature indicates that the named Attending Pathologist has evaluated the specimen  Technical component performed at  [facility] [address], [place], [place] [address] Lab: [phone] Dir: Mercy [name], MD, MMM  Professional component performed at [facility], [facility], [place], [place], [place] [address] Lab: [phone] Dir: Dellia Nims. Rubinas, MD   Acid Fast Smear (AFB)     Status: None   Collection Time: 05/03/19  2:35 PM   Specimen: Bronchoalveolar Lavage  Result Value Ref Range   AFB Specimen Processing Concentration    Acid Fast Smear Negative     Comment: (NOTE) Performed At: [facility] [address] [place], [place] [id number] Rush Farmer MD [id number]    Source (AFB) BRONCHIAL ALVEOLAR LAVAGE     Comment: Performed at [facility], [place] [place], [place] [address]  Fungus Culture With Stain     Status: Abnormal   Collection Time: 05/03/19  2:35 PM  Result Value Ref Range   Fungus Stain Final report    Fungus (Mycology) Culture Final report (A)     Comment: (NOTE) Performed At: [facility] [address] [place], [place] [id number] Rush Farmer MD [id number] CORRECTED ON 08/13 AT 5726: PREVIOUSLY REPORTED AS Preliminary report    Fungal Source BRONCHIAL ALVEOLAR LAVAGE     Comment: Performed at [facility], [place] [place], [place] [address]  Culture, bal-quantitative     Status: Abnormal   Collection Time: 05/03/19   2:35 PM   Specimen: Bronchoalveolar Lavage  Result Value Ref Range   Specimen Description      BRONCHIAL ALVEOLAR LAVAGE Performed at [facility], [address], [place], [place] [address]    Special Requests      NONE Performed at [facility], [place], [place] [address]    Gram Stain      RARE WBC PRESENT, PREDOMINANTLY PMN RARE GRAM POSITIVE COCCI    Culture (A)     80,000 COLONIES/mL Consistent with normal respiratory flora. Performed at [facility], [place] [address], [place], [place] [address]    Report Status 05/05/2019 FINAL   Fungus Culture Result     Status: None   Collection Time: 05/03/19  2:35 PM  Result Value Ref Range   Result 1 Comment     Comment: (NOTE) KOH/Calcofluor preparation:  no fungus observed. Performed At: [facility] [place], [place] [id number] Rush Farmer MD OI:[id number]   Fungal organism reflex     Status: Abnormal   Collection Time: 05/03/19  2:35 PM  Result Value Ref Range   Fungal result 1 Candida albicans (A)     Comment: (NOTE) 1-2 colonies                                            . Performed At: [facility] [place], [place] [id number] Rush Farmer MD [id number]     Radiology: Dg Chest Port 1 View  Result Date: 05/03/2019 CLINICAL DATA:  Status post bronchoscopy. EXAM: PORTABLE CHEST 1 VIEW COMPARISON:  Chest x-rays dated 02/22/2019 and 02/03/2019. FINDINGS: Stable chronic scarring/atelectasis at the RIGHT lung base. Lungs otherwise clear. No pleural effusion or pneumothorax seen. Osseous structures about the chest are unremarkable. IMPRESSION: No evidence of acute cardiopulmonary abnormality. No pneumothorax seen. Electronically Signed   By: Franki Cabot M.D.   On: 05/03/2019 14:16   Dg C-arm 1-60 Min-no Report  Result Date:  05/03/2019 Fluoroscopy was utilized by the requesting physician.  No radiographic interpretation.    No  results found.  No results found.    Assessment and Plan: Patient Active Problem List   Diagnosis Date Noted  . Routine general medical examination at a health care facility 05/08/2019  . Lower respiratory infection 01/26/2019  . Lumbar radicular pain 12/21/2017  . Advance care planning 05/04/2017  . Calculus of gallbladder without cholecystitis without obstruction 08/05/2016  . Migraine without aura 04/18/2012  . Cough 02/06/2012  . Eosinophilic pneumonia ([place]) 83/81/8403  . Hypothyroidism 02/07/2011    1. Eosinophilic Pneumonia patient is on therapy with steroids she had a very good response last time when she had any flareup.  I explained to her that the presence of groundglass opacities currently is indicative of a good response to steroids.  We will need to do a follow-up imaging and a will get her scheduled for a CT scan of the chest in 2 weeks without contrast.  I will decrease the prednisone to 5 mg daily new prescription was given 2. Bronchiectasis without exacerbations she is at baseline no increase in cough or sputum production.  Spoke with her regarding this and explained to her that the bronchiectasis is not going to go away but we do need to be careful as far as having flareups of bacterial superinfection. 3. SOB slow to improve we will continue to monitor.  Hopefully with ongoing steroid therapy we will have an improvement 4. Seasonal Allergic rhinitis stable at this time no flareup or exacerbation noted.  General Counseling: I have discussed the findings of the evaluation and examination with Anderson Malta.  I have also discussed any further diagnostic evaluation thatmay be needed or ordered today. Koriana verbalizes understanding of the findings of todays visit. We also reviewed her medications today and discussed drug interactions and side effects including but not limited excessive drowsiness and altered mental states. We also discussed that there is always a risk not just to  her but also people around her. she has been encouraged to call the office with any questions or concerns that should arise related to todays visit.    Time spent: 25 minutes  I have personally obtained a history, examined the patient, evaluated laboratory and imaging results, formulated the assessment and plan and placed orders.    Allyne Gee, MD [facility] Pulmonary and Critical Care Sleep medicine

## 2019-06-15 LAB — ACID FAST CULTURE WITH REFLEXED SENSITIVITIES (MYCOBACTERIA): Acid Fast Culture: NEGATIVE

## 2019-06-22 ENCOUNTER — Telehealth: Payer: Self-pay

## 2019-06-22 ENCOUNTER — Other Ambulatory Visit: Payer: Self-pay

## 2019-06-22 DIAGNOSIS — J479 Bronchiectasis, uncomplicated: Secondary | ICD-10-CM

## 2019-06-22 MED ORDER — PREDNISONE 20 MG PO TABS: ORAL_TABLET | ORAL | 2 refills | Status: DC

## 2019-06-22 MED ORDER — SULFAMETHOXAZOLE-TRIMETHOPRIM 800-160 MG PO TABS: ORAL_TABLET | ORAL | 4 refills | Status: DC

## 2019-06-22 NOTE — Telephone Encounter (Signed)
Pt advised we send bactrim and prednisone to [facility]

## 2019-06-22 NOTE — Telephone Encounter (Signed)
lmom to call us back

## 2019-06-28 ENCOUNTER — Ambulatory Visit
Admission: RE | Admit: 2019-06-28 | Discharge: 2019-06-28 | Disposition: A | Discharge status: Home / Self Care | Payer: 59 | Source: Ambulatory Visit | Attending: Internal Medicine | Admitting: Internal Medicine

## 2019-06-28 ENCOUNTER — Other Ambulatory Visit: Payer: Self-pay

## 2019-06-28 DIAGNOSIS — R0602 Shortness of breath: Secondary | ICD-10-CM

## 2019-07-04 ENCOUNTER — Encounter: Payer: Self-pay | Admitting: Anesthesiology

## 2019-07-12 ENCOUNTER — Other Ambulatory Visit: Payer: Self-pay

## 2019-07-12 ENCOUNTER — Encounter: Payer: Self-pay | Admitting: Internal Medicine

## 2019-07-12 ENCOUNTER — Ambulatory Visit: Payer: 59 | Admitting: Internal Medicine

## 2019-07-12 VITALS — BP 122/84 | HR 91 | Resp 16 | Ht 62.0 in | Wt 204.0 lb

## 2019-07-12 DIAGNOSIS — J82 Pulmonary eosinophilia, not elsewhere classified: Secondary | ICD-10-CM

## 2019-07-12 DIAGNOSIS — J301 Allergic rhinitis due to pollen: Secondary | ICD-10-CM

## 2019-07-12 DIAGNOSIS — J479 Bronchiectasis, uncomplicated: Secondary | ICD-10-CM

## 2019-07-12 DIAGNOSIS — R0602 Shortness of breath: Secondary | ICD-10-CM

## 2019-07-12 DIAGNOSIS — J84112 Idiopathic pulmonary fibrosis: Secondary | ICD-10-CM

## 2019-07-12 DIAGNOSIS — J8281 Chronic eosinophilic pneumonia: Secondary | ICD-10-CM

## 2019-07-12 NOTE — Progress Notes (Signed)
[facility] [place], [place] [address]  Pulmonary Sleep Medicine   Office Visit Note  Patient Name: [name] DOB: 1980-11-16 MRN [id number]  Date of Service: 07/12/2019  Complaints/HPI: Patient is here for follow-up of chronic eosinophilic pneumonia.  He had a CT scan done on September 8 the CT scan shows some improvement but not dramatic like she had when she first was diagnosed with several years ago.  She does of course have chronic lower lobe fibrotic and surgical changes along with bronchiectasis.  She states that she is not having any shortness of breath although she has noted little bit more shortness of breath perhaps when she decreased the steroids down to 40 mg.  She currently is on 40 mg.  I personally reviewed CT scan with her and showed her the previous scan from July as well as the most recent scan from September.  In my opinion I feel like there is slight improvement in the groundglass opacities which are more prominent in the lower zones.  There is also still some GGO's present along with areas of bronchiectasis which I do not expect to go away and I explained this to her and she understands.   ROS  General: (-) fever, (-) chills, (-) night sweats, (-) weakness Skin: (-) rashes, (-) itching,. Eyes: (-) visual changes, (-) redness, (-) itching. Nose and Sinuses: (-) nasal stuffiness or itchiness, (-) postnasal drip, (-) nosebleeds, (-) sinus trouble. Mouth and Throat: (-) sore throat, (-) hoarseness. Neck: (-) swollen glands, (-) enlarged thyroid, (-) neck pain. Respiratory: - cough, (-) bloody sputum, - shortness of breath, - wheezing. Cardiovascular: - ankle swelling, (-) chest pain. Lymphatic: (-) lymph node enlargement. Neurologic: (-) numbness, (-) tingling. Psychiatric: (-) anxiety, (-) depression   Current Medication: Outpatient Encounter Medications as of 07/12/2019  Medication Sig  . albuterol (PROVENTIL HFA;VENTOLIN HFA)  108 (90 Base) MCG/ACT inhaler Inhale 2 puffs into the lungs every 4 (four) hours as needed for wheezing or shortness of breath.  . levothyroxine (SYNTHROID) 125 MCG tablet TAKE 1 TABLET BY MOUTH ONCE DAILY EXCEPT TAKE 1 AND 1/2 TABLETS ON SUNDAYS  . predniSONE (DELTASONE) 20 MG tablet Take 2 tab po daily  . sulfamethoxazole-trimethoprim (BACTRIM DS) 800-160 MG tablet Take 1 tab po Monday,wednesday  and Friday   No facility-administered encounter medications on file as of 07/12/2019.     Surgical History: Past Surgical History:  Procedure Laterality Date  . CESAREAN SECTION  2007  . CHOLECYSTECTOMY N/A 10/27/2016   Procedure: LAPAROSCOPIC CHOLECYSTECTOMY;  Surgeon: Jules Husbands, MD;  Location: [facility] ORS;  Service: General;  Laterality: N/A;  . DIAGNOSTIC LAPAROSCOPY  2018   X2  . FLEXIBLE BRONCHOSCOPY N/A 05/03/2019   Procedure: FLEXIBLE BRONCHOSCOPY;  Surgeon: Allyne Gee, MD;  Location: [facility] ORS;  Service: Pulmonary;  Laterality: N/A;  . LUNG BIOPSY Right 2008   partial right lung resection.  was treated with steroids x 1 year  . TONSILLECTOMY AND ADENOIDECTOMY      Medical History: Past Medical History:  Diagnosis Date  . Anemia    Iron Deficiency in the past  . Asthma    in childhood  . Bowen's disease   . Complication of anesthesia    HARD TO WAKE UP SOMETIIMES  . Dyspnea 04/2019   sob and coughing has increased of late  . Endometriosis    dx'd in high school  . Eosinophilic pneumonia ([place]) ~54/0086   per Dr. Clayborn Bigness s/p  VATS and 1 year of prednisone  . GERD (gastroesophageal reflux disease)    TUMS PRN  . Headache    MIGRAINES without aura  . History of abnormal Pap smear   . History of chicken pox   . Hypothyroidism    dx'd 2011  . Pneumonia 2009/ 5852   EOSINOPHILIC PNEUMONIA  . PONV (postoperative nausea and vomiting)   . Seizures ([place])    childhood-previously on Tegretol-been off meds for years with no seizures  . Urinary tract bacterial infections      Family History: Family History  Problem Relation Age of Onset  . Mental illness Mother        bipolar  . Cancer Father        h/o throat cancer- treated  . Heart disease Maternal Grandfather        at ate >60  . Alcohol abuse Maternal Grandfather   . Stomach cancer Neg Hx   . Rectal cancer Neg Hx   . Esophageal cancer Neg Hx   . Colon cancer Neg Hx   . Breast cancer Neg Hx     Social History: Social History   Socioeconomic History  . Marital status: Married    Spouse name: cory  . Number of children: 2  . Years of education: Not on file  . Highest education level: Not on file  Occupational History  . Occupation: Passenger transport manager    Comment: currently working  Social Needs  . Financial resource strain: Not on file  . Food insecurity    Worry: Not on file    Inability: Not on file  . Transportation needs    Medical: Not on file    Non-medical: Not on file  Tobacco Use  . Smoking status: Former Smoker    Packs/day: 0.50    Years: 10.00    Pack years: 5.00    Types: Cigarettes    Quit date: 10/21/2004    Years since quitting: 14.7  . Smokeless tobacco: Never Used  Substance and Sexual Activity  . Alcohol use: Yes    Comment: rare  . Drug use: No  . Sexual activity: Yes    Partners: Male    Birth control/protection: Condom  Lifestyle  . Physical activity    Days per week: Not on file    Minutes per session: Not on file  . Stress: Not on file  Relationships  . Social Herbalist on phone: Not on file    Gets together: Not on file    Attends religious service: Not on file    Active member of club or organization: Not on file    Attends meetings of clubs or organizations: Not on file    Relationship status: Not on file  . Intimate partner violence    Fear of current or ex partner: Not on file    Emotionally abused: Not on file    Physically abused: Not on file    Forced sexual activity: Not on file  Other Topics Concern  . Not on file   Social History Narrative   Caffeine use:  Yes   Regular exercise:  Yes   2 pregnancies = 2 live births, 1 son and 1 daughter   From [place], in [place] since 2006    Vital Signs: Blood pressure 122/84, pulse 91, resp. rate 16, height _0  (1.575 m), weight 204 lb (92.5 kg), SpO2 99 %.  Examination: General Appearance: The patient is well-developed, well-nourished, and in  no distress. Skin: Gross inspection of skin unremarkable. Head: normocephalic, no gross deformities. Eyes: no gross deformities noted. ENT: ears appear grossly normal no exudates. Neck: Supple. No thyromegaly. No LAD. Respiratory: no rhonchi noted. Cardiovascular: Normal S1 and S2 without murmur or rub. Extremities: No cyanosis. pulses are equal. Neurologic: Alert and oriented. No involuntary movements.  LABS: Recent Results (from the past 2160 hour(s))  Basic metabolic panel     Status: None   Collection Time: 04/28/19  8:16 AM  Result Value Ref Range   Sodium 138 135 - 145 mEq/L   Potassium 4.3 3.5 - 5.1 mEq/L   Chloride 104 96 - 112 mEq/L   CO2 28 19 - 32 mEq/L   Glucose, Bld 96 70 - 99 mg/dL   BUN 8 6 - 23 mg/dL   Creatinine, Ser 0.73 0.40 - 1.20 mg/dL   Calcium 9.2 8.4 - 10.5 mg/dL   GFR 90.17 >60.00 mL/min  CBC with Differential/Platelet     Status: None   Collection Time: 04/28/19  8:16 AM  Result Value Ref Range   WBC 7.3 4.0 - 10.5 K/uL   RBC 4.52 3.87 - 5.11 Mil/uL   Hemoglobin 12.4 12.0 - 15.0 g/dL   HCT 37.6 36.0 - 46.0 %   MCV 83.3 78.0 - 100.0 fl   MCHC 33.0 30.0 - 36.0 g/dL   RDW 15.0 11.5 - 15.5 %   Platelets 276.0 150.0 - 400.0 K/uL   Neutrophils Relative % 70.9 43.0 - 77.0 %   Lymphocytes Relative 17.1 12.0 - 46.0 %   Monocytes Relative 7.3 3.0 - 12.0 %   Eosinophils Relative 4.3 0.0 - 5.0 %   Basophils Relative 0.4 0.0 - 3.0 %   Neutro Abs 5.2 1.4 - 7.7 K/uL   Lymphs Abs 1.3 0.7 - 4.0 K/uL   Monocytes Absolute 0.5 0.1 - 1.0 K/uL   Eosinophils Absolute 0.3 0.0 - 0.7 K/uL    Basophils Absolute 0.0 0.0 - 0.1 K/uL  TSH     Status: Abnormal   Collection Time: 04/28/19  8:16 AM  Result Value Ref Range   TSH 4.56 (H) 0.35 - 4.50 uIU/mL  Lipid panel     Status: None   Collection Time: 04/28/19  8:16 AM  Result Value Ref Range   Cholesterol 148 0 - 200 mg/dL    Comment: ATP III Classification       Desirable:  < 200 mg/dL               Borderline High:  200 - 239 mg/dL          High:  > = 240 mg/dL   Triglycerides 149.0 0.0 - 149.0 mg/dL    Comment: Normal:  <150 mg/dLBorderline High:  150 - 199 mg/dL   HDL 42.50 >39.00 mg/dL   VLDL 29.8 0.0 - 40.0 mg/dL   LDL Cholesterol 76 0 - 99 mg/dL   Total CHOL/HDL Ratio 3     Comment:                Men          Women1/2 Average Risk     3.4          3.3Average Risk          5.0          4.42X Average Risk          9.6          7.13X Average Risk  15.0          11.0                       NonHDL 105.83     Comment: NOTE:  Non-HDL goal should be 30 mg/dL higher than patient's LDL goal (i.e. LDL goal of < 70 mg/dL, would have non-HDL goal of < 100 mg/dL)  SARS Coronavirus 2 (Performed in [place] hospital lab)     Status: None   Collection Time: 04/29/19  1:19 PM   Specimen: Nasal Swab  Result Value Ref Range   SARS Coronavirus 2 NEGATIVE NEGATIVE    Comment: (NOTE) SARS-CoV-2 target nucleic acids are NOT DETECTED. The SARS-CoV-2 RNA is generally detectable in upper and lower respiratory specimens during the acute phase of infection. Negative results do not preclude SARS-CoV-2 infection, do not rule out co-infections with other pathogens, and should not be used as the sole basis for treatment or other patient management decisions. Negative results must be combined with clinical observations, patient history, and epidemiological information. The expected result is Negative. Fact Sheet for Patients: SugarRoll.be Fact Sheet for Healthcare  Providers: https://www.woods-mathews.com/ This test is not yet approved or cleared by the [place] FDA and  has been authorized for detection and/or diagnosis of SARS-CoV-2 by FDA under an Emergency Use Authorization (EUA). This EUA will remain  in effect (meaning this test can be used) for the duration of the COVID-19 declaration under Section 56 4(b)(1) of the Act, 21 U.S.C. section 360bbb-3(b)(1), unless the authorization is terminated or revoked sooner. Performed at [facility], [place] [address] [place], [place] [address]   Pregnancy, urine POC     Status: None   Collection Time: 05/03/19 12:15 PM  Result Value Ref Range   Preg Test, Ur NEGATIVE NEGATIVE    Comment:        THE SENSITIVITY OF THIS METHODOLOGY IS >24 mIU/mL   Surgical pathology     Status: None   Collection Time: 05/03/19  1:53 PM  Result Value Ref Range   SURGICAL PATHOLOGY      Surgical Pathology CASE: [id number] PATIENT: Arrionna Tison Surgical Pathology Report     SPECIMEN SUBMITTED: A. Lung, right lower lobe  CLINICAL HISTORY: None provided  PRE-OPERATIVE DIAGNOSIS: None provided  POST-OPERATIVE DIAGNOSIS: None provided.    DIAGNOSIS: A. LUNG, RIGHT LOWER LOBE; FIBEROPTIC BIOPSY: - FRAGMENTS OF BENIGN BRONCHIAL WALL AND FOCAL DETACHED UNREMARKABLE BRONCHIAL EPITHELIAL CELLS. - NO INCREASE IN EOSINOPHILS (LESS THAN 1 PER HPF) - NEGATIVE FOR TUMOR AND GRANULOMA.  Comment: The patient's remote history of eosinophilic pneumonia is noted. Sections display portions of bronchial wall with thin submucosal smooth muscle bundles and unremarkable bronchial epithelial cells.  There is no increase in eosinophils or other inflammatory cells.  There are no fibroblastic foci, fibrosis, or other changes to suggest interstitial lung disease in the current sampling.  This case is reviewed with a concurrent cytospin cytology specimen . This specimen displays a mixed  inflammatory cell component, with benign bronchial epithelial cells and pulmonary macrophages. A 200 cell differential count was performed, with the following results: Neutrophils - 65.5% Lymphocytes - 22.0% Monocytes/macrophages - 7.5% Eosinophils - 5.0%  Clinical and radiographic correlation is recommended.  GROSS DESCRIPTION: A. Site: Right lower lobe lung Procedure: Bronch Cytotechnologist: Molli Barrows, Ashlee Howze-Soremekun Specimen(s) collected: 2 diff Quik stained slides  Received: In formalin Tissue fragment(s): Several Size: 0.3 x 0.2 x 0.1 cm Description: Pink-red tissue fragments Entirely  submitted in 1 cassette.      Final Diagnosis performed by Allena Napoleon, MD.   Electronically signed 05/04/2019 11:10:01AM The electronic signature indicates that the named Attending Pathologist has evaluated the specimen  Technical component performed at [facility], [address], [place], [place] [address] Lab: [phone] Dir: Mercy [name], MD, MMM  Professional component performed at [facility], [facility], [place], [place], [place] [address] Lab: [phone] Dir: Dellia Nims. Rubinas, MD   Acid Fast Smear (AFB)     Status: None   Collection Time: 05/03/19  2:35 PM   Specimen: Bronchoalveolar Lavage  Result Value Ref Range   AFB Specimen Processing Concentration    Acid Fast Smear Negative     Comment: (NOTE) Performed At: [facility] [address] [place], [place] [id number] Rush Farmer MD [id number]    Source (AFB) BRONCHIAL ALVEOLAR LAVAGE     Comment: Performed at [facility], [address] [place], [place] [address]  Acid Fast Culture with reflexed sensitivities     Status: None   Collection Time: 05/03/19  2:35 PM   Specimen: Bronchoalveolar Lavage  Result Value Ref Range   Acid Fast Culture Negative     Comment: (NOTE) No acid fast bacilli isolated after 6 weeks. Performed At: [facility] [place], [place] [id number] Rush Farmer MD OT:[id number]    Source of Sample BRONCHIAL ALVEOLAR LAVAGE     Comment: Performed at [facility], [address] [place], [place] [address]  Fungus Culture With Stain     Status: Abnormal   Collection Time: 05/03/19  2:35 PM  Result Value Ref Range   Fungus Stain Final report    Fungus (Mycology) Culture Final report (A)     Comment: (NOTE) Performed At: [facility] [address] [place], [place] [id number] Rush Farmer MD EH:[id number] CORRECTED ON 08/13 AT 3704: PREVIOUSLY REPORTED AS Preliminary report    Fungal Source BRONCHIAL ALVEOLAR LAVAGE     Comment: Performed at [facility], [address] [place], [place] [address]  Culture, bal-quantitative     Status: Abnormal   Collection Time: 05/03/19  2:35 PM   Specimen: Bronchoalveolar Lavage  Result Value Ref Range   Specimen Description      BRONCHIAL ALVEOLAR LAVAGE Performed at [facility], [address] [place], [place] [address]    Special Requests      NONE Performed at [facility], [address] [place], [place] [address]    Gram Stain      RARE WBC PRESENT, PREDOMINANTLY PMN RARE GRAM POSITIVE COCCI    Culture (A)     80,000 COLONIES/mL Consistent with normal respiratory flora. Performed at [facility], [place] [address] [place], [place] [address]    Report Status 05/05/2019 FINAL   Fungus Culture Result     Status: None   Collection Time: 05/03/19  2:35 PM  Result Value Ref Range   Result 1 Comment     Comment: (NOTE) KOH/Calcofluor preparation:  no fungus observed. Performed At: [facility] [place], [place] [id number] Rush Farmer MD [id number]   Fungal organism reflex     Status: Abnormal   Collection Time: 05/03/19  2:35 PM  Result Value Ref Range   Fungal result 1 Candida albicans (A)     Comment: (NOTE) 1-2 colonies                                             .  Performed At: [facility] [place], [place] [id number] Rush Farmer MD KG:[id number]   Pulmonary Function Test     Status: None   Collection Time: 05/18/19 10:00 AM  Result Value Ref Range   FEV1     FVC     FEV1/FVC     TLC     DLCO      Radiology: Ct Chest Wo Contrast  Result Date: 06/29/2019 CLINICAL DATA:  Shortness of breath, follow-up eosinophilic pneumonia EXAM: CT CHEST WITHOUT CONTRAST TECHNIQUE: Multidetector CT imaging of the chest was performed following the standard protocol without IV contrast. COMPARISON:  04/21/2019, 09/27/2012 FINDINGS: Cardiovascular: No significant vascular findings. Normal heart size. No pericardial effusion. Mediastinum/Nodes: No enlarged mediastinal, hilar, or axillary lymph nodes. Thyroid gland, trachea, and esophagus demonstrate no significant findings. Lungs/Pleura: No significant interval change in bibasilar predominant fibrotic scarring, bronchiectasis, volume loss, and ground-glass opacity, right greater than left, with prior right lung wedge biopsies. No pleural effusion or pneumothorax. Upper Abdomen: No acute abnormality. Musculoskeletal: No chest wall mass or suspicious bone lesions identified. IMPRESSION: No significant interval change in bibasilar predominant fibrotic scarring, bronchiectasis, volume loss, and ground-glass opacity, right greater than left, with prior right lung wedge biopsies. These findings have however worsened over time in comparison to more remote examination dated 2013. By report, patient has biopsy diagnosis of chronic eosinophilic pneumonia. There is no acutely superimposed airspace disease. Electronically Signed   By: Eddie Candle M.D.   On: 06/29/2019 08:26    No results found.  Ct Chest Wo Contrast  Result Date: 06/29/2019 CLINICAL DATA:  Shortness of breath, follow-up eosinophilic pneumonia EXAM: CT CHEST WITHOUT CONTRAST TECHNIQUE: Multidetector CT imaging of the chest was  performed following the standard protocol without IV contrast. COMPARISON:  04/21/2019, 09/27/2012 FINDINGS: Cardiovascular: No significant vascular findings. Normal heart size. No pericardial effusion. Mediastinum/Nodes: No enlarged mediastinal, hilar, or axillary lymph nodes. Thyroid gland, trachea, and esophagus demonstrate no significant findings. Lungs/Pleura: No significant interval change in bibasilar predominant fibrotic scarring, bronchiectasis, volume loss, and ground-glass opacity, right greater than left, with prior right lung wedge biopsies. No pleural effusion or pneumothorax. Upper Abdomen: No acute abnormality. Musculoskeletal: No chest wall mass or suspicious bone lesions identified. IMPRESSION: No significant interval change in bibasilar predominant fibrotic scarring, bronchiectasis, volume loss, and ground-glass opacity, right greater than left, with prior right lung wedge biopsies. These findings have however worsened over time in comparison to more remote examination dated 2013. By report, patient has biopsy diagnosis of chronic eosinophilic pneumonia. There is no acutely superimposed airspace disease. Electronically Signed   By: Eddie Candle M.D.   On: 06/29/2019 08:26      Assessment and Plan: Patient Active Problem List   Diagnosis Date Noted  . Routine general medical examination at a health care facility 05/08/2019  . Lower respiratory infection 01/26/2019  . Lumbar radicular pain 12/21/2017  . Advance care planning 05/04/2017  . Calculus of gallbladder without cholecystitis without obstruction 08/05/2016  . Migraine without aura 04/18/2012  . Cough 02/06/2012  . Eosinophilic pneumonia ([place]) 02/63/7858  . Hypothyroidism 02/07/2011    1. Chronic eosinophilic pneumonia the patient is on steroids will cut back to 40 mg I am going to see how she does over the next month further on the steroids I have already discussed with her that if she does not show improvement then I  would consider placing her on second line therapy.  She is agreeable to this plan at this time.  In addition the bronchoscopy report had grown Candida 2. Bronchiectasis appears to be stable at this time we will continue with supportive care she does not have any increase in cough or sputum production.  We will continue to monitor closely. 3. SOB this is also improved we will continue with supportive care patient overall says that she is improving although not as dramatically as she improved from when he last had the flareup several years ago. 4. Seasonal Allergic rhinitis this is under control we will continue with present management  General Counseling: I have discussed the findings of the evaluation and examination with Anderson Malta.  I have also discussed any further diagnostic evaluation thatmay be needed or ordered today. Beverly verbalizes understanding of the findings of todays visit. We also reviewed her medications today and discussed drug interactions and side effects including but not limited excessive drowsiness and altered mental states. We also discussed that there is always a risk not just to her but also people around her. she has been encouraged to call the office with any questions or concerns that should arise related to todays visit.    Time spent: 25 minutes  I have personally obtained a history, examined the patient, evaluated laboratory and imaging results, formulated the assessment and plan and placed orders.    Allyne Gee, MD [facility] Pulmonary and Critical Care Sleep medicine

## 2019-08-11 ENCOUNTER — Other Ambulatory Visit: Payer: Self-pay

## 2019-08-11 ENCOUNTER — Encounter: Payer: Self-pay | Admitting: Internal Medicine

## 2019-08-11 ENCOUNTER — Ambulatory Visit: Payer: 59 | Admitting: Internal Medicine

## 2019-08-11 VITALS — BP 120/80 | HR 83 | Temp 97.5°F | Resp 16 | Ht 62.0 in | Wt 205.0 lb

## 2019-08-11 DIAGNOSIS — R0602 Shortness of breath: Secondary | ICD-10-CM | POA: Diagnosis not present

## 2019-08-11 DIAGNOSIS — J8281 Chronic eosinophilic pneumonia: Secondary | ICD-10-CM

## 2019-08-11 DIAGNOSIS — J22 Unspecified acute lower respiratory infection: Secondary | ICD-10-CM | POA: Diagnosis not present

## 2019-08-11 DIAGNOSIS — J84112 Idiopathic pulmonary fibrosis: Secondary | ICD-10-CM | POA: Diagnosis not present

## 2019-08-11 MED ORDER — AZITHROMYCIN 250 MG PO TABS: ORAL_TABLET | ORAL | 0 refills | Status: DC

## 2019-08-11 MED ORDER — PREDNISONE 5 MG PO TABS: ORAL_TABLET | ORAL | 4 refills | Status: DC

## 2019-08-11 NOTE — Patient Instructions (Signed)
Azithromycin tablets What is this medicine? AZITHROMYCIN (az ith roe MYE sin) is a macrolide antibiotic. It is used to treat or prevent certain kinds of bacterial infections. It will not work for colds, flu, or other viral infections. This medicine may be used for other purposes; ask your health care provider or pharmacist if you have questions. COMMON BRAND NAME(S): Zithromax, Zithromax Tri-Pak, Zithromax Z-Pak What should I tell my health care provider before I take this medicine? They need to know if you have any of these conditions:  history of blood diseases, like leukemia  history of irregular heartbeat  kidney disease  liver disease  myasthenia gravis  an unusual or allergic reaction to azithromycin, erythromycin, other macrolide antibiotics, foods, dyes, or preservatives  pregnant or trying to get pregnant  breast-feeding How should I use this medicine? Take this medicine by mouth with a full glass of water. Follow the directions on the prescription label. The tablets can be taken with food or on an empty stomach. If the medicine upsets your stomach, take it with food. Take your medicine at regular intervals. Do not take your medicine more often than directed. Take all of your medicine as directed even if you think your are better. Do not skip doses or stop your medicine early. Talk to your pediatrician regarding the use of this medicine in children. While this drug may be prescribed for children as young as 6 months for selected conditions, precautions do apply. Overdosage: If you think you have taken too much of this medicine contact a poison control center or emergency room at once. NOTE: This medicine is only for you. Do not share this medicine with others. What if I miss a dose? If you miss a dose, take it as soon as you can. If it is almost time for your next dose, take only that dose. Do not take double or extra doses. What may interact with this medicine? Do not take  this medicine with any of the following medications:  cisapride  dronedarone  pimozide  thioridazine This medicine may also interact with the following medications:  antacids that contain aluminum or magnesium  birth control pills  colchicine  cyclosporine  digoxin  ergot alkaloids like dihydroergotamine, ergotamine  nelfinavir  other medicines that prolong the QT interval (an abnormal heart rhythm)  phenytoin  warfarin This list may not describe all possible interactions. Give your health care provider a list of all the medicines, herbs, non-prescription drugs, or dietary supplements you use. Also tell them if you smoke, drink alcohol, or use illegal drugs. Some items may interact with your medicine. What should I watch for while using this medicine? Tell your doctor or healthcare provider if your symptoms do not start to get better or if they get worse. This medicine may cause serious skin reactions. They can happen weeks to months after starting the medicine. Contact your healthcare provider right away if you notice fevers or flu-like symptoms with a rash. The rash may be red or purple and then turn into blisters or peeling of the skin. Or, you might notice a red rash with swelling of the face, lips or lymph nodes in your neck or under your arms. Do not treat diarrhea with over the counter products. Contact your doctor if you have diarrhea that lasts more than 2 days or if it is severe and watery. This medicine can make you more sensitive to the sun. Keep out of the sun. If you cannot avoid being in the   sun, wear protective clothing and use sunscreen. Do not use sun lamps or tanning beds/booths. What side effects may I notice from receiving this medicine? Side effects that you should report to your doctor or health care professional as soon as possible:  allergic reactions like skin rash, itching or hives, swelling of the face, lips, or tongue  bloody or watery  diarrhea  breathing problems  chest pain  fast, irregular heartbeat  muscle weakness  rash, fever, and swollen lymph nodes  redness, blistering, peeling, or loosening of the skin, including inside the mouth  signs and symptoms of liver injury like dark yellow or brown urine; general ill feeling or flu-like symptoms; light-colored stools; loss of appetite; nausea; right upper belly pain; unusually weak or tired; yellowing of the eyes or skin  white patches or sores in the mouth  unusually weak or tired Side effects that usually do not require medical attention (report to your doctor or health care professional if they continue or are bothersome):  diarrhea  nausea  stomach pain  vomiting This list may not describe all possible side effects. Call your doctor for medical advice about side effects. You may report side effects to FDA at 1-800-FDA-1088. Where should I keep my medicine? Keep out of the reach of children. Store at room temperature between 15 and 30 degrees C (59 and 86 degrees F). Throw away any unused medicine after the expiration date. NOTE: This sheet is a summary. It may not cover all possible information. If you have questions about this medicine, talk to your doctor, pharmacist, or health care provider.  2020 Elsevier/Gold Standard (2019-01-13 17:19:20)  

## 2019-08-11 NOTE — Progress Notes (Signed)
[facility] [place], [place] [address]  Pulmonary Sleep Medicine   Office Visit Note  Patient Name: [name] DOB: 1981-06-19 MRN [id number]  Date of Service: 08/11/2019  Complaints/HPI: She states that she is doing fairly well.  Has not noticed any worsening of shortness of breath.  Patient is currently on 40 mg prednisone which we should be able to taper down to 35 mg.  She did have spirometry done this morning and it actually shows an improvement.  The patient denies any hemoptysis with cough.  She states she felt like she is having a lower respiratory tract infection she continues to take the Bactrim as prescribed.  She had no fever noted.  Does note a little bit of sputum though.  She has noted a little bit of weight gain also since the last visit.  ROS  General: (-) fever, (-) chills, (-) night sweats, (-) weakness Skin: (-) rashes, (-) itching,. Eyes: (-) visual changes, (-) redness, (-) itching. Nose and Sinuses: (-) nasal stuffiness or itchiness, (-) postnasal drip, (-) nosebleeds, (-) sinus trouble. Mouth and Throat: (-) sore throat, (-) hoarseness. Neck: (-) swollen glands, (-) enlarged thyroid, (-) neck pain. Respiratory: + cough, (-) bloody sputum, + shortness of breath, - wheezing. Cardiovascular: - ankle swelling, (-) chest pain. Lymphatic: (-) lymph node enlargement. Neurologic: (-) numbness, (-) tingling. Psychiatric: (-) anxiety, (-) depression   Current Medication: Outpatient Encounter Medications as of 08/11/2019  Medication Sig  . albuterol (PROVENTIL HFA;VENTOLIN HFA) 108 (90 Base) MCG/ACT inhaler Inhale 2 puffs into the lungs every 4 (four) hours as needed for wheezing or shortness of breath.  . levothyroxine (SYNTHROID) 125 MCG tablet TAKE 1 TABLET BY MOUTH ONCE DAILY EXCEPT TAKE 1 AND 1/2 TABLETS ON SUNDAYS  . predniSONE (DELTASONE) 20 MG tablet Take 2 tab po daily  . sulfamethoxazole-trimethoprim (BACTRIM DS) 800-160  MG tablet Take 1 tab po Monday,wednesday  and Friday   No facility-administered encounter medications on file as of 08/11/2019.     Surgical History: Past Surgical History:  Procedure Laterality Date  . CESAREAN SECTION  2007  . CHOLECYSTECTOMY N/A 10/27/2016   Procedure: LAPAROSCOPIC CHOLECYSTECTOMY;  Surgeon: Jules Husbands, MD;  Location: [facility] ORS;  Service: General;  Laterality: N/A;  . DIAGNOSTIC LAPAROSCOPY  2018   X2  . FLEXIBLE BRONCHOSCOPY N/A 05/03/2019   Procedure: FLEXIBLE BRONCHOSCOPY;  Surgeon: Allyne Gee, MD;  Location: [facility] ORS;  Service: Pulmonary;  Laterality: N/A;  . LUNG BIOPSY Right 2008   partial right lung resection.  was treated with steroids x 1 year  . TONSILLECTOMY AND ADENOIDECTOMY      Medical History: Past Medical History:  Diagnosis Date  . Anemia    Iron Deficiency in the past  . Asthma    in childhood  . Bowen's disease   . Complication of anesthesia    HARD TO WAKE UP SOMETIIMES  . Dyspnea 04/2019   sob and coughing has increased of late  . Endometriosis    dx'd in high school  . Eosinophilic pneumonia ~93/8182   per Dr. Clayborn Bigness s/p VATS and 1 year of prednisone  . GERD (gastroesophageal reflux disease)    TUMS PRN  . Headache    MIGRAINES without aura  . History of abnormal Pap smear   . History of chicken pox   . Hypothyroidism    dx'd 2011  . Pneumonia 2009/ 9937   EOSINOPHILIC PNEUMONIA  . PONV (postoperative nausea and  vomiting)   . Seizures ([place])    childhood-previously on Tegretol-been off meds for years with no seizures  . Urinary tract bacterial infections     Family History: Family History  Problem Relation Age of Onset  . Mental illness Mother        bipolar  . Cancer Father        h/o throat cancer- treated  . Heart disease Maternal Grandfather        at ate >60  . Alcohol abuse Maternal Grandfather   . Stomach cancer Neg Hx   . Rectal cancer Neg Hx   . Esophageal cancer Neg Hx   . Colon cancer Neg Hx    . Breast cancer Neg Hx     Social History: Social History   Socioeconomic History  . Marital status: Married    Spouse name: cory  . Number of children: 2  . Years of education: Not on file  . Highest education level: Not on file  Occupational History  . Occupation: Passenger transport manager    Comment: currently working  Social Needs  . Financial resource strain: Not on file  . Food insecurity    Worry: Not on file    Inability: Not on file  . Transportation needs    Medical: Not on file    Non-medical: Not on file  Tobacco Use  . Smoking status: Former Smoker    Packs/day: 0.50    Years: 10.00    Pack years: 5.00    Types: Cigarettes    Quit date: 10/21/2004    Years since quitting: 14.8  . Smokeless tobacco: Never Used  Substance and Sexual Activity  . Alcohol use: Yes    Comment: rare  . Drug use: No  . Sexual activity: Yes    Partners: Male    Birth control/protection: Condom  Lifestyle  . Physical activity    Days per week: Not on file    Minutes per session: Not on file  . Stress: Not on file  Relationships  . Social Herbalist on phone: Not on file    Gets together: Not on file    Attends religious service: Not on file    Active member of club or organization: Not on file    Attends meetings of clubs or organizations: Not on file    Relationship status: Not on file  . Intimate partner violence    Fear of current or ex partner: Not on file    Emotionally abused: Not on file    Physically abused: Not on file    Forced sexual activity: Not on file  Other Topics Concern  . Not on file  Social History Narrative   Caffeine use:  Yes   Regular exercise:  Yes   2 pregnancies = 2 live births, 1 son and 1 daughter   From [place], in [place] since 2006    Vital Signs: Blood pressure 120/80, pulse 83, temperature (!) 97.5 F (36.4 C), resp. rate 16, height _0  (1.575 m), weight 205 lb (93 kg), SpO2 98 %.  Examination: General Appearance: The patient is  well-developed, well-nourished, and in no distress. Skin: Gross inspection of skin unremarkable. Head: normocephalic, no gross deformities. Eyes: no gross deformities noted. ENT: ears appear grossly normal no exudates. Neck: Supple. No thyromegaly. No LAD. Respiratory: few rhonchi noted at this time. Cardiovascular: Normal S1 and S2 without murmur or rub. Extremities: No cyanosis. pulses are equal. Neurologic: Alert and oriented. No  involuntary movements.  LABS: Recent Results (from the past 2160 hour(s))  Pulmonary Function Test     Status: None   Collection Time: 05/18/19 10:00 AM  Result Value Ref Range   FEV1     FVC     FEV1/FVC     TLC     DLCO      Radiology: Ct Chest Wo Contrast  Result Date: 06/29/2019 CLINICAL DATA:  Shortness of breath, follow-up eosinophilic pneumonia EXAM: CT CHEST WITHOUT CONTRAST TECHNIQUE: Multidetector CT imaging of the chest was performed following the standard protocol without IV contrast. COMPARISON:  04/21/2019, 09/27/2012 FINDINGS: Cardiovascular: No significant vascular findings. Normal heart size. No pericardial effusion. Mediastinum/Nodes: No enlarged mediastinal, hilar, or axillary lymph nodes. Thyroid gland, trachea, and esophagus demonstrate no significant findings. Lungs/Pleura: No significant interval change in bibasilar predominant fibrotic scarring, bronchiectasis, volume loss, and ground-glass opacity, right greater than left, with prior right lung wedge biopsies. No pleural effusion or pneumothorax. Upper Abdomen: No acute abnormality. Musculoskeletal: No chest wall mass or suspicious bone lesions identified. IMPRESSION: No significant interval change in bibasilar predominant fibrotic scarring, bronchiectasis, volume loss, and ground-glass opacity, right greater than left, with prior right lung wedge biopsies. These findings have however worsened over time in comparison to more remote examination dated 2013. By report, patient has biopsy  diagnosis of chronic eosinophilic pneumonia. There is no acutely superimposed airspace disease. Electronically Signed   By: Eddie Candle M.D.   On: 06/29/2019 08:26    No results found.  No results found.    Assessment and Plan: Patient Active Problem List   Diagnosis Date Noted  . Routine general medical examination at a health care facility 05/08/2019  . Lower respiratory infection 01/26/2019  . Lumbar radicular pain 12/21/2017  . Advance care planning 05/04/2017  . Calculus of gallbladder without cholecystitis without obstruction 08/05/2016  . Migraine without aura 04/18/2012  . Cough 02/06/2012  . Eosinophilic pneumonia 02/54/2706  . Hypothyroidism 02/07/2011    1. Chronic eosinophilic pneumonia to continue with a steroids taper down to the 35 mg at this point.  She will need a follow-up imaging at some point we will wait about 2 months to recheck her CT scan. 2. Possible lower respiratory tract infection commended giving her some azithromycin. 3. Interstitial fibrosis as result of chronic eosinophilic pneumonia.  Patient will get a follow-up CT scan at some point. 4. Shortness of breath gradually improving 5. Chronic steroid therapy will taper down to 35 mg and monitor her response closely.  General Counseling: I have discussed the findings of the evaluation and examination with Anderson Malta.  I have also discussed any further diagnostic evaluation thatmay be needed or ordered today. Senia verbalizes understanding of the findings of todays visit. We also reviewed her medications today and discussed drug interactions and side effects including but not limited excessive drowsiness and altered mental states. We also discussed that there is always a risk not just to her but also people around her. she has been encouraged to call the office with any questions or concerns that should arise related to todays visit.    Time spent: 44mn  I have personally obtained a history, examined  the patient, evaluated laboratory and imaging results, formulated the assessment and plan and placed orders.    SAllyne Gee MD FAssencion St. Vincent'S Medical Center [facility] CountyPulmonary and [facility] Care Sleep medicine

## 2019-09-08 ENCOUNTER — Telehealth: Payer: Self-pay

## 2019-09-08 NOTE — Telephone Encounter (Signed)
Confirmed appointment with patient. klh °

## 2019-09-12 ENCOUNTER — Ambulatory Visit: Payer: 59 | Admitting: Internal Medicine

## 2019-09-12 ENCOUNTER — Encounter: Payer: Self-pay | Admitting: Internal Medicine

## 2019-09-12 ENCOUNTER — Other Ambulatory Visit: Payer: Self-pay

## 2019-09-12 VITALS — BP 102/76 | HR 77 | Temp 97.5°F | Resp 16 | Ht 62.0 in | Wt 211.0 lb

## 2019-09-12 DIAGNOSIS — Z23 Encounter for immunization: Secondary | ICD-10-CM

## 2019-09-12 DIAGNOSIS — R0602 Shortness of breath: Secondary | ICD-10-CM

## 2019-09-12 DIAGNOSIS — J8281 Chronic eosinophilic pneumonia: Secondary | ICD-10-CM

## 2019-09-12 DIAGNOSIS — J479 Bronchiectasis, uncomplicated: Secondary | ICD-10-CM

## 2019-09-12 DIAGNOSIS — J849 Interstitial pulmonary disease, unspecified: Secondary | ICD-10-CM | POA: Diagnosis not present

## 2019-09-12 NOTE — Patient Instructions (Signed)
CT Angiogram  A CT angiogram is a procedure to look at the blood vessels in various areas of the body. For this procedure, a large X-ray machine, called a CT scanner, takes detailed pictures of blood vessels that have been injected with a dye (contrast material). A CT angiogram allows your health care provider to see how well blood is flowing to the area of your body that is being checked. Your health care provider will be able to see if there are any problems, such as a blockage. Tell a health care provider about:  Any allergies you have.  All medicines you are taking, including vitamins, herbs, eye drops, creams, and over-the-counter medicines.  Any problems you or family members have had with anesthetic medicines.  Any blood disorders you have.  Any surgeries you have had.  Any medical conditions you have.  Whether you are pregnant or may be pregnant.  Whether you are breastfeeding.  Any anxiety disorders, chronic pain, or other conditions you have that may increase your stress or prevent you from lying still. What are the risks? Generally, this is a safe procedure. However, problems may occur, including:  Infection.  Bleeding.  Allergic reactions to medicines or dyes.  Damage to other structures or organs.  Kidney damage from the dye or contrast that is used.  Increased risk of cancer from radiation exposure. This risk is low. Talk with your health care provider about: ? The risks and benefits of testing. ? How you can receive the lowest dose of radiation. What happens before the procedure?  Wear comfortable clothing and remove any jewelry.  Follow instructions from your health care provider about eating and drinking. For most people, instructions may include these actions: ? For 12 hours before the test, avoid caffeine. This includes tea, coffee, soda, and energy drinks or pills. ? For 3-4 hours before the test, stop eating or drinking anything but water. ? Stay  well hydrated by continuing to drink water before the exam. This will help to clear the contrast dye from your body after the test.  Ask your health care provider about changing or stopping your regular medicines. This is especially important if you are taking diabetes medicines or blood thinners. What happens during the procedure?  An IV tube will be inserted into one of your veins.  You will be asked to lie on an exam table. This table will slide in and out of the CT machine during the procedure.  Contrast dye will be injected into the IV tube. You might feel warm, or you may get a metallic taste in your mouth.  The table that you are lying on will move into the CT machine tunnel for the scan.  The person running the machine will give you instructions while the scans are being done. You may be asked to: ? Keep your arms above your head. ? Hold your breath. ? Stay very still, even if the table is moving.  When the scanning is complete, you will be moved out of the machine.  The IV tube will be removed. The procedure may vary among health care providers and hospitals. What happens after the procedure?  You might feel warm, or you may get a metallic taste in your mouth.  You may be asked to drink water or other fluids to wash (flush) the contrast material out of your body.  It is up to you to get the results of your procedure. Ask your health care provider, or the department   that is doing the procedure, when your results will be ready. Summary  A CT angiogram is a procedure to look at the blood vessels in various areas of the body.  You will need to stay very still during the exam.  You may be asked to drink water or other fluids to wash (flush) the contrast material out of your body after your scan. This information is not intended to replace advice given to you by your health care provider. Make sure you discuss any questions you have with your health care provider. Document  Released: 06/05/2016 Document Revised: 12/16/2018 Document Reviewed: 06/05/2016 Elsevier Patient Education  2020 Elsevier Inc.  

## 2019-09-12 NOTE — Progress Notes (Signed)
[facility], [place] [address]  Pulmonary Sleep Medicine   Office Visit Note  Patient Name: [name] DOB: 1980/12/22 MRN [id number]  Date of Service: 09/12/2019  Complaints/HPI: Feeling somewhat better. ON 4m Prednisone. May wean down to 334mfor 2 weeks then 2531mefore next visit.  The patient states that she is overall less short of breath she made some significant progress since her last visit.  She continues on 35 mg of prednisone and we can go ahead cut that down to 30 mg for the next 2 weeks as discussed and then we will also wean further down to 25 mg.  Spoke to her about doing a follow-up CT scan which will be ordered today and then I will see her back in about 2 weeks.  It is CT scan showing improvement then we will continue with the current management however if the CT scan shows worsening or no resolution we may be looking at having to do a lung biopsy.  I did discuss with her the possibility of having to do another lung biopsy and she states that she is okay with that if it is necessary.  Also instructed her to continue to monitor her blood pressures and sugars.  She has actually been doing quite well despite having been on the steroids.  ROS  General: (-) fever, (-) chills, (-) night sweats, (-) weakness Skin: (-) rashes, (-) itching,. Eyes: (-) visual changes, (-) redness, (-) itching. Nose and Sinuses: (-) nasal stuffiness or itchiness, (-) postnasal drip, (-) nosebleeds, (-) sinus trouble. Mouth and Throat: (-) sore throat, (-) hoarseness. Neck: (-) swollen glands, (-) enlarged thyroid, (-) neck pain. Respiratory: - cough, (-) bloody sputum, - shortness of breath, - wheezing. Cardiovascular: - ankle swelling, (-) chest pain. Lymphatic: (-) lymph node enlargement. Neurologic: (-) numbness, (-) tingling. Psychiatric: (-) anxiety, (-) depression   Current Medication: Outpatient Encounter Medications as of 09/12/2019   Medication Sig  . albuterol (PROVENTIL HFA;VENTOLIN HFA) 108 (90 Base) MCG/ACT inhaler Inhale 2 puffs into the lungs every 4 (four) hours as needed for wheezing or shortness of breath.  . aMarland Kitchenithromycin (ZITHROMAX) 250 MG tablet 1 zpk as directed  . levothyroxine (SYNTHROID) 125 MCG tablet TAKE 1 TABLET BY MOUTH ONCE DAILY EXCEPT TAKE 1 AND 1/2 TABLETS ON SUNDAYS  . predniSONE (DELTASONE) 5 MG tablet Take as directed  . sulfamethoxazole-trimethoprim (BACTRIM DS) 800-160 MG tablet Take 1 tab po Monday,wednesday  and Friday   No facility-administered encounter medications on file as of 09/12/2019.     Surgical History: Past Surgical History:  Procedure Laterality Date  . CESAREAN SECTION  2007  . CHOLECYSTECTOMY N/A 10/27/2016   Procedure: LAPAROSCOPIC CHOLECYSTECTOMY;  Surgeon: DieJules HusbandsD;  Location: [facility] ORS;  Service: General;  Laterality: N/A;  . DIAGNOSTIC LAPAROSCOPY  2018   X2  . FLEXIBLE BRONCHOSCOPY N/A 05/03/2019   Procedure: FLEXIBLE BRONCHOSCOPY;  Surgeon: KhaAllyne GeeD;  Location: [facility] ORS;  Service: Pulmonary;  Laterality: N/A;  . LUNG BIOPSY Right 2008   partial right lung resection.  was treated with steroids x 1 year  . TONSILLECTOMY AND ADENOIDECTOMY      Medical History: Past Medical History:  Diagnosis Date  . Anemia    Iron Deficiency in the past  . Asthma    in childhood  . Bowen's disease   . Complication of anesthesia    HARD TO WAKE UP SOMETIIMES  . Dyspnea 04/2019   sob  and coughing has increased of late  . Endometriosis    dx'd in high school  . Eosinophilic pneumonia ~56/8127   per Dr. Clayborn Bigness s/p VATS and 1 year of prednisone  . GERD (gastroesophageal reflux disease)    TUMS PRN  . Headache    MIGRAINES without aura  . History of abnormal Pap smear   . History of chicken pox   . Hypothyroidism    dx'd 2011  . Pneumonia 2009/ 5170   EOSINOPHILIC PNEUMONIA  . PONV (postoperative nausea and vomiting)   . Seizures ([place])     childhood-previously on Tegretol-been off meds for years with no seizures  . Urinary tract bacterial infections     Family History: Family History  Problem Relation Age of Onset  . Mental illness Mother        bipolar  . Cancer Father        h/o throat cancer- treated  . Heart disease Maternal Grandfather        at ate >60  . Alcohol abuse Maternal Grandfather   . Stomach cancer Neg Hx   . Rectal cancer Neg Hx   . Esophageal cancer Neg Hx   . Colon cancer Neg Hx   . Breast cancer Neg Hx     Social History: Social History   Socioeconomic History  . Marital status: Married    Spouse name: cory  . Number of children: 2  . Years of education: Not on file  . Highest education level: Not on file  Occupational History  . Occupation: Passenger transport manager    Comment: currently working  Social Needs  . Financial resource strain: Not on file  . Food insecurity    Worry: Not on file    Inability: Not on file  . Transportation needs    Medical: Not on file    Non-medical: Not on file  Tobacco Use  . Smoking status: Former Smoker    Packs/day: 0.50    Years: 10.00    Pack years: 5.00    Types: Cigarettes    Quit date: 10/21/2004    Years since quitting: 14.9  . Smokeless tobacco: Never Used  Substance and Sexual Activity  . Alcohol use: Yes    Comment: rare  . Drug use: No  . Sexual activity: Yes    Partners: Male    Birth control/protection: Condom  Lifestyle  . Physical activity    Days per week: Not on file    Minutes per session: Not on file  . Stress: Not on file  Relationships  . Social Herbalist on phone: Not on file    Gets together: Not on file    Attends religious service: Not on file    Active member of club or organization: Not on file    Attends meetings of clubs or organizations: Not on file    Relationship status: Not on file  . Intimate partner violence    Fear of current or ex partner: Not on file    Emotionally abused: Not on file     Physically abused: Not on file    Forced sexual activity: Not on file  Other Topics Concern  . Not on file  Social History Narrative   Caffeine use:  Yes   Regular exercise:  Yes   2 pregnancies = 2 live births, 1 son and 1 daughter   From [place], in [place] since 2006    Vital Signs: Blood pressure 102/76, pulse  77, temperature (!) 97.5 F (36.4 C), resp. rate 16, height _0  (1.575 m), weight 211 lb (95.7 kg), SpO2 97 %.  Examination: General Appearance: The patient is well-developed, well-nourished, and in no distress. Skin: Gross inspection of skin unremarkable. Head: normocephalic, no gross deformities. Eyes: no gross deformities noted. ENT: ears appear grossly normal no exudates. Neck: Supple. No thyromegaly. No LAD. Respiratory: no rhonchi noted. Cardiovascular: Normal S1 and S2 without murmur or rub. Extremities: No cyanosis. pulses are equal. Neurologic: Alert and oriented. No involuntary movements.  LABS: No results found for this or any previous visit (from the past 2160 hour(s)).  Radiology: Ct Chest Wo Contrast  Result Date: 06/29/2019 CLINICAL DATA:  Shortness of breath, follow-up eosinophilic pneumonia EXAM: CT CHEST WITHOUT CONTRAST TECHNIQUE: Multidetector CT imaging of the chest was performed following the standard protocol without IV contrast. COMPARISON:  04/21/2019, 09/27/2012 FINDINGS: Cardiovascular: No significant vascular findings. Normal heart size. No pericardial effusion. Mediastinum/Nodes: No enlarged mediastinal, hilar, or axillary lymph nodes. Thyroid gland, trachea, and esophagus demonstrate no significant findings. Lungs/Pleura: No significant interval change in bibasilar predominant fibrotic scarring, bronchiectasis, volume loss, and ground-glass opacity, right greater than left, with prior right lung wedge biopsies. No pleural effusion or pneumothorax. Upper Abdomen: No acute abnormality. Musculoskeletal: No chest wall mass or suspicious bone lesions  identified. IMPRESSION: No significant interval change in bibasilar predominant fibrotic scarring, bronchiectasis, volume loss, and ground-glass opacity, right greater than left, with prior right lung wedge biopsies. These findings have however worsened over time in comparison to more remote examination dated 2013. By report, patient has biopsy diagnosis of chronic eosinophilic pneumonia. There is no acutely superimposed airspace disease. Electronically Signed   By: Eddie Candle M.D.   On: 06/29/2019 08:26    No results found.  No results found.    Assessment and Plan: Patient Active Problem List   Diagnosis Date Noted  . Routine general medical examination at a health care facility 05/08/2019  . Lower respiratory infection 01/26/2019  . Lumbar radicular pain 12/21/2017  . Advance care planning 05/04/2017  . Calculus of gallbladder without cholecystitis without obstruction 08/05/2016  . Migraine without aura 04/18/2012  . Cough 02/06/2012  . Eosinophilic pneumonia 29/93/7169  . Hypothyroidism 02/07/2011    1. Eosinophilic pneumonia clinically she seems to be showing signs of improvement with less symptoms.  She does not have a cough she is not short of breath.  From perspective her radiological studies were going to do a repeat CT high resolution of her chest. 2. ILD she has no significant changes on the last CT.  On steroids she is clinically improving as already noted we will follow-up CT high resolution 3. SOB again as noted this is improving we will continue with supportive care 4. Bronchiectasis no active flareup she does not note any increase in sputum production she has no hemoptysis noted.  Flu vaccination was also provided.  General Counseling: I have discussed the findings of the evaluation and examination with Anderson Malta.  I have also discussed any further diagnostic evaluation thatmay be needed or ordered today. Deziray verbalizes understanding of the findings of todays visit.  We also reviewed her medications today and discussed drug interactions and side effects including but not limited excessive drowsiness and altered mental states. We also discussed that there is always a risk not just to her but also people around her. she has been encouraged to call the office with any questions or concerns that should arise related to todays visit.  Orders Placed This Encounter  Procedures  . CT Chest High Resolution    Standing Status:   Future    Standing Expiration Date:   11/11/2020    Order Specific Question:   Is patient pregnant?    Answer:   No    Order Specific Question:   Preferred imaging location?    Answer:   [facility]    Order Specific Question:   Radiology Contrast Protocol - do NOT remove file path    Answer:   \\[email or web address]  . Flu Vaccine MDCK QUAD PF     Time spent: 74mn  I have personally obtained a history, examined the patient, evaluated laboratory and imaging results, formulated the assessment and plan and placed orders.    SAllyne Gee MD FMat[facility] LL[facility]ulmonary and Critical Care Sleep medicine

## 2019-09-27 ENCOUNTER — Ambulatory Visit
Admission: RE | Admit: 2019-09-27 | Discharge: 2019-09-27 | Disposition: A | Discharge status: Home / Self Care | Payer: 59 | Source: Ambulatory Visit | Attending: Internal Medicine | Admitting: Internal Medicine

## 2019-09-27 ENCOUNTER — Other Ambulatory Visit: Payer: Self-pay

## 2019-09-27 DIAGNOSIS — J849 Interstitial pulmonary disease, unspecified: Secondary | ICD-10-CM | POA: Diagnosis not present

## 2019-10-04 ENCOUNTER — Telehealth: Payer: Self-pay

## 2019-10-04 NOTE — Telephone Encounter (Signed)
Called lmom informing patient of appointment. klh 

## 2019-10-06 ENCOUNTER — Ambulatory Visit: Payer: 59 | Admitting: Internal Medicine

## 2019-10-06 ENCOUNTER — Other Ambulatory Visit: Payer: Self-pay

## 2019-10-06 ENCOUNTER — Encounter: Payer: Self-pay | Admitting: Internal Medicine

## 2019-10-06 VITALS — BP 116/87 | HR 80 | Ht 62.5 in | Wt 218.0 lb

## 2019-10-06 DIAGNOSIS — B49 Unspecified mycosis: Secondary | ICD-10-CM

## 2019-10-06 DIAGNOSIS — J84112 Idiopathic pulmonary fibrosis: Secondary | ICD-10-CM

## 2019-10-06 DIAGNOSIS — J17 Pneumonia in diseases classified elsewhere: Secondary | ICD-10-CM

## 2019-10-06 DIAGNOSIS — J479 Bronchiectasis, uncomplicated: Secondary | ICD-10-CM | POA: Diagnosis not present

## 2019-10-06 DIAGNOSIS — J8281 Chronic eosinophilic pneumonia: Secondary | ICD-10-CM

## 2019-10-06 MED ORDER — VORICONAZOLE 200 MG PO TABS: 200.0000 mg | ORAL_TABLET | Freq: Two times a day (BID) | ORAL | 4 refills | Status: DC

## 2019-10-06 NOTE — Progress Notes (Signed)
[facility] [place], [place] [address]  Pulmonary Sleep Medicine   Office Visit Note  Patient Name: [name] DOB: 04/07/1981 MRN [id number]  Date of Service: 10/06/2019  Complaints/HPI: She is here for follow-up after CT scan of the chest.  Clinically she states she is feeling better she is less short of breath.  She is down to 30 mg on the prednisone.  Patient states that she is as already noted doing much better clinically.  She continues to have issues with a limp related to the prednisone.  Policy had a CT scan as already mentioned on the CT scan which I personally reviewed with the patient and compared it with the CT from July looks slightly better but definitely not any worse.  She also has previously mentioned had fungal growth in her bronchoscopy specimen and because there is not much in the way of improvement I think it might be reasonable to consider therapy for fungal infection at this point.  The patient and I discussed this at length and she is in agreement.  Worst-case narrow this could represent Aspergillus infection and along with a history of bronchiectasis I would recommend placing her on voriconazole.  ROS  General: (-) fever, (-) chills, (-) night sweats, (-) weakness Skin: (-) rashes, (-) itching,. Eyes: (-) visual changes, (-) redness, (-) itching. Nose and Sinuses: (-) nasal stuffiness or itchiness, (-) postnasal drip, (-) nosebleeds, (-) sinus trouble. Mouth and Throat: (-) sore throat, (-) hoarseness. Neck: (-) swollen glands, (-) enlarged thyroid, (-) neck pain. Respiratory: - cough, (-) bloody sputum, - shortness of breath, - wheezing. Cardiovascular: - ankle swelling, (-) chest pain. Lymphatic: (-) lymph node enlargement. Neurologic: (-) numbness, (-) tingling. Psychiatric: (-) anxiety, (-) depression   Current Medication: Outpatient Encounter Medications as of 10/06/2019  Medication Sig  . albuterol (PROVENTIL  HFA;VENTOLIN HFA) 108 (90 Base) MCG/ACT inhaler Inhale 2 puffs into the lungs every 4 (four) hours as needed for wheezing or shortness of breath.  Marland Kitchen azithromycin (ZITHROMAX) 250 MG tablet 1 zpk as directed  . levothyroxine (SYNTHROID) 125 MCG tablet TAKE 1 TABLET BY MOUTH ONCE DAILY EXCEPT TAKE 1 AND 1/2 TABLETS ON SUNDAYS  . predniSONE (DELTASONE) 5 MG tablet Take as directed  . sulfamethoxazole-trimethoprim (BACTRIM DS) 800-160 MG tablet Take 1 tab po Monday,wednesday  and Friday   No facility-administered encounter medications on file as of 10/06/2019.    Surgical History: Past Surgical History:  Procedure Laterality Date  . CESAREAN SECTION  2007  . CHOLECYSTECTOMY N/A 10/27/2016   Procedure: LAPAROSCOPIC CHOLECYSTECTOMY;  Surgeon: Jules Husbands, MD;  Location: [facility] ORS;  Service: General;  Laterality: N/A;  . DIAGNOSTIC LAPAROSCOPY  2018   X2  . FLEXIBLE BRONCHOSCOPY N/A 05/03/2019   Procedure: FLEXIBLE BRONCHOSCOPY;  Surgeon: Allyne Gee, MD;  Location: [facility] ORS;  Service: Pulmonary;  Laterality: N/A;  . LUNG BIOPSY Right 2008   partial right lung resection.  was treated with steroids x 1 year  . TONSILLECTOMY AND ADENOIDECTOMY      Medical History: Past Medical History:  Diagnosis Date  . Anemia    Iron Deficiency in the past  . Asthma    in childhood  . Bowen's disease   . Complication of anesthesia    HARD TO WAKE UP SOMETIIMES  . Dyspnea 04/2019   sob and coughing has increased of late  . Endometriosis    dx'd in high school  . Eosinophilic pneumonia ~27/0786  per Dr. Clayborn Bigness s/p VATS and 1 year of prednisone  . GERD (gastroesophageal reflux disease)    TUMS PRN  . Headache    MIGRAINES without aura  . History of abnormal Pap smear   . History of chicken pox   . Hypothyroidism    dx'd 2011  . Pneumonia 2009/ 6203   EOSINOPHILIC PNEUMONIA  . PONV (postoperative nausea and vomiting)   . Seizures ([place])    childhood-previously on Tegretol-been off meds  for years with no seizures  . Urinary tract bacterial infections     Family History: Family History  Problem Relation Age of Onset  . Mental illness Mother        bipolar  . Cancer Father        h/o throat cancer- treated  . Heart disease Maternal Grandfather        at ate >60  . Alcohol abuse Maternal Grandfather   . Stomach cancer Neg Hx   . Rectal cancer Neg Hx   . Esophageal cancer Neg Hx   . Colon cancer Neg Hx   . Breast cancer Neg Hx     Social History: Social History   Socioeconomic History  . Marital status: Married    Spouse name: cory  . Number of children: 2  . Years of education: Not on file  . Highest education level: Not on file  Occupational History  . Occupation: Passenger transport manager    Comment: currently working  Tobacco Use  . Smoking status: Former Smoker    Packs/day: 0.50    Years: 10.00    Pack years: 5.00    Types: Cigarettes    Quit date: 10/21/2004    Years since quitting: 14.9  . Smokeless tobacco: Never Used  Substance and Sexual Activity  . Alcohol use: Yes    Comment: rare  . Drug use: No  . Sexual activity: Yes    Partners: Male    Birth control/protection: Condom  Other Topics Concern  . Not on file  Social History Narrative   Caffeine use:  Yes   Regular exercise:  Yes   2 pregnancies = 2 live births, 1 son and 1 daughter   From [place], in [place] since 2006   Social Determinants of Health   Financial Resource Strain:   . Difficulty of Paying Living Expenses: Not on file  Food Insecurity:   . Worried About Charity fundraiser in the Last Year: Not on file  . Ran Out of Food in the Last Year: Not on file  Transportation Needs:   . Lack of Transportation (Medical): Not on file  . Lack of Transportation (Non-Medical): Not on file  Physical Activity:   . Days of Exercise per Week: Not on file  . Minutes of Exercise per Session: Not on file  Stress:   . Feeling of Stress : Not on file  Social Connections:   . Frequency of  Communication with Friends and Family: Not on file  . Frequency of Social Gatherings with Friends and Family: Not on file  . Attends Religious Services: Not on file  . Active Member of Clubs or Organizations: Not on file  . Attends Archivist Meetings: Not on file  . Marital Status: Not on file  Intimate Partner Violence:   . Fear of Current or Ex-Partner: Not on file  . Emotionally Abused: Not on file  . Physically Abused: Not on file  . Sexually Abused: Not on file  Vital Signs: Blood pressure 116/87, pulse 80, height 5' 2.5" (1.588 m), weight 218 lb (98.9 kg), last menstrual period 09/27/2019, SpO2 100 %.  Examination: General Appearance: The patient is well-developed, well-nourished, and in no distress. Skin: Gross inspection of skin unremarkable. Head: normocephalic, no gross deformities. Eyes: no gross deformities noted. ENT: ears appear grossly normal no exudates. Neck: Supple. No thyromegaly. No LAD. Respiratory: No rhonchi no rales simply clear exam. Cardiovascular: Normal S1 and S2 without murmur or rub. Extremities: No cyanosis. pulses are equal. Neurologic: Alert and oriented. No involuntary movements.  LABS: No results found for this or any previous visit (from the past 2160 hour(s)).  Radiology: CT Chest High Resolution  Result Date: 09/27/2019 CLINICAL DATA:  Interstitial lung disease EXAM: CT CHEST WITHOUT CONTRAST TECHNIQUE: Multidetector CT imaging of the chest was performed following the standard protocol without intravenous contrast. High resolution imaging of the lungs, as well as inspiratory and expiratory imaging, was performed. COMPARISON:  06/28/2019, 04/21/2019, 09/27/2012 FINDINGS: Cardiovascular: No significant vascular findings. Normal heart size. No pericardial effusion. Mediastinum/Nodes: No enlarged mediastinal, hilar, or axillary lymph nodes. Thyroid gland, trachea, and esophagus demonstrate no significant findings. Lungs/Pleura: No  significant interval change in bibasilar predominant pattern of moderate fibrosis featuring extensive bronchiectasis and volume loss of the lower lobes, and extensive associated ground-glass opacity, most conspicuous in the right lower lobe. Redemonstrated postoperative findings of right lung wedge biopsies. No pleural effusion or pneumothorax. Upper Abdomen: No acute abnormality. Musculoskeletal: No chest wall mass or suspicious bone lesions identified. IMPRESSION: No significant interval change in bibasilar predominant pattern of fibrosis featuring extensive bronchiectasis and volume loss of the lower lobes and extensive associated ground-glass opacity, most conspicuous in the right lower lobe. Redemonstrated postoperative findings of right lung wedge biopsies. As noted on prior examination, these findings have worsened over time in comparison to remote prior examination dated 2013 and by report patient has a biopsy diagnosis of chronic eosinophilic pneumonia. No acute airspace disease at this time. Electronically Signed   By: Eddie Candle M.D.   On: 09/27/2019 10:33    No results found.  CT Chest High Resolution  Result Date: 09/27/2019 CLINICAL DATA:  Interstitial lung disease EXAM: CT CHEST WITHOUT CONTRAST TECHNIQUE: Multidetector CT imaging of the chest was performed following the standard protocol without intravenous contrast. High resolution imaging of the lungs, as well as inspiratory and expiratory imaging, was performed. COMPARISON:  06/28/2019, 04/21/2019, 09/27/2012 FINDINGS: Cardiovascular: No significant vascular findings. Normal heart size. No pericardial effusion. Mediastinum/Nodes: No enlarged mediastinal, hilar, or axillary lymph nodes. Thyroid gland, trachea, and esophagus demonstrate no significant findings. Lungs/Pleura: No significant interval change in bibasilar predominant pattern of moderate fibrosis featuring extensive bronchiectasis and volume loss of the lower lobes, and  extensive associated ground-glass opacity, most conspicuous in the right lower lobe. Redemonstrated postoperative findings of right lung wedge biopsies. No pleural effusion or pneumothorax. Upper Abdomen: No acute abnormality. Musculoskeletal: No chest wall mass or suspicious bone lesions identified. IMPRESSION: No significant interval change in bibasilar predominant pattern of fibrosis featuring extensive bronchiectasis and volume loss of the lower lobes and extensive associated ground-glass opacity, most conspicuous in the right lower lobe. Redemonstrated postoperative findings of right lung wedge biopsies. As noted on prior examination, these findings have worsened over time in comparison to remote prior examination dated 2013 and by report patient has a biopsy diagnosis of chronic eosinophilic pneumonia. No acute airspace disease at this time. Electronically Signed   By: Dorna Bloom.D.  On: 09/27/2019 10:33      Assessment and Plan: Patient Active Problem List   Diagnosis Date Noted  . Routine general medical examination at a health care facility 05/08/2019  . Lower respiratory infection 01/26/2019  . Lumbar radicular pain 12/21/2017  . Advance care planning 05/04/2017  . Calculus of gallbladder without cholecystitis without obstruction 08/05/2016  . Migraine without aura 04/18/2012  . Cough 02/06/2012  . Eosinophilic pneumonia 44/69/5072  . Hypothyroidism 02/07/2011    1. Eosinophilic pneumonia she is on prednisone clinically she is improving very significantly and we can continue to taper her steroids down she should go down to 25 mg and then to 20 mg have instructed her in how to do this over the course of the next 2 weeks. 2. Fungal Pneumonia this is the major question that we need to consider without having to subject her to another biopsy because of the growth of fungus specifically Candida and a concern for the possibility of Aspergillus I would place her on antifungal therapy.   Prescription has been sent. 3. Bronchiectasis patient right now is without exacerbation we will continue with supportive care sheet and was advised on what to look for for acute respirations. 4. Steroid dependent weaned down to 25 mg and 20 mg as already discussed above. 5. Rash on passing she stated that she was having a lot of itching and she has not noticed a frank rash but has noted some redness on her arms.  She is on Bactrim this could potentially be a side effect of the Bactrim I have asked her to discontinue the Bactrim and see if there is any improvement before we do any further intervention.  General Counseling: I have discussed the findings of the evaluation and examination with Anderson Malta.  I have also discussed any further diagnostic evaluation thatmay be needed or ordered today. Linzee verbalizes understanding of the findings of todays visit. We also reviewed her medications today and discussed drug interactions and side effects including but not limited excessive drowsiness and altered mental states. We also discussed that there is always a risk not just to her but also people around her. she has been encouraged to call the office with any questions or concerns that should arise related to todays visit.  No orders of the defined types were placed in this encounter.    Time spent: 78mn  I have personally obtained a history, examined the patient, evaluated laboratory and imaging results, formulated the assessment and plan and placed orders.    SAllyne Gee MD [facility]Atl[facility] LL[facility]ulmonary and Critical Care Sleep medicine

## 2019-10-06 NOTE — Patient Instructions (Signed)
Voriconazole tablets What is this medicine? VORICONAZOLE (vohr ih KON uh zohl) is an antifungal. It stops the growth of some fungus and yeast. This medicine is used to treat many kinds of fungal infections. This medicine may be used for other purposes; ask your health care provider or pharmacist if you have questions. COMMON BRAND NAME(S): VFEND What should I tell my health care provider before I take this medicine? They need to know if you have any of these conditions:  heart disease  history of irregular heartbeat  kidney disease  liver disease  an unusual or allergic reaction to voriconazole, other antifungal medicines, foods, dyes or preservatives  pregnant or trying to get pregnant  breast-feeding How should I use this medicine? Take this medicine by mouth with a glass of water. Follow the directions on the prescription label. Take this medicine on an empty stomach, at least one hour before or one hour after a meal. Do not take with food. Take your medicine at regular intervals. Do not take your medicine more often than directed. Take all of your medicine as directed even if you think you are better. Do not skip doses or stop your medicine early. Talk to your pediatrician regarding the use of this medicine in children. While this drug may be prescribed for children as young as 2 years for selected conditions, precautions do apply. Overdosage: If you think you have taken too much of this medicine contact a poison control center or emergency room at once. NOTE: This medicine is only for you. Do not share this medicine with others. What if I miss a dose? If you miss a dose, take it as soon as you can. If it is almost time for your next dose, take only that dose. Do not take double or extra doses. What may interact with this medicine? Do not take this medicine with any of the following medications:  astemizole  barbiturates like phenobarbital  carbamazepine  certain antibiotics  like rifabutin, rifampin  certain medicines for cholesterol like atorvastatin, lovastatin, simvastatin, lomitapide  certain medicines for irregular heart beat like dronedarone, quinidine  cisapride  efavirenz  ergot alkaloids like dihydroergotamine, ergonovine, ergotamine, methylergonovine  ibrutinib  naloxegol  other medicines that prolong the QT interval (cause an abnormal heart rhythm)  pimozide  ranolazine  ritonavir  sirolimus  St. John's wort  terfenadine  thioridazine This medicine may also interact with the following medications:  antiviral medicines for HIV or AIDS  birth control pills  certain medicines for blood pressure like amlodipine, nifedipine  certain medicines for cancer like vinblastine, vincristine  certain medicines for depression, anxiety, or psychotic disturbances like alprazolam, midazolam, triazolam  certain medicines for diabetes like glipizide, glyburide, tolbutamide  certain medicines for pain like alfentanil, fentanyl, methadone, oxycodone  certain medicines for stomach problems like omeprazole  certain medicines that treat or prevent blood clots like warfarin  dofetilide  medicines that lower your chance of fighting infection like cyclosporine, everolimus, tacrolimus  NSAIDS, medicines for pain and inflammation, like diclofenac, ibuprofen  other medicines for fungal infections  phenytoin  ziprasidone This list may not describe all possible interactions. Give your health care provider a list of all the medicines, herbs, non-prescription drugs, or dietary supplements you use. Also tell them if you smoke, drink alcohol, or use illegal drugs. Some items may interact with your medicine. What should I watch for while using this medicine? Visit your doctor or health care professional for regular checkups. If you are taking this medicine  for a long time you may need blood work. Tell your doctor if your symptoms do not improve. Some  fungal infections need many weeks or months of treatment to cure. You may have changes in vision, including blurring and/or light sensitivity. Do not drive at night while taking this medicine. If you notice a change in vision avoid potentially hazardous tasks, such as driving or operating machinery. Avoid strong, direct sunlight during this therapy. Women should inform their doctor if they wish to become pregnant or think they might be pregnant. There is a potential for serious side effects to an unborn child. Talk to your health care professional or pharmacist for more information. What side effects may I notice from receiving this medicine? Side effects that you should report to your doctor or health care professional as soon as possible:  allergic reactions like skin rash or itching, hives, swelling of the lips, mouth, tongue, or throat  change in vision  dark urine  difficulty breathing  dizzy, faint  fast heart rate  fever, chills, infection  hallucinations  less or more urine  pale colored stools  redness, blistering, peeling or loosening of the skin, including inside the mouth  seizure, tremor  stomach pain  unusual bruising or bleeding  yellowing of eyes or skin Side effects that usually do not require medical attention (report to your doctor or health care professional if they continue or are bothersome):  agitation, anxiety, confusion  diarrhea  dry mouth  headache  loss of appetite  nausea, vomiting This list may not describe all possible side effects. Call your doctor for medical advice about side effects. You may report side effects to FDA at 1-800-FDA-1088. Where should I keep my medicine? Keep out of the reach of children. Store at room temperature between 15 and 30 degrees C (59 and 86 degrees F). Keep container tightly closed. Throw away any unused medicine after the expiration date. NOTE: This sheet is a summary. It may not cover all possible  information. If you have questions about this medicine, talk to your doctor, pharmacist, or health care provider.  2020 Elsevier/Gold Standard (2018-09-28 12:13:08)

## 2019-10-10 ENCOUNTER — Ambulatory Visit
Admission: EM | Admit: 2019-10-10 | Discharge: 2019-10-10 | Disposition: A | Discharge status: Home / Self Care | Payer: 59 | Attending: Internal Medicine | Admitting: Internal Medicine

## 2019-10-10 ENCOUNTER — Other Ambulatory Visit: Payer: Self-pay

## 2019-10-10 DIAGNOSIS — L299 Pruritus, unspecified: Secondary | ICD-10-CM

## 2019-10-10 LAB — COMPREHENSIVE METABOLIC PANEL
ALT: 22 U/L (ref 0–44)
AST: 20 U/L (ref 15–41)
Albumin: 3.8 g/dL (ref 3.5–5.0)
Alkaline Phosphatase: 39 U/L (ref 38–126)
Anion gap: 7 (ref 5–15)
BUN: 13 mg/dL (ref 6–20)
CO2: 25 mmol/L (ref 22–32)
Calcium: 9 mg/dL (ref 8.9–10.3)
Chloride: 102 mmol/L (ref 98–111)
Creatinine, Ser: 0.66 mg/dL (ref 0.44–1.00)
GFR calc Af Amer: >60 mL/min (ref >60)
GFR calc non Af Amer: >60 mL/min (ref >60)
Glucose, Bld: 93 mg/dL (ref 70–99)
Potassium: 3.1 mmol/L — ABNORMAL LOW (ref 3.5–5.1)
Sodium: 134 mmol/L — ABNORMAL LOW (ref 135–145)
Total Bilirubin: 0.6 mg/dL (ref 0.3–1.2)
Total Protein: 7 g/dL (ref 6.5–8.1)

## 2019-10-10 LAB — TSH: TSH: 2.919 u[IU]/mL (ref 0.350–4.500)

## 2019-10-10 NOTE — ED Triage Notes (Signed)
Pt states for past 10 days she feels "really itchy all over" but no rash. Has tried home remedies without any resolution

## 2019-10-10 NOTE — ED Provider Notes (Addendum)
[facility]    CSN: [id number] Arrival date & time: 10/10/19  1255      History   Chief Complaint Chief Complaint  Patient presents with  . Pruritis    HPI [name] is a 38 y.o. female. who present with itching all over her body which is getting worse but does not have a rash with it. The itching Started on her axilla, and moved to to the rest of her body and yesterday the back of her head started itching even worse. She denies taking any new meds. Has been on Prednisone and Bactrim since July this year. Has not tried any meds for itching. Has tried lotions and oatmeal bath's which has not helped. Them mentioned medication tablets look the same as they have been before. I noticed her TSH was elevated in July and she stated she was aware of this but her provider told her it was not by much. I saw an active order for Future TSH, but pt states she was not told she needed to have it repeated. Denies anything new.     Past Medical History:  Diagnosis Date  . Anemia    Iron Deficiency in the past  . Asthma    in childhood  . Bowen's disease   . Complication of anesthesia    HARD TO WAKE UP SOMETIIMES  . Dyspnea 04/2019   sob and coughing has increased of late  . Endometriosis    dx'd in high school  . Eosinophilic pneumonia ~19/5093   per Dr. Clayborn Bigness s/p VATS and 1 year of prednisone  . GERD (gastroesophageal reflux disease)    TUMS PRN  . Headache    MIGRAINES without aura  . History of abnormal Pap smear   . History of chicken pox   . Hypothyroidism    dx'd 2011  . Pneumonia 2009/ 2671   EOSINOPHILIC PNEUMONIA  . PONV (postoperative nausea and vomiting)   . Seizures ([place])    childhood-previously on Tegretol-been off meds for years with no seizures  . Urinary tract bacterial infections     Patient Active Problem List   Diagnosis Date Noted  . Routine general medical examination at a health care facility 05/08/2019  . Lower respiratory  infection 01/26/2019  . Lumbar radicular pain 12/21/2017  . Advance care planning 05/04/2017  . Calculus of gallbladder without cholecystitis without obstruction 08/05/2016  . Migraine without aura 04/18/2012  . Cough 02/06/2012  . Eosinophilic pneumonia 24/58/0998  . Hypothyroidism 02/07/2011    Past Surgical History:  Procedure Laterality Date  . CESAREAN SECTION  2007  . CHOLECYSTECTOMY N/A 10/27/2016   Procedure: LAPAROSCOPIC CHOLECYSTECTOMY;  Surgeon: Jules Husbands, MD;  Location: [facility] ORS;  Service: General;  Laterality: N/A;  . DIAGNOSTIC LAPAROSCOPY  2018   X2  . FLEXIBLE BRONCHOSCOPY N/A 05/03/2019   Procedure: FLEXIBLE BRONCHOSCOPY;  Surgeon: Allyne Gee, MD;  Location: [facility] ORS;  Service: Pulmonary;  Laterality: N/A;  . LUNG BIOPSY Right 2008   partial right lung resection.  was treated with steroids x 1 year  . TONSILLECTOMY AND ADENOIDECTOMY      OB History   No obstetric history on file.      Home Medications    Prior to Admission medications   Medication Sig Start Date End Date Taking? Authorizing Provider  albuterol (PROVENTIL HFA;VENTOLIN HFA) 108 (90 Base) MCG/ACT inhaler Inhale 2 puffs into the lungs every 4 (four) hours as needed for wheezing or shortness of  breath. 12/30/18   Marylene Land, NP  levothyroxine (SYNTHROID) 125 MCG tablet TAKE 1 TABLET BY MOUTH ONCE DAILY EXCEPT TAKE 1 AND 1/2 TABLETS ON SUNDAYS 05/20/19   [name], MD  predniSONE (DELTASONE) 5 MG tablet Take as directed 08/11/19   Allyne Gee, MD  sulfamethoxazole-trimethoprim (BACTRIM DS) 800-160 MG tablet Take 1 tab po Monday,wednesday  and Friday 06/22/19   Allyne Gee, MD  voriconazole (VFEND) 200 MG tablet Take 1 tablet (200 mg total) by mouth 2 (two) times daily. 10/06/19   Allyne Gee, MD    Family History Family History  Problem Relation Age of Onset  . Mental illness Mother        bipolar  . Cancer Father        h/o throat cancer- treated  . Heart disease  Maternal Grandfather        at ate >60  . Alcohol abuse Maternal Grandfather   . Stomach cancer Neg Hx   . Rectal cancer Neg Hx   . Esophageal cancer Neg Hx   . Colon cancer Neg Hx   . Breast cancer Neg Hx     Social History Social History   Tobacco Use  . Smoking status: Former Smoker    Packs/day: 0.50    Years: 10.00    Pack years: 5.00    Types: Cigarettes    Quit date: 10/21/2004    Years since quitting: 14.9  . Smokeless tobacco: Never Used  Substance Use Topics  . Alcohol use: Yes    Comment: rare  . Drug use: No     Allergies   Patient has no known allergies.   Review of Systems Review of Systems Has acne rash on her chest from the prednsione, otherwise 10 point ROS is neg.   Physical Exam Triage Vital Signs ED Triage Vitals  Enc Vitals Group     BP 10/10/19 1312 104/72     Pulse Rate 10/10/19 1312 86     Resp 10/10/19 1312 18     Temp 10/10/19 1312 98.3 F (36.8 C)     Temp Source 10/10/19 1312 Oral     SpO2 10/10/19 1312 100 %     Weight 10/10/19 1314 218 lb (98.9 kg)     Height 10/10/19 1314 _0  (1.575 m)     Head Circumference --      Peak Flow --      Pain Score 10/10/19 1314 0     Pain Loc --      Pain Edu? --      Excl. in [place]? --    No data found.  Updated Vital Signs BP 104/72 (BP Location: Left Arm)   Pulse 86   Temp 98.3 F (36.8 C) (Oral)   Resp 18   Ht _1  (1.575 m)   Wt 218 lb (98.9 kg)   LMP 09/27/2019   SpO2 100%   BMI 39.87 kg/m   Visual Acuity Right Eye Distance:   Left Eye Distance:   Bilateral Distance:    Right Eye Near:   Left Eye Near:    Bilateral Near:     Physical Exam Constitutional:      General: She is not in acute distress.    Appearance: She is obese. She is not toxic-appearing.  HENT:     Head: Atraumatic.     Right Ear: Tympanic membrane, ear canal and external ear normal.     Left Ear: Tympanic membrane, ear  canal and external ear normal.     Nose: Nose normal.  Eyes:     General: No  scleral icterus.    Conjunctiva/sclera: Conjunctivae normal.  Cardiovascular:     Rate and Rhythm: Normal rate and regular rhythm.     Heart sounds: No murmur.  Pulmonary:     Effort: Pulmonary effort is normal.     Breath sounds: Normal breath sounds.  Musculoskeletal:        General: Normal range of motion.     Cervical back: Neck supple. No rigidity.  Lymphadenopathy:     Cervical: No cervical adenopathy.  Skin:    General: Skin is warm and dry.     Findings: Rash present.     Comments: Few papules noted on her upper chest. No other rashes noted.   Neurological:     Mental Status: She is alert and oriented to person, place, and time.     Motor: No weakness.     Gait: Gait normal.  Psychiatric:        Mood and Affect: Mood normal.        Behavior: Behavior normal.        Thought Content: Thought content normal.        Judgment: Judgment normal.      UC Treatments / Results  Labs (all labs ordered are listed, but only abnormal results are displayed) Labs Reviewed - No data to display  EKG   Radiology No results found.  Procedures Procedures (including critical care time)  Medications Ordered in UC Medications - No data to display  Initial Impression / Assessment and Plan / UC Course  I have reviewed the triage vital signs and the nursing notes. I explained to her, that the prior labs I see, there are not liver functions and I would like to check those as well as her TSH. She is in agreement. We will inform her of the results when they return. In the mean time she may take Claritin 10 mg qd.   Final Clinical Impressions(s) / UC Diagnoses   Final diagnoses:  None   Discharge Instructions   None    ED Prescriptions    None     PDMP not reviewed this encounter.   Shelby Mattocks, PA-C 10/10/19 [place], [place], [place] 10/10/19 1355

## 2019-10-10 NOTE — Discharge Instructions (Signed)
I am going to check your thyroid and liver enzymes and the results comes back we will inform you via Mychart.  In the mean time you may take Claritin 10 mg every day.

## 2019-11-01 ENCOUNTER — Other Ambulatory Visit: Payer: Self-pay

## 2019-11-01 ENCOUNTER — Ambulatory Visit: Payer: 59 | Admitting: Internal Medicine

## 2019-11-01 DIAGNOSIS — R0602 Shortness of breath: Secondary | ICD-10-CM

## 2019-11-01 DIAGNOSIS — J301 Allergic rhinitis due to pollen: Secondary | ICD-10-CM

## 2019-11-01 DIAGNOSIS — J8281 Chronic eosinophilic pneumonia: Secondary | ICD-10-CM

## 2019-11-01 DIAGNOSIS — J479 Bronchiectasis, uncomplicated: Secondary | ICD-10-CM | POA: Diagnosis not present

## 2019-11-01 MED ORDER — AZITHROMYCIN 250 MG PO TABS: ORAL_TABLET | ORAL | 0 refills | Status: DC

## 2019-11-01 NOTE — Progress Notes (Signed)
[facility] [place], [place] [address]  Internal MEDICINE  Telephone Visit  Patient Name: [name]  [id number]  [id number]  Date of Service: 11/01/2019  I connected with the patient at 1323 by telephone and verified the patients identity using two identifiers.   I discussed the limitations, risks, security and privacy concerns of performing an evaluation and management service by telephone and the availability of in person appointments. I also discussed with the patient that there may be a patient responsible charge related to the service.  The patient expressed understanding and agrees to proceed.      HPI  Patient states that she is noted cough which is mostly dry.  She states that there is no blood no sputum production.  She denied having any exposure to anybody with Covid.  She does note some shortness of breath and she states her breathing aids feels as it felt when she had first come into the office.  She denies having any rash.  She denies having fever no chills.  She has not checked her pulse oximeter.  Also she does not have a chest x-ray on file recently.  Denies having any swelling of her legs.  She currently is on prednisone 20 mg also on supplemental inhalers as necessary.  She does not feel as though she is having a rapid decline in her status at this time.   Current Medication: Outpatient Encounter Medications as of 11/01/2019  Medication Sig  . albuterol (PROVENTIL HFA;VENTOLIN HFA) 108 (90 Base) MCG/ACT inhaler Inhale 2 puffs into the lungs every 4 (four) hours as needed for wheezing or shortness of breath.  . levothyroxine (SYNTHROID) 125 MCG tablet TAKE 1 TABLET BY MOUTH ONCE DAILY EXCEPT TAKE 1 AND 1/2 TABLETS ON SUNDAYS  . predniSONE (DELTASONE) 5 MG tablet Take as directed  . sulfamethoxazole-trimethoprim (BACTRIM DS) 800-160 MG tablet Take 1 tab po Monday,wednesday  and Friday  . voriconazole (VFEND) 200 MG tablet Take 1 tablet (200  mg total) by mouth 2 (two) times daily.   No facility-administered encounter medications on file as of 11/01/2019.    Surgical History: Past Surgical History:  Procedure Laterality Date  . CESAREAN SECTION  2007  . CHOLECYSTECTOMY N/A 10/27/2016   Procedure: LAPAROSCOPIC CHOLECYSTECTOMY;  Surgeon: Jules Husbands, MD;  Location: [facility] ORS;  Service: General;  Laterality: N/A;  . DIAGNOSTIC LAPAROSCOPY  2018   X2  . FLEXIBLE BRONCHOSCOPY N/A 05/03/2019   Procedure: FLEXIBLE BRONCHOSCOPY;  Surgeon: Allyne Gee, MD;  Location: [facility] ORS;  Service: Pulmonary;  Laterality: N/A;  . LUNG BIOPSY Right 2008   partial right lung resection.  was treated with steroids x 1 year  . TONSILLECTOMY AND ADENOIDECTOMY      Medical History: Past Medical History:  Diagnosis Date  . Anemia    Iron Deficiency in the past  . Asthma    in childhood  . Bowen's disease   . Complication of anesthesia    HARD TO WAKE UP SOMETIIMES  . Dyspnea 04/2019   sob and coughing has increased of late  . Endometriosis    dx'd in high school  . Eosinophilic pneumonia ~67/3419   per Dr. Clayborn Bigness s/p VATS and 1 year of prednisone  . GERD (gastroesophageal reflux disease)    TUMS PRN  . Headache    MIGRAINES without aura  . History of abnormal Pap smear   . History of chicken pox   . Hypothyroidism  dx'd 2011  . Pneumonia 2009/ 5597   EOSINOPHILIC PNEUMONIA  . PONV (postoperative nausea and vomiting)   . Seizures ([place])    childhood-previously on Tegretol-been off meds for years with no seizures  . Urinary tract bacterial infections     Family History: Family History  Problem Relation Age of Onset  . Mental illness Mother        bipolar  . Cancer Father        h/o throat cancer- treated  . Heart disease Maternal Grandfather        at ate >60  . Alcohol abuse Maternal Grandfather   . Stomach cancer Neg Hx   . Rectal cancer Neg Hx   . Esophageal cancer Neg Hx   . Colon cancer Neg Hx   . Breast  cancer Neg Hx     Social History   Socioeconomic History  . Marital status: Married    Spouse name: cory  . Number of children: 2  . Years of education: Not on file  . Highest education level: Not on file  Occupational History  . Occupation: Passenger transport manager    Comment: currently working  Tobacco Use  . Smoking status: Former Smoker    Packs/day: 0.50    Years: 10.00    Pack years: 5.00    Types: Cigarettes    Quit date: 10/21/2004    Years since quitting: 15.0  . Smokeless tobacco: Never Used  Substance and Sexual Activity  . Alcohol use: Yes    Comment: rare  . Drug use: No  . Sexual activity: Yes    Partners: Male    Birth control/protection: Condom  Other Topics Concern  . Not on file  Social History Narrative   Caffeine use:  Yes   Regular exercise:  Yes   2 pregnancies = 2 live births, 1 son and 1 daughter   From [place], in [place] since 2006   Social Determinants of Health   Financial Resource Strain:   . Difficulty of Paying Living Expenses: Not on file  Food Insecurity:   . Worried About Charity fundraiser in the Last Year: Not on file  . Ran Out of Food in the Last Year: Not on file  Transportation Needs:   . Lack of Transportation (Medical): Not on file  . Lack of Transportation (Non-Medical): Not on file  Physical Activity:   . Days of Exercise per Week: Not on file  . Minutes of Exercise per Session: Not on file  Stress:   . Feeling of Stress : Not on file  Social Connections:   . Frequency of Communication with Friends and Family: Not on file  . Frequency of Social Gatherings with Friends and Family: Not on file  . Attends Religious Services: Not on file  . Active Member of Clubs or Organizations: Not on file  . Attends Archivist Meetings: Not on file  . Marital Status: Not on file  Intimate Partner Violence:   . Fear of Current or Ex-Partner: Not on file  . Emotionally Abused: Not on file  . Physically Abused: Not on file  . Sexually  Abused: Not on file      Review of Systems  Vital Signs: There were no vitals taken for this visit.   Assessment/Plan: #1 eosinophilic pneumonia she is on steroids she has been down to 20 mg of prednisone.  Currently on clear if this is an exacerbation of her eosinophilic pneumonia or if she is actually  having a acute bronchitis or possibly a viral bronchitis.  She has no exposure to anybody with Covid that she is aware of and she is having some shortness of breath but not very significant from her baseline.  2.  Cough and shortness of breath the patient is a little bit more symptomatic than her baseline it appears recommended that she increase her prednisone to 40 mg a day for the next week.  Also will go ahead and send her a prescription for azithromycin  3.  Bronchiectasis she may be having an acute exacerbation as again we will go ahead and send her a prescription for azithromycin and in addition to this she is going to increase her prednisone to 40 mg a day.  I counseled her explained to her that if her symptoms worsen she needs to immediately get medical attention and she may call our office back or she may go straight to the hospital.  If her symptoms do not resolve recommended that she get a Covid test done immediately.  In addition she should monitor her oxygenation closely.  She will follow-up with [place] on Monday  General Counseling: aianna fahs understanding of the findings of today's phone visit and agrees with plan of treatment. I have discussed any further diagnostic evaluation that may be needed or ordered today. We also reviewed her medications today. she has been encouraged to call the office with any questions or concerns that should arise related to todays visit.    No orders of the defined types were placed in this encounter.   No orders of the defined types were placed in this encounter.   Time spent:25 [place] MD [facility] Pulmonary  Medicine

## 2019-11-03 ENCOUNTER — Telehealth: Payer: Self-pay

## 2019-11-03 NOTE — Telephone Encounter (Signed)
Called lmom informing patient of virtual visit. klh 

## 2019-11-07 ENCOUNTER — Other Ambulatory Visit: Payer: Self-pay

## 2019-11-07 ENCOUNTER — Ambulatory Visit: Payer: 59 | Admitting: Internal Medicine

## 2019-11-07 ENCOUNTER — Encounter: Payer: Self-pay | Admitting: Internal Medicine

## 2019-11-07 VITALS — BP 121/79 | HR 96 | Temp 96.9°F | Resp 16 | Ht 62.0 in | Wt 218.0 lb

## 2019-11-07 DIAGNOSIS — J8281 Chronic eosinophilic pneumonia: Secondary | ICD-10-CM | POA: Diagnosis not present

## 2019-11-07 DIAGNOSIS — R0602 Shortness of breath: Secondary | ICD-10-CM | POA: Diagnosis not present

## 2019-11-07 DIAGNOSIS — J17 Pneumonia in diseases classified elsewhere: Secondary | ICD-10-CM

## 2019-11-07 DIAGNOSIS — F1920 Other psychoactive substance dependence, uncomplicated: Secondary | ICD-10-CM | POA: Diagnosis not present

## 2019-11-07 DIAGNOSIS — J301 Allergic rhinitis due to pollen: Secondary | ICD-10-CM

## 2019-11-07 DIAGNOSIS — B49 Unspecified mycosis: Secondary | ICD-10-CM

## 2019-11-07 NOTE — Progress Notes (Signed)
[facility] [place], [place] [address]  Pulmonary Sleep Medicine   Office Visit Note  Patient Name: [name] DOB: [birth year] MRN [id number]  Date of Service: 11/07/2019  Complaints/HPI: Pt is seen for pulmonary follow up at this time. Overall she is doing well.  At her last visit her prednisone was increased and a z pak was given.  She has also started voriconazole for eosinophil pneumonia.  She started this on the 1st of january due to difficulty obtaining it.  She is stepping back down to 20 mg of prednisone today (her taper is over) and she will see how she does at this time.  Her z pak will also be complete  Today also.  She denies any further issues.      ROS  General: (-) fever, (-) chills, (-) night sweats, (-) weakness Skin: (-) rashes, (-) itching,. Eyes: (-) visual changes, (-) redness, (-) itching. Nose and Sinuses: (-) nasal stuffiness or itchiness, (-) postnasal drip, (-) nosebleeds, (-) sinus trouble. Mouth and Throat: (-) sore throat, (-) hoarseness. Neck: (-) swollen glands, (-) enlarged thyroid, (-) neck pain. Respiratory: - cough, (-) bloody sputum, - shortness of breath, - wheezing. Cardiovascular: - ankle swelling, (-) chest pain. Lymphatic: (-) lymph node enlargement. Neurologic: (-) numbness, (-) tingling. Psychiatric: (-) anxiety, (-) depression   Current Medication: Outpatient Encounter Medications as of 11/07/2019  Medication Sig  . albuterol (PROVENTIL HFA;VENTOLIN HFA) 108 (90 Base) MCG/ACT inhaler Inhale 2 puffs into the lungs every 4 (four) hours as needed for wheezing or shortness of breath.  Marland Kitchen azithromycin (ZITHROMAX) 250 MG tablet As directed  . levothyroxine (SYNTHROID) 125 MCG tablet TAKE 1 TABLET BY MOUTH ONCE DAILY EXCEPT TAKE 1 AND 1/2 TABLETS ON SUNDAYS  . predniSONE (DELTASONE) 5 MG tablet Take as directed  . sulfamethoxazole-trimethoprim (BACTRIM DS) 800-160 MG tablet Take 1 tab po Monday,wednesday  and  Friday  . voriconazole (VFEND) 200 MG tablet Take 1 tablet (200 mg total) by mouth 2 (two) times daily.   No facility-administered encounter medications on file as of 11/07/2019.    Surgical History: Past Surgical History:  Procedure Laterality Date  . CESAREAN SECTION  2007  . CHOLECYSTECTOMY N/A 10/27/2016   Procedure: LAPAROSCOPIC CHOLECYSTECTOMY;  Surgeon: Jules Husbands, MD;  Location: [facility] ORS;  Service: General;  Laterality: N/A;  . DIAGNOSTIC LAPAROSCOPY  2018   X2  . FLEXIBLE BRONCHOSCOPY N/A 05/03/2019   Procedure: FLEXIBLE BRONCHOSCOPY;  Surgeon: Allyne Gee, MD;  Location: [facility] ORS;  Service: Pulmonary;  Laterality: N/A;  . LUNG BIOPSY Right 2008   partial right lung resection.  was treated with steroids x 1 year  . TONSILLECTOMY AND ADENOIDECTOMY      Medical History: Past Medical History:  Diagnosis Date  . Anemia    Iron Deficiency in the past  . Asthma    in childhood  . Bowen's disease   . Complication of anesthesia    HARD TO WAKE UP SOMETIIMES  . Dyspnea 04/2019   sob and coughing has increased of late  . Endometriosis    dx'd in high school  . Eosinophilic pneumonia ~17/6160   per Dr. Clayborn Bigness s/p VATS and 1 year of prednisone  . GERD (gastroesophageal reflux disease)    TUMS PRN  . Headache    MIGRAINES without aura  . History of abnormal Pap smear   . History of chicken pox   . Hypothyroidism    dx'd 2011  .  Pneumonia 2009/ 3818   EOSINOPHILIC PNEUMONIA  . PONV (postoperative nausea and vomiting)   . Seizures ([place])    childhood-previously on Tegretol-been off meds for years with no seizures  . Urinary tract bacterial infections     Family History: Family History  Problem Relation Age of Onset  . Mental illness Mother        bipolar  . Cancer Father        h/o throat cancer- treated  . Heart disease Maternal Grandfather        at ate >60  . Alcohol abuse Maternal Grandfather   . Stomach cancer Neg Hx   . Rectal cancer Neg Hx   .  Esophageal cancer Neg Hx   . Colon cancer Neg Hx   . Breast cancer Neg Hx     Social History: Social History   Socioeconomic History  . Marital status: Married    Spouse name: cory  . Number of children: 2  . Years of education: Not on file  . Highest education level: Not on file  Occupational History  . Occupation: Passenger transport manager    Comment: currently working  Tobacco Use  . Smoking status: Former Smoker    Packs/day: 0.50    Years: 10.00    Pack years: 5.00    Types: Cigarettes    Quit date: 10/21/2004    Years since quitting: 15.0  . Smokeless tobacco: Never Used  Substance and Sexual Activity  . Alcohol use: Yes    Comment: rare  . Drug use: No  . Sexual activity: Yes    Partners: Male    Birth control/protection: Condom  Other Topics Concern  . Not on file  Social History Narrative   Caffeine use:  Yes   Regular exercise:  Yes   2 pregnancies = 2 live births, 1 son and 1 daughter   From [place], in [place] since 2006   Social Determinants of Health   Financial Resource Strain:   . Difficulty of Paying Living Expenses: Not on file  Food Insecurity:   . Worried About Charity fundraiser in the Last Year: Not on file  . Ran Out of Food in the Last Year: Not on file  Transportation Needs:   . Lack of Transportation (Medical): Not on file  . Lack of Transportation (Non-Medical): Not on file  Physical Activity:   . Days of Exercise per Week: Not on file  . Minutes of Exercise per Session: Not on file  Stress:   . Feeling of Stress : Not on file  Social Connections:   . Frequency of Communication with Friends and Family: Not on file  . Frequency of Social Gatherings with Friends and Family: Not on file  . Attends Religious Services: Not on file  . Active Member of Clubs or Organizations: Not on file  . Attends Archivist Meetings: Not on file  . Marital Status: Not on file  Intimate Partner Violence:   . Fear of Current or Ex-Partner: Not on file  .  Emotionally Abused: Not on file  . Physically Abused: Not on file  . Sexually Abused: Not on file    Vital Signs: Blood pressure 121/79, pulse 96, temperature (!) 96.9 F (36.1 C), resp. rate 16, height _0  (1.575 m), weight 218 lb (98.9 kg), SpO2 96 %.  Examination: General Appearance: The patient is well-developed, well-nourished, and in no distress. Skin: Gross inspection of skin unremarkable. Head: normocephalic, no gross deformities. Eyes: no  gross deformities noted. ENT: ears appear grossly normal no exudates. Neck: Supple. No thyromegaly. No LAD. Respiratory: clear bilaterally. Cardiovascular: Normal S1 and S2 without murmur or rub. Extremities: No cyanosis. pulses are equal. Neurologic: Alert and oriented. No involuntary movements.  LABS: Recent Results (from the past 2160 hour(s))  Comprehensive metabolic panel     Status: Abnormal   Collection Time: 10/10/19  1:35 PM  Result Value Ref Range   Sodium 134 (L) 135 - 145 mmol/L   Potassium 3.1 (L) 3.5 - 5.1 mmol/L   Chloride 102 98 - 111 mmol/L   CO2 25 22 - 32 mmol/L   Glucose, Bld 93 70 - 99 mg/dL   BUN 13 6 - 20 mg/dL   Creatinine, Ser 0.66 0.44 - 1.00 mg/dL   Calcium 9.0 8.9 - 10.3 mg/dL   Total Protein 7.0 6.5 - 8.1 g/dL   Albumin 3.8 3.5 - 5.0 g/dL   AST 20 15 - 41 U/L   ALT 22 0 - 44 U/L   Alkaline Phosphatase 39 38 - 126 U/L   Total Bilirubin 0.6 0.3 - 1.2 mg/dL   GFR calc non Af Amer >60 >60 mL/min   GFR calc Af Amer >60 >60 mL/min   Anion gap 7 5 - 15    Comment: Performed at [facility], [address] [place], [place] [address]  TSH     Status: None   Collection Time: 10/10/19  1:35 PM  Result Value Ref Range   TSH 2.919 0.350 - 4.500 uIU/mL    Comment: Performed by a 3rd Generation assay with a functional sensitivity of <=0.01 uIU/mL. Performed at [facility], [address], [place], [place] [address]     Radiology: No results found.  No results found.  No  results found.    Assessment and Plan: Patient Active Problem List   Diagnosis Date Noted  . Routine general medical examination at a health care facility 05/08/2019  . Lower respiratory infection 01/26/2019  . Lumbar radicular pain 12/21/2017  . Advance care planning 05/04/2017  . Calculus of gallbladder without cholecystitis without obstruction 08/05/2016  . Migraine without aura 04/18/2012  . Cough 02/06/2012  . Eosinophilic pneumonia 75/91/6384  . Hypothyroidism 02/07/2011    1. Eosinophilic pneumonia Continues on prednisone.  Improving currently, would like to taper down.  Currently on 71m.   2. Fungal pneumonia Pt has been on voriconazole for 18 days.   3. Steroid dependent (HCC) Continue to taper steroids down to 15 mg in 2 weeks.  If no issues, stay at that does until next visit.   4. Seasonal allergic rhinitis due to pollen Stable, continue present management.   5. Shortness of breath Stable, FEV1 is 2.2 which is 76% of pre-predicted value.  - Spirometry with Graph  General Counseling: I have discussed the findings of the evaluation and examination with JAnderson Malta  I have also discussed any further diagnostic evaluation thatmay be needed or ordered today. Zyonna verbalizes understanding of the findings of todays visit. We also reviewed her medications today and discussed drug interactions and side effects including but not limited excessive drowsiness and altered mental states. We also discussed that there is always a risk not just to her but also people around her. she has been encouraged to call the office with any questions or concerns that should arise related to todays visit.  Orders Placed This Encounter  Procedures  . Spirometry with Graph    Order Specific Question:  Where should this test be performed?    Answer:   [place]     Time spent: 25 This patient was seen by Orson Gear AGNP-C in Collaboration with Dr. Devona Konig as a part of  collaborative care agreement.   I have personally obtained a history, examined the patient, evaluated laboratory and imaging results, formulated the assessment and plan and placed orders.    Allyne Gee, MD [facility] Pulmonary and Critical Care Sleep medicine

## 2019-11-22 ENCOUNTER — Ambulatory Visit
Admission: RE | Admit: 2019-11-22 | Discharge: 2019-11-22 | Disposition: A | Discharge status: Home / Self Care | Payer: 59 | Attending: Internal Medicine | Admitting: Internal Medicine

## 2019-11-22 ENCOUNTER — Encounter: Payer: Self-pay | Admitting: Internal Medicine

## 2019-11-22 ENCOUNTER — Other Ambulatory Visit: Payer: Self-pay | Admitting: Internal Medicine

## 2019-11-22 ENCOUNTER — Other Ambulatory Visit: Payer: Self-pay

## 2019-11-22 ENCOUNTER — Ambulatory Visit: Payer: 59 | Admitting: Internal Medicine

## 2019-11-22 ENCOUNTER — Ambulatory Visit
Admission: RE | Admit: 2019-11-22 | Discharge: 2019-11-22 | Disposition: A | Discharge status: Home / Self Care | Payer: 59 | Source: Ambulatory Visit | Attending: Internal Medicine | Admitting: Internal Medicine

## 2019-11-22 VITALS — Ht 62.0 in | Wt 218.0 lb

## 2019-11-22 DIAGNOSIS — F1920 Other psychoactive substance dependence, uncomplicated: Secondary | ICD-10-CM | POA: Diagnosis not present

## 2019-11-22 DIAGNOSIS — J22 Unspecified acute lower respiratory infection: Secondary | ICD-10-CM | POA: Diagnosis not present

## 2019-11-22 DIAGNOSIS — J8281 Chronic eosinophilic pneumonia: Secondary | ICD-10-CM

## 2019-11-22 DIAGNOSIS — R0602 Shortness of breath: Secondary | ICD-10-CM | POA: Insufficient documentation

## 2019-11-22 MED ORDER — AZITHROMYCIN 250 MG PO TABS: ORAL_TABLET | ORAL | 2 refills | Status: DC

## 2019-11-22 NOTE — Patient Instructions (Signed)
Bronchiectasis  Bronchiectasis is a condition in which the airways in the lungs (bronchi) are damaged and widened. The condition makes it hard for the lungs to get rid of mucus, and it causes mucus to gather in the bronchi. This condition often leads to lung infections, which can make the condition worse. What are the causes? You can be born with this condition or you can develop it later in life. Common causes of this condition include:  Cystic fibrosis.  Repeated lung infections, such as pneumonia or tuberculosis.  An object or other blockage in the lungs.  Breathing in fluid, food, or other objects (aspiration).  A problem with the immune system and lung structure that is present at birth (congenital). Sometimes the cause is not known. What are the signs or symptoms? Common symptoms of this condition include:  A daily cough that brings up mucus and lasts for more than 3 weeks.  Lung infections that happen often.  Shortness of breath and wheezing.  Weakness and fatigue. How is this diagnosed? This condition is diagnosed with tests, such as:  Chest X-rays or CT scans. These are done to check for changes in the lungs.  Breathing tests. These are done to check how well your lungs are working.  A test of a sample of your saliva (sputum culture). This test is done to check for infection.  Blood tests and other tests. These are done to check for related diseases or causes. How is this treated? Treatment for this condition depends on the severity of the illness and its cause. Treatment may include:  Medicines that loosen mucus so it can be coughed up (expectorants).  Medicines that relax the muscles of the bronchi (bronchodilators).  Antibiotic medicines to prevent or treat infection.  Physical therapy to help clear mucus from the lungs. Techniques may include: ? Postural drainage. This is when you sit or lie in certain positions so that mucus can drain by gravity. ? Chest  percussion. This involves tapping the chest or back with a cupped hand. ? Chest vibration. For this therapy, a hand or special equipment vibrates your chest and back.  Surgery to remove the affected part of the lung. This may be done in severe cases. Follow these instructions at home: Medicines  Take over-the-counter and prescription medicines only as told by your health care provider.  If you were prescribed an antibiotic medicine, take it as told by your health care provider. Do not stop taking the antibiotic even if you start to feel better.  Avoid taking sedatives and antihistamines unless your health care provider tells you to take them. These medicines tend to thicken the mucus in the lungs. Managing symptoms  Perform breathing exercises or techniques to clear your lungs as told by your health care provider.  Consider using a cold steam vaporizer or humidifier in your room or home to help loosen secretions.  If you have a cough that gets worse at night, try sleeping in a semi-upright position. General instructions  Get plenty of rest.  Drink enough fluid to keep your urine clear or pale yellow.  Stay inside when pollution and ozone levels are high.  Stay up to date with vaccinations and immunizations.  Avoid cigarette smoke and other lung irritants.  Do not use any products that contain nicotine or tobacco, such as cigarettes and e-cigarettes. If you need help quitting, ask your health care provider.  Keep all follow-up visits as told by your health care provider. This is important. Contact   a health care provider if:  You cough up more sputum than before and the sputum is yellow or green in color.  You have a fever.  You cannot control your cough and are losing sleep. Get help right away if:  You cough up blood.  You have chest pain.  You have increasing shortness of breath.  You have pain that gets worse or is not controlled with medicines.  You have a fever  and your symptoms suddenly get worse. Summary  Bronchiectasis is a condition in which the airways in the lungs (bronchi) are damaged and widened. The condition makes it hard for the lungs to get rid of mucus, and it causes mucus to gather in the bronchi.  Treatment usually includes therapy to help clear mucus from the lungs.  Stay up to date with vaccinations and immunizations. This information is not intended to replace advice given to you by your health care provider. Make sure you discuss any questions you have with your health care provider. Document Revised: 09/18/2017 Document Reviewed: 11/10/2016 Elsevier Patient Education  2020 Elsevier Inc.  

## 2019-11-22 NOTE — Progress Notes (Signed)
[facility] [place], [place] [address]  Internal MEDICINE  Telephone Visit  Patient Name: [name]  [id number]  [id number]  Date of Service: 11/22/2019  I connected with the patient at 1500 by telephone and verified the patients identity using two identifiers.   I discussed the limitations, risks, security and privacy concerns of performing an evaluation and management service by telephone and the availability of in person appointments. I also discussed with the patient that there may be a patient responsible charge related to the service.  The patient expressed understanding and agrees to proceed.    Chief Complaint  Patient presents with  . Telephone Assessment    CHEST XRAY WAS DONE TODAY, ABOUT AN HOUR AGO   . Telephone Screen  . Shortness of Breath    CHEST TIGHTNESS  . Headache    HPI Patient scheduled an appointment today because she has been having ongoing issues with shortness of breath chest tightness.  Patient states that she was on the Zithromax and that actually helped her significantly.  As soon as she came off of the azithromycin she states that her symptoms started to return.  This is interesting as this might even indicate some type of atypical infection which we have not thought about so far.  I spoke with her at length and I did explain to her that I think because she is not improving as well as she should at this stage with the steroids and antibiotics that she has had we may need to get another tissue biopsy.  She agrees that she is also been concerned about doing the tissue biopsy to get a more definitive diagnosis however because of COVID-19 she is also very concerned that she does not want to be undergoing basically an elective procedure while the pandemic is raging at this point.  I did tell her that what is encouraging is that overall numbers are coming down related to the COVID-19 however I do agree with her and cannot fault her  for wanting to be extra cautious.  She denies having any fevers or chills.  She has tightness in the chest she states that she is got no headaches.  And she does also feel short of breath she denies having any exposure to COVID-19.    Current Medication: Outpatient Encounter Medications as of 11/22/2019  Medication Sig  . albuterol (PROVENTIL HFA;VENTOLIN HFA) 108 (90 Base) MCG/ACT inhaler Inhale 2 puffs into the lungs every 4 (four) hours as needed for wheezing or shortness of breath.  Marland Kitchen azithromycin (ZITHROMAX) 250 MG tablet As directed  . levothyroxine (SYNTHROID) 125 MCG tablet TAKE 1 TABLET BY MOUTH ONCE DAILY EXCEPT TAKE 1 AND 1/2 TABLETS ON SUNDAYS  . predniSONE (DELTASONE) 5 MG tablet Take as directed  . sulfamethoxazole-trimethoprim (BACTRIM DS) 800-160 MG tablet Take 1 tab po Monday,wednesday  and Friday  . voriconazole (VFEND) 200 MG tablet Take 1 tablet (200 mg total) by mouth 2 (two) times daily.   No facility-administered encounter medications on file as of 11/22/2019.    Surgical History: Past Surgical History:  Procedure Laterality Date  . CESAREAN SECTION  2007  . CHOLECYSTECTOMY N/A 10/27/2016   Procedure: LAPAROSCOPIC CHOLECYSTECTOMY;  Surgeon: Jules Husbands, MD;  Location: [facility] ORS;  Service: General;  Laterality: N/A;  . DIAGNOSTIC LAPAROSCOPY  2018   X2  . FLEXIBLE BRONCHOSCOPY N/A 05/03/2019   Procedure: FLEXIBLE BRONCHOSCOPY;  Surgeon: Allyne Gee, MD;  Location: [facility] ORS;  Service:  Pulmonary;  Laterality: N/A;  . LUNG BIOPSY Right 2008   partial right lung resection.  was treated with steroids x 1 year  . TONSILLECTOMY AND ADENOIDECTOMY      Medical History: Past Medical History:  Diagnosis Date  . Anemia    Iron Deficiency in the past  . Asthma    in childhood  . Bowen's disease   . Complication of anesthesia    HARD TO WAKE UP SOMETIIMES  . Dyspnea 04/2019   sob and coughing has increased of late  . Endometriosis    dx'd in high school  .  Eosinophilic pneumonia ~41/9622   per Dr. Clayborn Bigness s/p VATS and 1 year of prednisone  . GERD (gastroesophageal reflux disease)    TUMS PRN  . Headache    MIGRAINES without aura  . History of abnormal Pap smear   . History of chicken pox   . Hypothyroidism    dx'd 2011  . Pneumonia 2009/ 2979   EOSINOPHILIC PNEUMONIA  . PONV (postoperative nausea and vomiting)   . Seizures ([place])    childhood-previously on Tegretol-been off meds for years with no seizures  . Urinary tract bacterial infections     Family History: Family History  Problem Relation Age of Onset  . Mental illness Mother        bipolar  . Cancer Father        h/o throat cancer- treated  . Heart disease Maternal Grandfather        at ate >60  . Alcohol abuse Maternal Grandfather   . Stomach cancer Neg Hx   . Rectal cancer Neg Hx   . Esophageal cancer Neg Hx   . Colon cancer Neg Hx   . Breast cancer Neg Hx     Social History   Socioeconomic History  . Marital status: Married    Spouse name: cory  . Number of children: 2  . Years of education: Not on file  . Highest education level: Not on file  Occupational History  . Occupation: Passenger transport manager    Comment: currently working  Tobacco Use  . Smoking status: Former Smoker    Packs/day: 0.50    Years: 10.00    Pack years: 5.00    Types: Cigarettes    Quit date: 10/21/2004    Years since quitting: 15.0  . Smokeless tobacco: Never Used  Substance and Sexual Activity  . Alcohol use: Yes    Comment: rare  . Drug use: No  . Sexual activity: Yes    Partners: Male    Birth control/protection: Condom  Other Topics Concern  . Not on file  Social History Narrative   Caffeine use:  Yes   Regular exercise:  Yes   2 pregnancies = 2 live births, 1 son and 1 daughter   From [place], in [place] since 2006   Social Determinants of Health   Financial Resource Strain:   . Difficulty of Paying Living Expenses: Not on file  Food Insecurity:   . Worried About Paediatric nurse in the Last Year: Not on file  . Ran Out of Food in the Last Year: Not on file  Transportation Needs:   . Lack of Transportation (Medical): Not on file  . Lack of Transportation (Non-Medical): Not on file  Physical Activity:   . Days of Exercise per Week: Not on file  . Minutes of Exercise per Session: Not on file  Stress:   . Feeling of Stress :  Not on file  Social Connections:   . Frequency of Communication with Friends and Family: Not on file  . Frequency of Social Gatherings with Friends and Family: Not on file  . Attends Religious Services: Not on file  . Active Member of Clubs or Organizations: Not on file  . Attends Archivist Meetings: Not on file  . Marital Status: Not on file  Intimate Partner Violence:   . Fear of Current or Ex-Partner: Not on file  . Emotionally Abused: Not on file  . Physically Abused: Not on file  . Sexually Abused: Not on file      Review of Systems  Positive chest tightness positive shortness of breath positive cough No nausea no vomiting no diarrhea No ankle swelling No headaches No Covid exposure  Vital Signs: Ht _0  (1.575 m)   Wt 218 lb (98.9 kg)   BMI 39.87 kg/m    Observation/Objective: Basically patient appeared to be not in any distress when conversation was going on.    Assessment/Plan: 1.  Eosinophilic pneumonia my concern here is that she is obviously not improving as quickly as she should improve which would raise concerns of another process going on which could be infectious as well as noninfectious.  We discussed the possibility of doing another lung biopsy she is agreeable however she definitely did not want to undergo the lung biopsy at this time she would rather wait until the pandemic is slowing down.  We will therefore hold off to refer her for surgery.  In terms of where she would want to go she would prefer to go to [facility] for a biopsy.  2.  Possible atypical infection she has had a nice  response to azithromycin which could be due to its anti-inflammatory properties or it could be due to an atypical infection I have asked her to stop taking the fluconazole and we will put her on a protracted course of azithromycin.  Prescription will be sent for her.  3.  Bronchiectasis she could have ongoing infection going on with the bronchiectasis which has responded to the azithromycin so I think it is reasonable to put her on a prolonged course.  If she shows good response I may consider putting her on triple therapy with rifampin as well as ethambutol to cover for atypical Mycobacterium which could conceivably be pathogens in her case.  In reviewing the current chest x-ray I believe that it actually did look a little bit better when compared to her previous film from May.   General Counseling: desta bujak understanding of the findings of today's phone visit and agrees with plan of treatment. I have discussed any further diagnostic evaluation that may be needed or ordered today. We also reviewed her medications today. she has been encouraged to call the office with any questions or concerns that should arise related to todays visit.    Orders Placed This Encounter  Procedures  . DG Chest 2 View    Meds ordered this encounter  Medications  . azithromycin (ZITHROMAX) 250 MG tablet    Sig: As directed    Dispense:  21 tablet    Refill:  2    Time spent: 15 minutes    Allyne Gee MD [facility] Pulmonary Medicine

## 2019-11-24 ENCOUNTER — Other Ambulatory Visit: Payer: Self-pay

## 2019-11-24 DIAGNOSIS — J22 Unspecified acute lower respiratory infection: Secondary | ICD-10-CM

## 2019-11-24 MED ORDER — AZITHROMYCIN 250 MG PO TABS: ORAL_TABLET | ORAL | 2 refills | Status: DC

## 2019-12-08 ENCOUNTER — Other Ambulatory Visit: Payer: Self-pay | Admitting: Internal Medicine

## 2019-12-08 DIAGNOSIS — J479 Bronchiectasis, uncomplicated: Secondary | ICD-10-CM

## 2019-12-08 NOTE — Telephone Encounter (Signed)
Can you check this which dose we have to send otherwise I call pt

## 2019-12-12 ENCOUNTER — Encounter: Payer: Self-pay | Admitting: Internal Medicine

## 2019-12-12 ENCOUNTER — Other Ambulatory Visit: Payer: Self-pay

## 2019-12-12 ENCOUNTER — Ambulatory Visit: Payer: 59 | Admitting: Internal Medicine

## 2019-12-12 VITALS — BP 108/70 | HR 76 | Temp 97.3°F | Resp 16 | Ht 62.0 in | Wt 226.0 lb

## 2019-12-12 DIAGNOSIS — F1920 Other psychoactive substance dependence, uncomplicated: Secondary | ICD-10-CM

## 2019-12-12 DIAGNOSIS — J8281 Chronic eosinophilic pneumonia: Secondary | ICD-10-CM

## 2019-12-12 DIAGNOSIS — J479 Bronchiectasis, uncomplicated: Secondary | ICD-10-CM

## 2019-12-12 DIAGNOSIS — R0602 Shortness of breath: Secondary | ICD-10-CM | POA: Diagnosis not present

## 2019-12-12 NOTE — Progress Notes (Signed)
[facility] [place],  [id number]  Pulmonary Sleep Medicine   Office Visit Note  Patient Name: [name] DOB: 09-17-1981 MRN [id number]  Date of Service: 12/12/2019  Complaints/HPI: On prednisone 2m now at this time.  She continues to have some issues with shortness of breath.  She feels like she is not improving despite having been on high-dose steroids.  Spoke to her at length I think it is time now to consider doing very biopsy so that we can make sure that this is not another pathology that may need to be treated.  I did speak to her also about empirically treating her with Biologics in case of chronic eosinophilic pneumonia however I think that she and I both agree that I think a tissue diagnosis is now necessary to make certain that this is not something else like cryptogenic organizing pneumonia or bronchiolitis as well as an atypical infection.  The best source of tissue will probably be an open lung biopsy and [facility]  Prior to going to [facility] I am going to get a follow-up high-resolution CT scan on her.  ROS  General: (-) fever, (-) chills, (-) night sweats, (-) weakness Skin: (-) rashes, (-) itching,. Eyes: (-) visual changes, (-) redness, (-) itching. Nose and Sinuses: (-) nasal stuffiness or itchiness, (-) postnasal drip, (-) nosebleeds, (-) sinus trouble. Mouth and Throat: (-) sore throat, (-) hoarseness. Neck: (-) swollen glands, (-) enlarged thyroid, (-) neck pain. Respiratory: - cough, (-) bloody sputum, + shortness of breath, - wheezing. Cardiovascular: - ankle swelling, (-) chest pain. Lymphatic: (-) lymph node enlargement. Neurologic: (-) numbness, (-) tingling. Psychiatric: (-) anxiety, (-) depression   Current Medication: Outpatient Encounter Medications as of 12/12/2019  Medication Sig  . albuterol (PROVENTIL HFA;VENTOLIN HFA) 108 (90  Base) MCG/ACT inhaler Inhale 2 puffs into the lungs every 4 (four) hours as needed for wheezing or shortness of breath.  .Marland Kitchenazithromycin (ZITHROMAX) 250 MG tablet Take 1 tab po daily  . levothyroxine (SYNTHROID) 125 MCG tablet TAKE 1 TABLET BY MOUTH ONCE DAILY EXCEPT TAKE 1 AND 1/2 TABLETS ON SUNDAYS  . predniSONE (DELTASONE) 20 MG tablet Take 20 mg by mouth daily with breakfast.   . predniSONE (DELTASONE) 5 MG tablet Take as directed  . voriconazole (VFEND) 200 MG tablet Take 1 tablet (200 mg total) by mouth 2 (two) times daily.  . [DISCONTINUED] sulfamethoxazole-trimethoprim (BACTRIM DS) 800-160 MG tablet Take 1 tab po Monday,wednesday  and Friday   No facility-administered encounter medications on file as of 12/12/2019.    Surgical History: Past Surgical History:  Procedure Laterality Date  . CESAREAN SECTION  2007  . CHOLECYSTECTOMY N/A 10/27/2016   Procedure: LAPAROSCOPIC CHOLECYSTECTOMY;  Surgeon: DJules Husbands MD;  Location: [facility] ORS;  Service: General;  Laterality: N/A;  . DIAGNOSTIC LAPAROSCOPY  2018   X2  . FLEXIBLE BRONCHOSCOPY N/A 05/03/2019   Procedure: FLEXIBLE BRONCHOSCOPY;  Surgeon: KAllyne Gee MD;  Location: [facility] ORS;  Service: Pulmonary;  Laterality: N/A;  . LUNG BIOPSY Right 2008   partial right lung resection.  was treated with steroids x 1 year  . TONSILLECTOMY AND ADENOIDECTOMY      Medical History: Past Medical History:  Diagnosis Date  . Anemia    Iron Deficiency in the past  . Asthma    in childhood  . Bowen's disease   . Complication of anesthesia  HARD TO WAKE UP SOMETIIMES  . Dyspnea 04/2019   sob and coughing has increased of late  . Endometriosis    dx'd in high school  . Eosinophilic pneumonia ~58/0998   per Dr. Clayborn Bigness s/p VATS and 1 year of prednisone  . GERD (gastroesophageal reflux disease)    TUMS PRN  . Headache    MIGRAINES without aura  . History of abnormal Pap smear   . History of chicken pox   . Hypothyroidism    dx'd  2011  . Pneumonia 2009/ 3382   EOSINOPHILIC PNEUMONIA  . PONV (postoperative nausea and vomiting)   . Seizures ([place])    childhood-previously on Tegretol-been off meds for years with no seizures  . Urinary tract bacterial infections     Family History: Family History  Problem Relation Age of Onset  . Mental illness Mother        bipolar  . Cancer Father        h/o throat cancer- treated  . Heart disease Maternal Grandfather        at ate >60  . Alcohol abuse Maternal Grandfather   . Stomach cancer Neg Hx   . Rectal cancer Neg Hx   . Esophageal cancer Neg Hx   . Colon cancer Neg Hx   . Breast cancer Neg Hx     Social History: Social History   Socioeconomic History  . Marital status: Married    Spouse name: cory  . Number of children: 2  . Years of education: Not on file  . Highest education level: Not on file  Occupational History  . Occupation: Passenger transport manager    Comment: currently working  Tobacco Use  . Smoking status: Former Smoker    Packs/day: 0.50    Years: 10.00    Pack years: 5.00    Types: Cigarettes    Quit date: 10/21/2004    Years since quitting: 15.1  . Smokeless tobacco: Never Used  Substance and Sexual Activity  . Alcohol use: Yes    Comment: rare  . Drug use: No  . Sexual activity: Yes    Partners: Male    Birth control/protection: Condom  Other Topics Concern  . Not on file  Social History Narrative   Caffeine use:  Yes   Regular exercise:  Yes   2 pregnancies = 2 live births, 1 son and 1 daughter   From [place], in [place] since 2006   Social Determinants of Health   Financial Resource Strain:   . Difficulty of Paying Living Expenses: Not on file  Food Insecurity:   . Worried About Charity fundraiser in the Last Year: Not on file  . Ran Out of Food in the Last Year: Not on file  Transportation Needs:   . Lack of Transportation (Medical): Not on file  . Lack of Transportation (Non-Medical): Not on file  Physical Activity:   . Days of  Exercise per Week: Not on file  . Minutes of Exercise per Session: Not on file  Stress:   . Feeling of Stress : Not on file  Social Connections:   . Frequency of Communication with Friends and Family: Not on file  . Frequency of Social Gatherings with Friends and Family: Not on file  . Attends Religious Services: Not on file  . Active Member of Clubs or Organizations: Not on file  . Attends Archivist Meetings: Not on file  . Marital Status: Not on file  Intimate Partner  Violence:   . Fear of Current or Ex-Partner: Not on file  . Emotionally Abused: Not on file  . Physically Abused: Not on file  . Sexually Abused: Not on file    Vital Signs: Blood pressure 108/70, pulse 76, temperature (!) 97.3 F (36.3 C), resp. rate 16, height _0  (1.575 m), weight 226 lb (102.5 kg), SpO2 97 %.  Examination: General Appearance: The patient is well-developed, well-nourished, and in no distress. Skin: Gross inspection of skin unremarkable. Head: normocephalic, no gross deformities. Eyes: no gross deformities noted. ENT: ears appear grossly normal no exudates. Neck: Supple. No thyromegaly. No LAD. Respiratory: no rhonchi noted today. Cardiovascular: Normal S1 and S2 without murmur or rub. Extremities: No cyanosis. pulses are equal. Neurologic: Alert and oriented. No involuntary movements.  LABS: Recent Results (from the past 2160 hour(s))  Comprehensive metabolic panel     Status: Abnormal   Collection Time: 10/10/19  1:35 PM  Result Value Ref Range   Sodium 134 (L) 135 - 145 mmol/L   Potassium 3.1 (L) 3.5 - 5.1 mmol/L   Chloride 102 98 - 111 mmol/L   CO2 25 22 - 32 mmol/L   Glucose, Bld 93 70 - 99 mg/dL   BUN 13 6 - 20 mg/dL   Creatinine, Ser 0.66 0.44 - 1.00 mg/dL   Calcium 9.0 8.9 - 10.3 mg/dL   Total Protein 7.0 6.5 - 8.1 g/dL   Albumin 3.8 3.5 - 5.0 g/dL   AST 20 15 - 41 U/L   ALT 22 0 - 44 U/L   Alkaline Phosphatase 39 38 - 126 U/L   Total Bilirubin 0.6 0.3 - 1.2  mg/dL   GFR calc non Af Amer >60 >60 mL/min   GFR calc Af Amer >60 >60 mL/min   Anion gap 7 5 - 15    Comment: Performed at [facility], [address], [place], [place] [address]  TSH     Status: None   Collection Time: 10/10/19  1:35 PM  Result Value Ref Range   TSH 2.919 0.350 - 4.500 uIU/mL    Comment: Performed by a 3rd Generation assay with a functional sensitivity of <=0.01 uIU/mL. Performed at [facility], [address] [place], [place] [address]     Radiology: DG Chest 2 View  Result Date: 11/22/2019 CLINICAL DATA:  Nonproductive cough and shortness of breath for 1 week. EXAM: CHEST - 2 VIEW COMPARISON:  05/03/2019 FINDINGS: The heart size and mediastinal contours are within normal limits. Bibasilar scarring is again seen with elevation of the right hemidiaphragm. No evidence of acute infiltrate or edema. No evidence of pleural effusion. IMPRESSION: Stable bibasilar scarring. No active disease. Electronically Signed   By: Marlaine Hind M.D.   On: 11/22/2019 20:35    No results found.  DG Chest 2 View  Result Date: 11/22/2019 CLINICAL DATA:  Nonproductive cough and shortness of breath for 1 week. EXAM: CHEST - 2 VIEW COMPARISON:  05/03/2019 FINDINGS: The heart size and mediastinal contours are within normal limits. Bibasilar scarring is again seen with elevation of the right hemidiaphragm. No evidence of acute infiltrate or edema. No evidence of pleural effusion. IMPRESSION: Stable bibasilar scarring. No active disease. Electronically Signed   By: Marlaine Hind M.D.   On: 11/22/2019 20:35      Assessment and Plan: Patient Active Problem List   Diagnosis Date Noted  . Routine general medical examination at a health care facility 05/08/2019  . Lower respiratory infection  01/26/2019  . Lumbar radicular pain 12/21/2017  . Advance care planning 05/04/2017  . Calculus of gallbladder without cholecystitis without obstruction 08/05/2016  . Migraine without  aura 04/18/2012  . Cough 02/06/2012  . Eosinophilic pneumonia 39/76/7341  . Hypothyroidism 02/07/2011    1. Eosinophilic pneumonia we had empirically been treating her with prednisone for the prior diagnosis of eosinophilic pneumonia was felt to be a relapse of the same condition however she is not showing very good response.  This makes me think that she has an alternate diagnosis potentially and therefore she should have a tissue biopsy done and I am going to go ahead and refer her to cardiothoracic surgery for evaluation and possible biopsy. 2. Steroid-dependent I gave her the option of trying steroid sparing medications discussing that there is potential for serious side effects and also the fact that we do not really know her precise diagnosis.  She is hesitant at this time to try any further empiric therapy 3. Bronchiectasis she is on azithromycin at this time this will be continued. 4. Shortness of breath has had minimal improvement we will continue to follow  General Counseling: I have discussed the findings of the evaluation and examination with Anderson Malta.  I have also discussed any further diagnostic evaluation thatmay be needed or ordered today. Jamie-Lee verbalizes understanding of the findings of todays visit. We also reviewed her medications today and discussed drug interactions and side effects including but not limited excessive drowsiness and altered mental states. We also discussed that there is always a risk not just to her but also people around her. she has been encouraged to call the office with any questions or concerns that should arise related to todays visit.  Orders Placed This Encounter  Procedures  . CT Chest High Resolution    Standing Status:   Future    Standing Expiration Date:   02/08/2021    Order Specific Question:   Is patient pregnant?    Answer:   No    Order Specific Question:   Preferred imaging location?    Answer:   [facility]    Order Specific  Question:   Radiology Contrast Protocol - do NOT remove file path    Answer:   [email or web address]  . Ambulatory referral to Cardiothoracic Surgery    Referral Priority:   Routine    Referral Type:   Surgical    Referral Reason:   Specialty Services Required    Requested Specialty:   Cardiothoracic Surgery    Number of Visits Requested:   1     Time spent: 30  I have personally obtained a history, examined the patient, evaluated laboratory and imaging results, formulated the assessment and plan and placed orders.    Allyne Gee, MD [facility] Pulmonary and Critical Care Sleep medicine

## 2019-12-12 NOTE — Patient Instructions (Signed)
Lung Biopsy  A lung biopsy is a procedure to remove a tissue sample from the lung. The tissue can be examined under a microscope to help diagnose various lung disorders. There are three types of lung biopsies:  Needle biopsy. In this type, the sample is removed with a needle.  Bronchoscopy. In this type, the sample is removed through a flexible tube inserted into the lungs (bronchoscope).  Open biopsy. In this type, the sample is removed through an incision made in the chest. Tell a health care provider about:  Any allergies you have.  All medicines you are taking, including vitamins, herbs, eye drops, creams, and over-the-counter medicines.  Any problems you or family members have had with anesthetic medicines.  Any blood disorders or bleeding problems that you have.  Any surgeries you have had.  Any medical conditions you have.  Whether you are pregnant or may be pregnant. What are the risks? Generally, this is a safe procedure. However, problems may occur, including:  Collapsed lung.  Bleeding.  Infection.  Pain. What happens before the procedure? Staying hydrated Follow instructions from your health care provider about hydration, which may include:  Up to 2 hours before the procedure - you may continue to drink clear liquids, such as water, clear fruit juice, black coffee, and plain tea. Eating and drinking restrictions Follow instructions from your health care provider about eating and drinking, which may include:  8 hours before the procedure - stop eating heavy meals or foods such as meat, fried foods, or fatty foods.  6 hours before the procedure - stop eating light meals or foods, such as toast or cereal.  6 hours before the procedure - stop drinking milk or drinks that contain milk.  2 hours before the procedure - stop drinking clear liquids. General instructions  Ask your health care provider about: ? Changing or stopping your regular medicines. This is  especially important if you take diabetes medicines or blood thinners. ? Taking medicines such as aspirin and ibuprofen. These medicines can thin your blood. Do not take these medicines before your procedure if your health care provider instructs you not to.  Plan to have someone take you home from the hospital or clinic. What happens during the procedure?  To lower your risk of infection: ? Your health care team will wash or sanitize their hands. ? If you are having an open biopsy, your skin will be washed with soap.  An IV tube may be inserted into one of your veins.  You may be given one or both of the following: ? A medicine to help you relax (sedative) during the procedure. ? A medicine to numb the area where the biopsy sample will be taken (local anesthetic). ? A medicine to make you sleep through the procedure (general anesthetic).  If you have a needle biopsy: ? A biopsy needle will be inserted into your lung. A CT scanner may be used to guide the needle to the right place. ? The needle will be used to collect the tissue sample.  If you have bronchoscopy: ? A bronchoscope will be inserted into your lungs through your mouth or nose. ? A needle or forceps will be passed through the bronchoscope to remove the tissue sample.  If you have an open biopsy: ? An incision will be made in your chest. ? The tissue sample will be removed using surgical tools. ? The incision will be closed with skin glue, skin adhesive strips, or stitches (sutures). The  procedure may vary among health care providers and hospitals. What happens after the procedure?  Your blood pressure, heart rate, breathing rate, and blood oxygen level will be monitored until the medicines you were given have worn off.  If a needle biopsy was performed, a bandage (dressing) will be applied over the area where the needle was inserted. You may be asked to apply pressure to the bandage for several minutes to ensure there is  minimal bleeding.  If a bronchoscope was used, you may have a cough and some soreness in your throat.  Do not drive for 24 hours if you were given a sedative. Summary  A lung biopsy is a procedure to remove a tissue sample from your lung. The sample is examined to help diagnose various lung disorders.  A lung biopsy may be done using a needle, by inserting a tube into the lungs, or through an incision in the chest.  After your lung biopsy, you may have a cough and some throat soreness. This information is not intended to replace advice given to you by your health care provider. Make sure you discuss any questions you have with your health care provider. Document Revised: 09/18/2017 Document Reviewed: 08/27/2016 Elsevier Patient Education  2020 [employer].

## 2019-12-20 ENCOUNTER — Other Ambulatory Visit: Payer: Self-pay | Admitting: *Deleted

## 2019-12-20 ENCOUNTER — Institutional Professional Consult (permissible substitution) (INDEPENDENT_AMBULATORY_CARE_PROVIDER_SITE_OTHER): Payer: 59 | Admitting: Cardiothoracic Surgery

## 2019-12-20 ENCOUNTER — Telehealth: Payer: Self-pay

## 2019-12-20 ENCOUNTER — Other Ambulatory Visit: Payer: Self-pay

## 2019-12-20 VITALS — BP 122/88 | HR 100 | Temp 97.7°F | Resp 20 | Ht 62.0 in | Wt 226.0 lb

## 2019-12-20 DIAGNOSIS — J849 Interstitial pulmonary disease, unspecified: Secondary | ICD-10-CM

## 2019-12-20 NOTE — Telephone Encounter (Signed)
Left a message and asked pt to call back regarding ct scan and referral. [facility]

## 2019-12-20 NOTE — Progress Notes (Signed)
[address]       [place] [address]             [place] Record #[id number] Date of Birth: 06-29-1981  Referring: [name], MD Primary Care: [name], MD Primary Cardiologist: No primary care provider on file.  Chief Complaint:    Chief Complaint  Patient presents with  . Interstitial Lung Disease    Surgical eval, Chest CT 09/26/20    History of Present Illness:    [name] 39 y.o. female is seen in the office  today at the request of Dr. Humphrey Rolls consider repeat lung biopsy.  In February 2009 the patient presented with severe shortness of breath and a CT scan consistent with interstitial pneumonitis.  At that time Dr. Arlyce Dice did a right video-assisted thoracoscopy with biopsy of the right upper middle and lower lobes.  Eosinophilic pneumonitis was diagnosed, the patient was treated with steroids at that time and significantly improved.  Her symptoms subsided until March 2020 when she developed flulike and pneumonia symptoms.  She was started on high-dose steroids which have been tapered down to 15 mg prednisone a day now.  Overall the patient notes she has felt better, but CT scans 2 months ago to in some months ago have not shown much improvement in interstitial disease.   The patient has a high-resolution CT scan scheduled for March 12.   Current Activity/ Functional Status:  Patient is independent with mobility/ambulation, transfers, ADL's, IADL's.   Zubrod Score: At the time of surgery this patient's most appropriate activity status/level should be described as: _0     0    Normal activity, no symptoms _1     1    Restricted in physical strenuous activity but ambulatory, able to do out light work _2     2    Ambulatory and capable of self care, unable to do work activities, up and about               >50 % of waking hours                              _3     3    Only  limited self care, in bed greater than 50% of waking hours _4     4    Completely disabled, no self care, confined to bed or chair _5     5    Moribund   Past Medical History:  Diagnosis Date  . Anemia    Iron Deficiency in the past  . Asthma    in childhood  . Bowen's disease   . Complication of anesthesia    HARD TO WAKE UP SOMETIIMES  . Dyspnea 04/2019   sob and coughing has increased of late  . Endometriosis    dx'd in high school  . Eosinophilic pneumonia ~10/7492   per Dr. Clayborn Bigness s/p VATS and 1 year of prednisone  . GERD (gastroesophageal reflux disease)    TUMS PRN  . Headache    MIGRAINES without aura  . History of abnormal Pap smear   . History of chicken pox   . Hypothyroidism    dx'd 2011  . Pneumonia 2009/ 4967   EOSINOPHILIC PNEUMONIA  . PONV (postoperative nausea  and vomiting)   . Seizures ([place])    childhood-previously on Tegretol-been off meds for years with no seizures  . Urinary tract bacterial infections     Past Surgical History:  Procedure Laterality Date  . CESAREAN SECTION  2007  . CHOLECYSTECTOMY N/A 10/27/2016   Procedure: LAPAROSCOPIC CHOLECYSTECTOMY;  Surgeon: Jules Husbands, MD;  Location: [facility] ORS;  Service: General;  Laterality: N/A;  . DIAGNOSTIC LAPAROSCOPY  2018   X2  . FLEXIBLE BRONCHOSCOPY N/A 05/03/2019   Procedure: FLEXIBLE BRONCHOSCOPY;  Surgeon: Allyne Gee, MD;  Location: [facility] ORS;  Service: Pulmonary;  Laterality: N/A;  . LUNG BIOPSY Right 2008   partial right lung resection.  was treated with steroids x 1 year  . TONSILLECTOMY AND ADENOIDECTOMY      Family History  Problem Relation Age of Onset  . Mental illness Mother        bipolar  . Cancer Father        h/o throat cancer- treated  . Heart disease Maternal Grandfather        at ate >60  . Alcohol abuse Maternal Grandfather   . Stomach cancer Neg Hx   . Rectal cancer Neg Hx   . Esophageal cancer Neg Hx   . Colon cancer Neg Hx   . Breast cancer Neg Hx       Social History   Tobacco Use  Smoking Status Former Smoker  . Packs/day: 0.50  . Years: 10.00  . Pack years: 5.00  . Types: Cigarettes  . Quit date: 10/21/2004  . Years since quitting: 15.1  Smokeless Tobacco Never Used    Social History   Substance and Sexual Activity  Alcohol Use Yes   Comment: rare     No Known Allergies  Current Outpatient Medications  Medication Sig Dispense Refill  . albuterol (PROVENTIL HFA;VENTOLIN HFA) 108 (90 Base) MCG/ACT inhaler Inhale 2 puffs into the lungs every 4 (four) hours as needed for wheezing or shortness of breath. 1 Inhaler 0  . azithromycin (ZITHROMAX) 250 MG tablet Take 1 tab po daily 30 tablet 2  . levothyroxine (SYNTHROID) 125 MCG tablet TAKE 1 TABLET BY MOUTH ONCE DAILY EXCEPT TAKE 1 AND 1/2 TABLETS ON SUNDAYS 98 tablet 1  . predniSONE (DELTASONE) 20 MG tablet TAKE 1 TABLET(20 MG) BY MOUTH DAILY WITH BREAKFAST 60 tablet 2  . predniSONE (DELTASONE) 20 MG tablet Take 20 mg by mouth daily with breakfast.     . predniSONE (DELTASONE) 5 MG tablet Take as directed 120 tablet 4  . voriconazole (VFEND) 200 MG tablet Take 1 tablet (200 mg total) by mouth 2 (two) times daily. 60 tablet 4   No current facility-administered medications for this visit.      Review of Systems:     Cardiac Review of Systems: [Y] = yes  or   [ N ] = no   Chest Pain [ n   ]  Resting SOB [ y  ] Exertional SOB  Blue.Reese  ]  Orthopnea [  ]   Pedal Edema [   ]    Palpitations [  ] Syncope  [  ]   Presyncope [   ]   General Review of Systems: [Y] = yes [  ]=no Constitional: recent weight change [  ];  Wt loss over the last 3 months [   ] anorexia [  ]; fatigue [  ]; nausea [  ]; night sweats [  ]; fever [  ];  or chills [  ];           Eye : blurred vision [  ]; diplopia [   ]; vision changes [  ];  Amaurosis fugax[  ]; Resp: cough [  ];  wheezing[  ];  hemoptysis[  ]; shortness of breath[  ]; paroxysmal nocturnal dyspnea[  ]; dyspnea on exertion[  ]; or orthopnea[   ];  GI:  gallstones[  ], vomiting[  ];  dysphagia[  ]; melena[  ];  hematochezia [  ]; heartburn[  ];   Hx of  Colonoscopy[  ]; GU: kidney stones [  ]; hematuria[  ];   dysuria [  ];  nocturia[  ];  history of     obstruction [  ]; urinary frequency [  ]             Skin: rash, swelling[  ];, hair loss[  ];  peripheral edema[  ];  or itching[  ]; Musculosketetal: myalgias[  ];  joint swelling[  ];  joint erythema[  ];  joint pain[  ];  back pain[  ];  Heme/Lymph: bruising[  ];  bleeding[  ];  anemia[  ];  Neuro: TIA[  ];  headaches[  ];  stroke[  ];  vertigo[  ];  seizures[  ];   paresthesias[  ];  difficulty walking[  ];  Psych:depression[  ]; anxiety[  ];  Endocrine: diabetes[  ];  thyroid dysfunction[  ];  Immunizations: Flu up to date Blue.Reese  ]; Pneumococcal up to date [ y ];  Other:     PHYSICAL EXAMINATION: BP 122/88   Pulse 100   Temp 97.7 F (36.5 C) (Skin)   Resp 20   Ht _0  (1.575 m)   Wt 226 lb (102.5 kg)   SpO2 97%   BMI 41.34 kg/m  General appearance: alert, cooperative, appears stated age and Patient has general facies of steroid use Head: Normocephalic, without obvious abnormality, atraumatic Neck: no adenopathy, no carotid bruit, no JVD, supple, symmetrical, trachea midline and thyroid not enlarged, symmetric, no tenderness/mass/nodules Lymph nodes: Cervical, supraclavicular, and axillary nodes normal. Resp: clear to auscultation bilaterally Back: symmetric, no curvature. ROM normal. No CVA tenderness. Cardio: regular rate and rhythm, S1, S2 normal, no murmur, click, rub or gallop GI: soft, non-tender; bowel sounds normal; no masses,  no organomegaly Extremities: extremities normal, atraumatic, no cyanosis or edema Neurologic: Grossly normal  Diagnostic Studies & Laboratory data:     Recent Radiology Findings:   DG Chest 2 View  Result Date: 11/22/2019 CLINICAL DATA:  Nonproductive cough and shortness of breath for 1 week. EXAM: CHEST - 2 VIEW COMPARISON:   05/03/2019 FINDINGS: The heart size and mediastinal contours are within normal limits. Bibasilar scarring is again seen with elevation of the right hemidiaphragm. No evidence of acute infiltrate or edema. No evidence of pleural effusion. IMPRESSION: Stable bibasilar scarring. No active disease. Electronically Signed   By: Marlaine Hind M.D.   On: 11/22/2019 20:35    CLINICAL DATA:  Interstitial lung disease  EXAM: CT CHEST WITHOUT CONTRAST  TECHNIQUE: Multidetector CT imaging of the chest was performed following the standard protocol without intravenous contrast. High resolution imaging of the lungs, as well as inspiratory and expiratory imaging, was performed.  COMPARISON:  06/28/2019, 04/21/2019, 09/27/2012  FINDINGS: Cardiovascular: No significant vascular findings. Normal heart size. No pericardial effusion.  Mediastinum/Nodes: No enlarged mediastinal, hilar, or axillary lymph nodes. Thyroid gland, trachea, and esophagus demonstrate no significant  findings.  Lungs/Pleura: No significant interval change in bibasilar predominant pattern of moderate fibrosis featuring extensive bronchiectasis and volume loss of the lower lobes, and extensive associated ground-glass opacity, most conspicuous in the right lower lobe. Redemonstrated postoperative findings of right lung wedge biopsies. No pleural effusion or pneumothorax.  Upper Abdomen: No acute abnormality.  Musculoskeletal: No chest wall mass or suspicious bone lesions identified.  IMPRESSION: No significant interval change in bibasilar predominant pattern of fibrosis featuring extensive bronchiectasis and volume loss of the lower lobes and extensive associated ground-glass opacity, most conspicuous in the right lower lobe. Redemonstrated postoperative findings of right lung wedge biopsies. As noted on prior examination, these findings have worsened over time in comparison to remote prior examination dated 2013 and  by report patient has a biopsy diagnosis of chronic eosinophilic pneumonia. No acute airspace disease at this time.   Electronically Signed   By: Eddie Candle M.D.   On: 09/27/2019 10:33   I have independently reviewed the above radiology studies  and reviewed the findings with the patient.   Recent Lab Findings: Lab Results  Component Value Date   WBC 7.3 04/28/2019   HGB 12.4 04/28/2019   HCT 37.6 04/28/2019   PLT 276.0 04/28/2019   GLUCOSE 93 10/10/2019   CHOL 148 04/28/2019   TRIG 149.0 04/28/2019   HDL 42.50 04/28/2019   LDLCALC 76 04/28/2019   ALT 22 10/10/2019   AST 20 10/10/2019   NA 134 (L) 10/10/2019   K 3.1 (L) 10/10/2019   CL 102 10/10/2019   CREATININE 0.66 10/10/2019   BUN 13 10/10/2019   CO2 25 10/10/2019   TSH 2.919 10/10/2019   INR 1.0 12/02/2007      Assessment / Plan:   Patient with previous history of eosinophilic pneumonitis treated with steroids-with significant improvement over 12 years until recently with return of symptoms-patient is referred for lung biopsy to confirm recurrence of previous problem versus different diagnosis.  I discussed with the patient the procedure for lung biopsy.  I will plan to see her back soon after her CT scan scheduled for March 12 and tentatively plan to proceed with robotic assisted lung biopsy on March 22, Monday.  After the scan will decide whether to proceed on the right, which would be a repeat biopsy versus on the left.  Previous scans as indicated more significant disease in the right lung.   Grace Isaac MD      [address] [place] [address] Office [phone]     12/20/2019 3:20 PM

## 2019-12-21 ENCOUNTER — Encounter: Payer: Self-pay | Admitting: *Deleted

## 2019-12-30 ENCOUNTER — Other Ambulatory Visit: Payer: Self-pay

## 2019-12-30 ENCOUNTER — Ambulatory Visit
Admission: RE | Admit: 2019-12-30 | Discharge: 2019-12-30 | Disposition: A | Discharge status: Home / Self Care | Payer: 59 | Source: Ambulatory Visit | Attending: Internal Medicine | Admitting: Internal Medicine

## 2019-12-30 ENCOUNTER — Other Ambulatory Visit: Payer: Self-pay | Admitting: *Deleted

## 2019-12-30 DIAGNOSIS — J8281 Chronic eosinophilic pneumonia: Secondary | ICD-10-CM | POA: Insufficient documentation

## 2019-12-30 DIAGNOSIS — J849 Interstitial pulmonary disease, unspecified: Secondary | ICD-10-CM

## 2020-01-04 NOTE — Pre-Procedure Instructions (Signed)
[facility] - [place], [place] - [address] [place] [address] Phone: [phone] Fax: [phone]  [id number], [place] - [address] AT [facility] OF [address] [facility] [place] [address] Phone: [phone] Fax: [phone]  [facility] - [place], [place] - [address] [place] [address] Phone: [phone] Fax: [phone]      Your procedure is scheduled on Monday, March 22nd.  Report to [facility] Main Entrance "A" at 5:30 A.M., and check in at the Admitting office.  Call this number if you have problems the morning of surgery:  [phone]  Call [phone] if you have any questions prior to your surgery date Monday-Friday 8am-4pm    Remember:  Do not eat or drink after midnight the night before your surgery    Take these medicines the morning of surgery with A SIP OF WATER  levothyroxine (SYNTHROID) azithromycin (ZITHROMAX) Albuterol inhaler if needed for shortness of breath-please bring with you on the morning of surgery.   As of today, STOP taking any Aspirin (unless otherwise instructed by your surgeon), Aleve, Naproxen, Ibuprofen, Motrin, Advil, Goody's, BC's, all herbal medications, fish oil, and all vitamins.    The Morning of Surgery  Do not wear jewelry, make-up or nail polish.  Do not wear lotions, powders, or perfumes, or deodorant  Do not shave 48 hours prior to surgery.    Do not bring valuables to the hospital.  [facility] is not responsible for any belongings or valuables.  If you are a smoker, DO NOT Smoke 24 hours prior to surgery  If you wear a CPAP at night please bring your mask the morning of surgery   Remember that you must have someone to transport you home after your surgery, and remain with you for 24 hours if you are discharged the same day.   Please bring cases for contacts, glasses,  hearing aids, dentures or bridgework because it cannot be worn into surgery.    Leave your suitcase in the car.  After surgery it may be brought to your room.  For patients admitted to the hospital, discharge time will be determined by your treatment team.  Patients discharged the day of surgery will not be allowed to drive home.    Special instructions:   [facility]- Preparing For Surgery  Before surgery, you can play an important role. Because skin is not sterile, your skin needs to be as free of germs as possible. You can reduce the number of germs on your skin by washing with CHG (chlorahexidine gluconate) Soap before surgery.  CHG is an antiseptic cleaner which kills germs and bonds with the skin to continue killing germs even after washing.    Oral Hygiene is also important to reduce your risk of infection.  Remember - BRUSH YOUR TEETH THE MORNING OF SURGERY WITH YOUR REGULAR TOOTHPASTE  Please do not use if you have an allergy to CHG or antibacterial soaps. If your skin becomes reddened/irritated stop using the CHG.  Do not shave (including legs and underarms) for at least 48 hours prior to first CHG shower. It is OK to shave your face.  Please follow these instructions carefully.   1. Shower the NIGHT BEFORE SURGERY and the MORNING OF SURGERY with CHG Soap.   2. If you chose to wash your hair, wash your hair first  as usual with your normal shampoo.  3. After you shampoo, rinse your hair and body thoroughly to remove the shampoo.  4. Use CHG as you would any other liquid soap. You can apply CHG directly to the skin and wash gently with a scrungie or a clean washcloth.   5. Apply the CHG Soap to your body ONLY FROM THE NECK DOWN.  Do not use on open wounds or open sores. Avoid contact with your eyes, ears, mouth and genitals (private parts). Wash Face and genitals (private parts)  with your normal soap.   6. Wash thoroughly, paying special attention to the area where your  surgery will be performed.  7. Thoroughly rinse your body with warm water from the neck down.  8. DO NOT shower/wash with your normal soap after using and rinsing off the CHG Soap.  9. Pat yourself dry with a CLEAN TOWEL.  10. Wear CLEAN PAJAMAS to bed the night before surgery, wear comfortable clothes the morning of surgery  11. Place CLEAN SHEETS on your bed the night of your first shower and DO NOT SLEEP WITH PETS.    Day of Surgery:  Please shower the morning of surgery with the CHG soap Do not apply any deodorants/lotions. Please wear clean clothes to the hospital/surgery center.   Remember to brush your teeth WITH YOUR REGULAR TOOTHPASTE.   Please read over the following fact sheets that you were given.

## 2020-01-05 ENCOUNTER — Encounter (HOSPITAL_COMMUNITY)
Admission: RE | Admit: 2020-01-05 | Discharge: 2020-01-05 | Disposition: A | Discharge status: Home / Self Care | Payer: 59 | Source: Ambulatory Visit | Attending: Cardiothoracic Surgery | Admitting: Cardiothoracic Surgery

## 2020-01-05 ENCOUNTER — Other Ambulatory Visit (HOSPITAL_COMMUNITY)
Admission: RE | Admit: 2020-01-05 | Discharge: 2020-01-05 | Disposition: A | Discharge status: Home / Self Care | Payer: 59 | Source: Ambulatory Visit | Attending: Cardiothoracic Surgery | Admitting: Cardiothoracic Surgery

## 2020-01-05 ENCOUNTER — Other Ambulatory Visit: Payer: Self-pay

## 2020-01-05 ENCOUNTER — Encounter (HOSPITAL_COMMUNITY): Payer: Self-pay

## 2020-01-05 ENCOUNTER — Ambulatory Visit (INDEPENDENT_AMBULATORY_CARE_PROVIDER_SITE_OTHER): Payer: 59 | Admitting: Cardiothoracic Surgery

## 2020-01-05 ENCOUNTER — Encounter: Payer: Self-pay | Admitting: Cardiothoracic Surgery

## 2020-01-05 VITALS — BP 121/87 | HR 80 | Temp 97.5°F | Resp 16 | Ht 62.0 in | Wt 226.0 lb

## 2020-01-05 DIAGNOSIS — J849 Interstitial pulmonary disease, unspecified: Secondary | ICD-10-CM | POA: Diagnosis not present

## 2020-01-05 DIAGNOSIS — Z20822 Contact with and (suspected) exposure to covid-19: Secondary | ICD-10-CM | POA: Diagnosis not present

## 2020-01-05 DIAGNOSIS — Z01818 Encounter for other preprocedural examination: Secondary | ICD-10-CM | POA: Insufficient documentation

## 2020-01-05 LAB — SURGICAL PCR SCREEN
MRSA, PCR: NEGATIVE
Staphylococcus aureus: NEGATIVE

## 2020-01-05 LAB — TYPE AND SCREEN
ABO/RH(D): O NEG
Antibody Screen: NEGATIVE

## 2020-01-05 LAB — BLOOD GAS, ARTERIAL
Acid-Base Excess: 1.4 mmol/L (ref 0.0–2.0)
Bicarbonate: 25 mmol/L (ref 20.0–28.0)
Drawn by: 421801
FIO2: 21
O2 Saturation: 98.5 %
Patient temperature: 37
pCO2 arterial: 36.2 mmHg (ref 32.0–48.0)
pH, Arterial: 7.453 — ABNORMAL HIGH (ref 7.350–7.450)
pO2, Arterial: 109 mmHg — ABNORMAL HIGH (ref 83.0–108.0)

## 2020-01-05 LAB — URINALYSIS, ROUTINE W REFLEX MICROSCOPIC
Bilirubin Urine: NEGATIVE
Glucose, UA: NEGATIVE mg/dL
Hgb urine dipstick: NEGATIVE
Ketones, ur: NEGATIVE mg/dL
Nitrite: NEGATIVE
Protein, ur: NEGATIVE mg/dL
Specific Gravity, Urine: 1.024 (ref 1.005–1.030)
pH: 5 (ref 5.0–8.0)

## 2020-01-05 LAB — CBC
HCT: 37.8 % (ref 36.0–46.0)
Hemoglobin: 11.5 g/dL — ABNORMAL LOW (ref 12.0–15.0)
MCH: 25.7 pg — ABNORMAL LOW (ref 26.0–34.0)
MCHC: 30.4 g/dL (ref 30.0–36.0)
MCV: 84.4 fL (ref 80.0–100.0)
Platelets: 298 10*3/uL (ref 150–400)
RBC: 4.48 MIL/uL (ref 3.87–5.11)
RDW: 14.9 % (ref 11.5–15.5)
WBC: 11.9 10*3/uL — ABNORMAL HIGH (ref 4.0–10.5)
nRBC: 0 % (ref 0.0–0.2)

## 2020-01-05 LAB — COMPREHENSIVE METABOLIC PANEL
ALT: 18 U/L (ref 0–44)
AST: 25 U/L (ref 15–41)
Albumin: 3.4 g/dL — ABNORMAL LOW (ref 3.5–5.0)
Alkaline Phosphatase: 43 U/L (ref 38–126)
Anion gap: 11 (ref 5–15)
BUN: 8 mg/dL (ref 6–20)
CO2: 21 mmol/L — ABNORMAL LOW (ref 22–32)
Calcium: 8.8 mg/dL — ABNORMAL LOW (ref 8.9–10.3)
Chloride: 108 mmol/L (ref 98–111)
Creatinine, Ser: 0.71 mg/dL (ref 0.44–1.00)
GFR calc Af Amer: >60 mL/min (ref >60)
GFR calc non Af Amer: >60 mL/min (ref >60)
Glucose, Bld: 99 mg/dL (ref 70–99)
Potassium: 3.8 mmol/L (ref 3.5–5.1)
Sodium: 140 mmol/L (ref 135–145)
Total Bilirubin: 0.9 mg/dL (ref 0.3–1.2)
Total Protein: 6.2 g/dL — ABNORMAL LOW (ref 6.5–8.1)

## 2020-01-05 LAB — APTT: aPTT: 30 seconds (ref 24–36)

## 2020-01-05 LAB — PROTIME-INR
INR: 1 (ref 0.8–1.2)
Prothrombin Time: 13.1 seconds (ref 11.4–15.2)

## 2020-01-05 LAB — SARS CORONAVIRUS 2 (TAT 6-24 HRS): SARS Coronavirus 2: NEGATIVE

## 2020-01-05 NOTE — Progress Notes (Signed)
[name]       [place] [address]             [place] Record #[id number] Date of Birth: 1981-08-22  Referring: [name], MD Primary Care: [name], MD Primary Cardiologist: No primary care provider on file.  Chief Complaint:    Chief Complaint  Patient presents with  . Interstitial Lung Disease    Further discuss surgery scheduled for 01/09/20    History of Present Illness:    [name] 39 y.o. female is seen in the office at the request of Dr. Humphrey Rolls consider repeat lung biopsy.  In February 2009 the patient presented with severe shortness of breath and a CT scan consistent with interstitial pneumonitis.  At that time Dr. Arlyce Dice did a right video-assisted thoracoscopy with biopsy of the right upper middle and lower lobes.  Eosinophilic pneumonitis was diagnosed, the patient was treated with steroids at that time and significantly improved.  Her symptoms subsided until March 2020 when she developed flulike and pneumonia symptoms.  She was started on high-dose steroids which have been tapered down to 15 mg prednisone a day now.  Overall the patient notes she has felt better, but CT scans 2 months ago to in some months ago have not shown much improvement in interstitial disease.   Patient returns to the office today to review follow-up high density CT scan done several days ago in preparation for repeat biopsy.    Current Activity/ Functional Status:  Patient is independent with mobility/ambulation, transfers, ADL's, IADL's.   Zubrod Score: At the time of surgery this patient's most appropriate activity status/level should be described as: _0     0    Normal activity, no symptoms _1     1    Restricted in physical strenuous activity but ambulatory, able to do out light work _2     2    Ambulatory and capable of self care, unable to do work activities, up and about                >50 % of waking hours                              _3     3    Only limited self care, in bed greater than 50% of waking hours _4     4    Completely disabled, no self care, confined to bed or chair _5     5    Moribund   Past Medical History:  Diagnosis Date  . Anemia    Iron Deficiency in the past  . Asthma    in childhood  . Bowen's disease   . Complication of anesthesia    HARD TO WAKE UP SOMETIIMES  . Dyspnea 04/2019   sob and coughing has increased of late  . Endometriosis    dx'd in high school  . Eosinophilic pneumonia ~[id number]/[id number]   per Dr. Clayborn Bigness s/p VATS and 1 year of prednisone  . GERD (gastroesophageal reflux disease)    TUMS PRN  . Headache    MIGRAINES without aura  . History of abnormal Pap smear   . History of chicken pox   . Hypothyroidism    dx'd 2011  . Pneumonia  2009/ 1610   EOSINOPHILIC PNEUMONIA  . PONV (postoperative nausea and vomiting)   . Seizures ([place])    childhood-previously on Tegretol-been off meds for years with no seizures  . Urinary tract bacterial infections     Past Surgical History:  Procedure Laterality Date  . CESAREAN SECTION  2007  . CHOLECYSTECTOMY N/A 10/27/2016   Procedure: LAPAROSCOPIC CHOLECYSTECTOMY;  Surgeon: Jules Husbands, MD;  Location: [facility] ORS;  Service: General;  Laterality: N/A;  . DIAGNOSTIC LAPAROSCOPY  2018   X2  . FLEXIBLE BRONCHOSCOPY N/A 05/03/2019   Procedure: FLEXIBLE BRONCHOSCOPY;  Surgeon: Allyne Gee, MD;  Location: [facility] ORS;  Service: Pulmonary;  Laterality: N/A;  . LUNG BIOPSY Right 2008   partial right lung resection.  was treated with steroids x 1 year  . TONSILLECTOMY AND ADENOIDECTOMY      Family History  Problem Relation Age of Onset  . Mental illness Mother        bipolar  . Cancer Father        h/o throat cancer- treated  . Heart disease Maternal Grandfather        at ate >60  . Alcohol abuse Maternal Grandfather   . Stomach cancer Neg Hx   . Rectal cancer Neg Hx   . Esophageal  cancer Neg Hx   . Colon cancer Neg Hx   . Breast cancer Neg Hx      Social History   Tobacco Use  Smoking Status Former Smoker  . Packs/day: 0.50  . Years: 10.00  . Pack years: 5.00  . Types: Cigarettes  . Quit date: 10/21/2004  . Years since quitting: 15.2  Smokeless Tobacco Never Used    Social History   Substance and Sexual Activity  Alcohol Use Yes   Comment: rare     No Known Allergies  Current Outpatient Medications  Medication Sig Dispense Refill  . albuterol (PROVENTIL HFA;VENTOLIN HFA) 108 (90 Base) MCG/ACT inhaler Inhale 2 puffs into the lungs every 4 (four) hours as needed for wheezing or shortness of breath. 1 Inhaler 0  . azithromycin (ZITHROMAX) 250 MG tablet Take 1 tab po daily (Patient taking differently: Take 250 mg by mouth daily. ) 30 tablet 2  . levothyroxine (SYNTHROID) 125 MCG tablet TAKE 1 TABLET BY MOUTH ONCE DAILY EXCEPT TAKE 1 AND 1/2 TABLETS ON SUNDAYS (Patient taking differently: Take 125-187.5 mcg by mouth See admin instructions. Take 125 mcg daily except take 187.5 mcg on Sundays) 98 tablet 1  . predniSONE (DELTASONE) 5 MG tablet Take as directed (Patient taking differently: Take 15 mg by mouth every evening. ) 120 tablet 4  . predniSONE (DELTASONE) 20 MG tablet TAKE 1 TABLET(20 MG) BY MOUTH DAILY WITH BREAKFAST (Patient not taking: Reported on 12/28/2019) 60 tablet 2  . voriconazole (VFEND) 200 MG tablet Take 1 tablet (200 mg total) by mouth 2 (two) times daily. (Patient not taking: Reported on 12/28/2019) 60 tablet 4   No current facility-administered medications for this visit.      Review of Systems:     Cardiac Review of Systems: [Y] = yes  or   [ N ] = no   Chest Pain [ N   ]  Resting SOB [ Y ] Exertional SOB  [Y  ]  Orthopnea [ N ]   Pedal Edema [ N  ]    Palpitations Aqua.Slicker  ] Syncope  [ N ]   Presyncope Aqua.Slicker   ]   General Review of Systems: [  Y] = yes [  ]=no Constitional: recent weight change [  ];  Wt loss over the last 3 months [   ]  anorexia [  ]; fatigue [  ]; nausea [  ]; night sweats [  ]; fever [  ]; or chills [  ];           Eye : blurred vision [  ]; diplopia [   ]; vision changes [  ];  Amaurosis fugax[  ]; Resp: cough [  ];  wheezing[  ];  hemoptysis[  ]; shortness of breath[  ]; paroxysmal nocturnal dyspnea[  ]; dyspnea on exertion[  ]; or orthopnea[  ];  GI:  gallstones[  ], vomiting[  ];  dysphagia[  ]; melena[  ];  hematochezia [  ]; heartburn[  ];   Hx of  Colonoscopy[  ]; GU: kidney stones [  ]; hematuria[  ];   dysuria [  ];  nocturia[  ];  history of     obstruction [  ]; urinary frequency [  ]             Skin: rash, swelling[  ];, hair loss[  ];  peripheral edema[  ];  or itching[  ]; Musculosketetal: myalgias[  ];  joint swelling[  ];  joint erythema[  ];  joint pain[  ];  back pain[  ];  Heme/Lymph: bruising[  ];  bleeding[  ];  anemia[  ];  Neuro: TIA[  ];  headaches[  ];  stroke[  ];  vertigo[  ];  seizures[  ];   paresthesias[  ];  difficulty walking[  ];  Psych:depression[  ]; anxiety[  ];  Endocrine: diabetes[  ];  thyroid dysfunction[  ];  Immunizations: Flu up to date Blue.Reese  ]; Pneumococcal up to date [ y ];  Other:     PHYSICAL EXAMINATION: BP 121/87 (BP Location: Left Arm, Patient Position: Sitting, Cuff Size: Large)   Pulse 80   Temp (!) 97.5 F (36.4 C)   Resp 16   Ht _0  (1.575 m)   Wt 102.5 kg   SpO2 100% Comment: RA  BMI 41.34 kg/m  General appearance: alert, cooperative and no distress Head: Normocephalic, without obvious abnormality, atraumatic Lymph nodes: Cervical, supraclavicular, and axillary nodes normal. Resp: diminished breath sounds bibasilar Cardio: regular rate and rhythm, S1, S2 normal, no murmur, click, rub or gallop GI: soft, non-tender; bowel sounds normal; no masses,  no organomegaly Extremities: extremities normal, atraumatic, no cyanosis or edema and Homans sign is negative, no sign of DVT Neurologic: Grossly normal  Diagnostic Studies & Laboratory data:      Recent Radiology Findings:   CT Chest High Resolution  Result Date: 12/30/2019 CLINICAL DATA:  Reported history of chronic eosinophilic pneumonia with partial right lung resection in 2008, on prednisone therapy. Follow-up interstitial lung disease. Dyspnea on exertion, chronic. Remote smoking history. Reported influenza pneumonia in March 2020. EXAM: CT CHEST WITHOUT CONTRAST TECHNIQUE: Multidetector CT imaging of the chest was performed following the standard protocol without intravenous contrast. High resolution imaging of the lungs, as well as inspiratory and expiratory imaging, was performed. COMPARISON:  09/27/2019 high-resolution chest CT. 11/22/2019 chest radiograph. FINDINGS: Cardiovascular: Normal heart size. No significant pericardial effusion/thickening. Great vessels are normal in course and caliber. Mediastinum/Nodes: No discrete thyroid nodules. Unremarkable esophagus. No pathologically enlarged axillary, mediastinal or hilar lymph nodes, noting limited sensitivity for the detection of hilar adenopathy on this noncontrast study. Lungs/Pleura: No pneumothorax. No  pleural effusion. Wedge resection suture lines again noted at the peripheral right lung base. No acute consolidative airspace disease, lung masses or significant pulmonary nodules. No significant air trapping or evidence of tracheobronchomalacia on the expiration sequence. There is patchy peribronchovascular and peripheral ground-glass opacity and reticulation throughout both lungs with a strong basilar predominance with associated moderate traction bronchiectasis, volume loss and architectural distortion. No frank honeycombing. Findings have not appreciably changed in the short interval since 09/27/2019 CT. Findings have clearly progressed compared to more remote chest CT study of 09/27/2012. Upper abdomen: Cholecystectomy. Musculoskeletal:  No aggressive appearing focal osseous lesions. IMPRESSION: Stable chest CT in the short interval  since 09/27/2019. Spectrum of findings compatible with basilar predominant fibrotic interstitial lung disease with moderate traction bronchiectasis and no honeycombing. Findings have clearly progressed compared to more remote chest CT study of 09/27/2012. Findings are categorized as probable UIP per consensus guidelines: Diagnosis of Idiopathic Pulmonary Fibrosis: An Official ATS/ERS/JRS/ALAT Clinical Practice Guideline. [place], Iss 5, [id number], Jun 20 2017. Electronically Signed   By: Ilona Sorrel M.D.   On: 12/30/2019 14:37   I have independently reviewed the above radiology studies  and reviewed the findings with the patient.      CLINICAL DATA:  Interstitial lung disease  EXAM: CT CHEST WITHOUT CONTRAST  TECHNIQUE: Multidetector CT imaging of the chest was performed following the standard protocol without intravenous contrast. High resolution imaging of the lungs, as well as inspiratory and expiratory imaging, was performed.  COMPARISON:  06/28/2019, 04/21/2019, 09/27/2012  FINDINGS: Cardiovascular: No significant vascular findings. Normal heart size. No pericardial effusion.  Mediastinum/Nodes: No enlarged mediastinal, hilar, or axillary lymph nodes. Thyroid gland, trachea, and esophagus demonstrate no significant findings.  Lungs/Pleura: No significant interval change in bibasilar predominant pattern of moderate fibrosis featuring extensive bronchiectasis and volume loss of the lower lobes, and extensive associated ground-glass opacity, most conspicuous in the right lower lobe. Redemonstrated postoperative findings of right lung wedge biopsies. No pleural effusion or pneumothorax.  Upper Abdomen: No acute abnormality.  Musculoskeletal: No chest wall mass or suspicious bone lesions identified.  IMPRESSION: No significant interval change in bibasilar predominant pattern of fibrosis featuring extensive bronchiectasis and volume loss of  the lower lobes and extensive associated ground-glass opacity, most conspicuous in the right lower lobe. Redemonstrated postoperative findings of right lung wedge biopsies. As noted on prior examination, these findings have worsened over time in comparison to remote prior examination dated 2013 and by report patient has a biopsy diagnosis of chronic eosinophilic pneumonia. No acute airspace disease at this time.   Electronically Signed   By: Eddie Candle M.D.   On: 09/27/2019 10:33   I have independently reviewed the above radiology studies  and reviewed the findings with the patient.   Recent Lab Findings: Lab Results  Component Value Date   WBC 7.3 04/28/2019   HGB 12.4 04/28/2019   HCT 37.6 04/28/2019   PLT 276.0 04/28/2019   GLUCOSE 93 10/10/2019   CHOL 148 04/28/2019   TRIG 149.0 04/28/2019   HDL 42.50 04/28/2019   LDLCALC 76 04/28/2019   ALT 22 10/10/2019   AST 20 10/10/2019   NA 134 (L) 10/10/2019   K 3.1 (L) 10/10/2019   CL 102 10/10/2019   CREATININE 0.66 10/10/2019   BUN 13 10/10/2019   CO2 25 10/10/2019   TSH 2.919 10/10/2019   INR 1.0 12/02/2007      Assessment / Plan:   Patient with previous  history of eosinophilic pneumonitis treated with steroids-with significant improvement over 12 years until recently with return of symptoms-patient is referred for lung biopsy to confirm recurrence of previous problem versus different diagnosis.  I have reviewed the most current CT scan with the patient and the plan for repeat.  Risks and options are reviewed with the patient in detail.  Although she has had surgery on the right before the right lung years most significantly affected and will more likely be more representative of underlying disease process.  I recommended to her that we proceed with robotic assisted right lung biopsy next week.   I  spent 25 minutes with  the patient face to face and in review of xrays and records.    Grace Isaac MD        [address] [place] [address] Office [phone]     01/05/2020 11:29 AM

## 2020-01-05 NOTE — Progress Notes (Signed)
PCP - graham duncan @ [facility] [place] Cardiologist - na Pul: khan    Chest x-ray - DOS EKG - 01/05/20 Stress Test - na ECHO - na Cardiac Cath - na  Sleep Study - na CPAP -   Fasting Blood Sugar - na Checks Blood Sugar _____ times a day  Blood Thinner Instructions:na Aspirin Instructions:    COVID TEST- 01/05/20   Anesthesia review:   Patient denies shortness of breath, fever, cough and chest pain at PAT appointment   All instructions explained to the patient, with a verbal understanding of the material. Patient agrees to go over the instructions while at home for a better understanding. Patient also instructed to self quarantine after being tested for COVID-19. The opportunity to ask questions was provided.

## 2020-01-09 ENCOUNTER — Inpatient Hospital Stay (HOSPITAL_COMMUNITY): Payer: 59 | Admitting: Certified Registered Nurse Anesthetist

## 2020-01-09 ENCOUNTER — Other Ambulatory Visit: Payer: Self-pay

## 2020-01-09 ENCOUNTER — Inpatient Hospital Stay (HOSPITAL_COMMUNITY): Payer: 59

## 2020-01-09 ENCOUNTER — Encounter (HOSPITAL_COMMUNITY)
Admission: RE | Disposition: A | Discharge status: Home / Self Care | Payer: Self-pay | Source: Home / Self Care | Attending: Cardiothoracic Surgery

## 2020-01-09 ENCOUNTER — Inpatient Hospital Stay (HOSPITAL_COMMUNITY)
Admission: RE | Admit: 2020-01-09 | Discharge: 2020-01-11 | DRG: 168 | Disposition: A | Discharge status: Home / Self Care | Payer: 59 | Attending: Cardiothoracic Surgery | Admitting: Cardiothoracic Surgery

## 2020-01-09 ENCOUNTER — Encounter (HOSPITAL_COMMUNITY): Payer: Self-pay | Admitting: Cardiothoracic Surgery

## 2020-01-09 DIAGNOSIS — G43909 Migraine, unspecified, not intractable, without status migrainosus: Secondary | ICD-10-CM | POA: Diagnosis present

## 2020-01-09 DIAGNOSIS — J939 Pneumothorax, unspecified: Secondary | ICD-10-CM

## 2020-01-09 DIAGNOSIS — Z8619 Personal history of other infectious and parasitic diseases: Secondary | ICD-10-CM | POA: Diagnosis not present

## 2020-01-09 DIAGNOSIS — Z86007 Personal history of in-situ neoplasm of skin: Secondary | ICD-10-CM | POA: Diagnosis not present

## 2020-01-09 DIAGNOSIS — J849 Interstitial pulmonary disease, unspecified: Secondary | ICD-10-CM

## 2020-01-09 DIAGNOSIS — Z9689 Presence of other specified functional implants: Secondary | ICD-10-CM

## 2020-01-09 DIAGNOSIS — E039 Hypothyroidism, unspecified: Secondary | ICD-10-CM | POA: Diagnosis present

## 2020-01-09 DIAGNOSIS — J8489 Other specified interstitial pulmonary diseases: Secondary | ICD-10-CM | POA: Diagnosis present

## 2020-01-09 DIAGNOSIS — Z09 Encounter for follow-up examination after completed treatment for conditions other than malignant neoplasm: Secondary | ICD-10-CM

## 2020-01-09 DIAGNOSIS — K219 Gastro-esophageal reflux disease without esophagitis: Secondary | ICD-10-CM | POA: Diagnosis present

## 2020-01-09 DIAGNOSIS — Z8701 Personal history of pneumonia (recurrent): Secondary | ICD-10-CM | POA: Diagnosis not present

## 2020-01-09 DIAGNOSIS — R0602 Shortness of breath: Secondary | ICD-10-CM | POA: Diagnosis present

## 2020-01-09 DIAGNOSIS — Z01818 Encounter for other preprocedural examination: Secondary | ICD-10-CM

## 2020-01-09 DIAGNOSIS — Z87891 Personal history of nicotine dependence: Secondary | ICD-10-CM

## 2020-01-09 DIAGNOSIS — Z4682 Encounter for fitting and adjustment of non-vascular catheter: Secondary | ICD-10-CM

## 2020-01-09 HISTORY — PX: VIDEO BRONCHOSCOPY: SHX5072

## 2020-01-09 LAB — POCT PREGNANCY, URINE: Preg Test, Ur: NEGATIVE

## 2020-01-09 SURGERY — BRONCHOSCOPY, VIDEO-ASSISTED
Anesthesia: General | Site: Chest | Laterality: Right

## 2020-01-09 MED ORDER — 0.9 % SODIUM CHLORIDE (POUR BTL) OPTIME
TOPICAL | Status: DC | PRN
  Administered 2020-01-09: 09:00:00 3000 mL

## 2020-01-09 MED ORDER — BISACODYL 5 MG PO TBEC: 10.0000 mg | DELAYED_RELEASE_TABLET | Freq: Every day | ORAL | Status: DC

## 2020-01-09 MED ORDER — PROPOFOL 10 MG/ML IV BOLUS
INTRAVENOUS | Status: AC
  Filled 2020-01-09: qty 40

## 2020-01-09 MED ORDER — ONDANSETRON HCL 4 MG/2ML IJ SOLN
INTRAMUSCULAR | Status: DC | PRN
  Administered 2020-01-09: 4 mg via INTRAVENOUS

## 2020-01-09 MED ORDER — SENNOSIDES-DOCUSATE SODIUM 8.6-50 MG PO TABS
1.0000 | ORAL_TABLET | Freq: Every day | ORAL | Status: DC
  Administered 2020-01-10: 1 via ORAL
  Filled 2020-01-09: qty 1

## 2020-01-09 MED ORDER — PROMETHAZINE HCL 25 MG/ML IJ SOLN: 6.2500 mg | INTRAMUSCULAR | Status: DC | PRN

## 2020-01-09 MED ORDER — CEFAZOLIN SODIUM-DEXTROSE 2-4 GM/100ML-% IV SOLN
INTRAVENOUS | Status: AC
  Filled 2020-01-09: qty 100

## 2020-01-09 MED ORDER — ACETAMINOPHEN 160 MG/5ML PO SOLN: 1000.0000 mg | Freq: Four times a day (QID) | ORAL | Status: DC

## 2020-01-09 MED ORDER — BUPIVACAINE HCL (PF) 0.5 % IJ SOLN
INTRAMUSCULAR | Status: AC
  Filled 2020-01-09: qty 30

## 2020-01-09 MED ORDER — LEVOTHYROXINE SODIUM 75 MCG PO TABS: 187.5000 ug | ORAL_TABLET | ORAL | Status: DC

## 2020-01-09 MED ORDER — FENTANYL CITRATE (PF) 100 MCG/2ML IJ SOLN
25.0000 ug | INTRAMUSCULAR | Status: DC | PRN
  Administered 2020-01-09 – 2020-01-10 (×4): 50 ug via INTRAVENOUS
  Filled 2020-01-09 (×4): qty 2

## 2020-01-09 MED ORDER — SODIUM CHLORIDE 0.9 % IV SOLN: INTRAVENOUS | Status: DC | PRN

## 2020-01-09 MED ORDER — SCOPOLAMINE 1 MG/3DAYS TD PT72
MEDICATED_PATCH | TRANSDERMAL | Status: DC | PRN
  Administered 2020-01-09: 1 via TRANSDERMAL

## 2020-01-09 MED ORDER — PREDNISONE 10 MG PO TABS
15.0000 mg | ORAL_TABLET | Freq: Every evening | ORAL | Status: DC
  Administered 2020-01-10: 15 mg via ORAL
  Filled 2020-01-09 (×2): qty 2

## 2020-01-09 MED ORDER — LACTATED RINGERS IV SOLN: INTRAVENOUS | Status: DC | PRN

## 2020-01-09 MED ORDER — SODIUM CHLORIDE 0.9 % IR SOLN
Status: DC | PRN
  Administered 2020-01-09: 1000 mL

## 2020-01-09 MED ORDER — LEVALBUTEROL HCL 0.63 MG/3ML IN NEBU: 0.6300 mg | INHALATION_SOLUTION | Freq: Three times a day (TID) | RESPIRATORY_TRACT | Status: DC | PRN

## 2020-01-09 MED ORDER — PROPOFOL 10 MG/ML IV BOLUS
INTRAVENOUS | Status: DC | PRN
  Administered 2020-01-09: 200 mg via INTRAVENOUS

## 2020-01-09 MED ORDER — ONDANSETRON HCL 4 MG/2ML IJ SOLN
INTRAMUSCULAR | Status: AC
  Filled 2020-01-09: qty 2

## 2020-01-09 MED ORDER — LEVOTHYROXINE SODIUM 125 MCG PO TABS
125.0000 ug | ORAL_TABLET | ORAL | Status: DC
  Administered 2020-01-10 – 2020-01-11 (×2): 125 ug via ORAL
  Filled 2020-01-09 (×2): qty 1

## 2020-01-09 MED ORDER — CEFAZOLIN SODIUM-DEXTROSE 2-4 GM/100ML-% IV SOLN
2.0000 g | Freq: Three times a day (TID) | INTRAVENOUS | Status: AC
  Administered 2020-01-10: 2 g via INTRAVENOUS
  Filled 2020-01-09 (×2): qty 100

## 2020-01-09 MED ORDER — ROCURONIUM BROMIDE 50 MG/5ML IV SOSY
PREFILLED_SYRINGE | INTRAVENOUS | Status: DC | PRN
  Administered 2020-01-09 (×2): 50 mg via INTRAVENOUS

## 2020-01-09 MED ORDER — ENOXAPARIN SODIUM 40 MG/0.4ML ~~LOC~~ SOLN
40.0000 mg | SUBCUTANEOUS | Status: DC
  Administered 2020-01-10: 14:00:00 40 mg via SUBCUTANEOUS
  Filled 2020-01-09: qty 0.4

## 2020-01-09 MED ORDER — ONDANSETRON HCL 4 MG/2ML IJ SOLN
4.0000 mg | Freq: Four times a day (QID) | INTRAMUSCULAR | Status: DC | PRN
  Administered 2020-01-09: 4 mg via INTRAVENOUS

## 2020-01-09 MED ORDER — PHENYLEPHRINE HCL-NACL 10-0.9 MG/250ML-% IV SOLN
INTRAVENOUS | Status: DC | PRN
  Administered 2020-01-09: 25 ug/min via INTRAVENOUS

## 2020-01-09 MED ORDER — FENTANYL CITRATE (PF) 250 MCG/5ML IJ SOLN
INTRAMUSCULAR | Status: AC
  Filled 2020-01-09: qty 5

## 2020-01-09 MED ORDER — MIDAZOLAM HCL 2 MG/2ML IJ SOLN
INTRAMUSCULAR | Status: AC
  Filled 2020-01-09: qty 2

## 2020-01-09 MED ORDER — FENTANYL CITRATE (PF) 100 MCG/2ML IJ SOLN
INTRAMUSCULAR | Status: AC
  Administered 2020-01-09: 50 ug via INTRAVENOUS
  Filled 2020-01-09: qty 2

## 2020-01-09 MED ORDER — ALBUTEROL SULFATE HFA 108 (90 BASE) MCG/ACT IN AERS
INHALATION_SPRAY | RESPIRATORY_TRACT | Status: DC | PRN
  Administered 2020-01-09 (×2): 6 via RESPIRATORY_TRACT

## 2020-01-09 MED ORDER — DEXAMETHASONE SODIUM PHOSPHATE 10 MG/ML IJ SOLN
INTRAMUSCULAR | Status: DC | PRN
  Administered 2020-01-09: 10 mg via INTRAVENOUS

## 2020-01-09 MED ORDER — CEFAZOLIN SODIUM-DEXTROSE 2-4 GM/100ML-% IV SOLN
2.0000 g | INTRAVENOUS | Status: AC
  Administered 2020-01-09: 2 g via INTRAVENOUS

## 2020-01-09 MED ORDER — POTASSIUM CHLORIDE IN NACL 20-0.9 MEQ/L-% IV SOLN
INTRAVENOUS | Status: DC
  Filled 2020-01-09 (×4): qty 1000

## 2020-01-09 MED ORDER — HYDROMORPHONE HCL 1 MG/ML IJ SOLN
INTRAMUSCULAR | Status: AC
  Filled 2020-01-09: qty 1

## 2020-01-09 MED ORDER — TRAMADOL HCL 50 MG PO TABS: 50.0000 mg | ORAL_TABLET | Freq: Four times a day (QID) | ORAL | Status: DC | PRN

## 2020-01-09 MED ORDER — SUGAMMADEX SODIUM 200 MG/2ML IV SOLN
INTRAVENOUS | Status: DC | PRN
  Administered 2020-01-09: 200 mg via INTRAVENOUS

## 2020-01-09 MED ORDER — ACETAMINOPHEN 500 MG PO TABS
1000.0000 mg | ORAL_TABLET | Freq: Four times a day (QID) | ORAL | Status: DC
  Administered 2020-01-10 – 2020-01-11 (×3): 1000 mg via ORAL
  Filled 2020-01-09 (×3): qty 2

## 2020-01-09 MED ORDER — OXYCODONE HCL 5 MG PO TABS
5.0000 mg | ORAL_TABLET | ORAL | Status: DC | PRN
  Administered 2020-01-10 – 2020-01-11 (×4): 5 mg via ORAL
  Filled 2020-01-09 (×4): qty 1

## 2020-01-09 MED ORDER — FENTANYL CITRATE (PF) 100 MCG/2ML IJ SOLN
INTRAMUSCULAR | Status: DC | PRN
  Administered 2020-01-09: 50 ug via INTRAVENOUS
  Administered 2020-01-09: 150 ug via INTRAVENOUS
  Administered 2020-01-09 (×3): 50 ug via INTRAVENOUS
  Administered 2020-01-09: 100 ug via INTRAVENOUS

## 2020-01-09 MED ORDER — SCOPOLAMINE 1 MG/3DAYS TD PT72
MEDICATED_PATCH | TRANSDERMAL | Status: AC
  Filled 2020-01-09: qty 1

## 2020-01-09 MED ORDER — CHLORHEXIDINE GLUCONATE CLOTH 2 % EX PADS
6.0000 | MEDICATED_PAD | Freq: Every day | CUTANEOUS | Status: DC
  Administered 2020-01-09 – 2020-01-10 (×2): 6 via TOPICAL

## 2020-01-09 MED ORDER — MIDAZOLAM HCL 5 MG/5ML IJ SOLN
INTRAMUSCULAR | Status: DC | PRN
  Administered 2020-01-09: 2 mg via INTRAVENOUS

## 2020-01-09 MED ORDER — ROCURONIUM BROMIDE 10 MG/ML (PF) SYRINGE
PREFILLED_SYRINGE | INTRAVENOUS | Status: AC
  Filled 2020-01-09: qty 10

## 2020-01-09 MED ORDER — BUPIVACAINE LIPOSOME 1.3 % IJ SUSP
20.0000 mL | INTRAMUSCULAR | Status: DC
  Filled 2020-01-09: qty 20

## 2020-01-09 MED ORDER — SODIUM CHLORIDE FLUSH 0.9 % IV SOLN
INTRAVENOUS | Status: DC | PRN
  Administered 2020-01-09: 100 mL

## 2020-01-09 MED ORDER — HYDROMORPHONE HCL 1 MG/ML IJ SOLN
0.2500 mg | INTRAMUSCULAR | Status: DC | PRN
  Administered 2020-01-09: 0.25 mg via INTRAVENOUS
  Administered 2020-01-09: 0.5 mg via INTRAVENOUS
  Administered 2020-01-09: 0.25 mg via INTRAVENOUS
  Administered 2020-01-09: 0.5 mg via INTRAVENOUS

## 2020-01-09 MED ORDER — DEXAMETHASONE SODIUM PHOSPHATE 10 MG/ML IJ SOLN
INTRAMUSCULAR | Status: AC
  Filled 2020-01-09: qty 1

## 2020-01-09 SURGICAL SUPPLY — 122 items
ADAPTER VALVE BIOPSY EBUS (MISCELLANEOUS) IMPLANT
ADPTR VALVE BIOPSY EBUS (MISCELLANEOUS)
ANCHOR CATH FOLEY SECURE (MISCELLANEOUS) ×3 IMPLANT
APPLICATOR TIP EXT COSEAL (VASCULAR PRODUCTS) IMPLANT
BLADE CLIPPER SURG (BLADE) ×3 IMPLANT
BNDG COHESIVE 6X5 TAN STRL LF (GAUZE/BANDAGES/DRESSINGS) ×3 IMPLANT
BRUSH CYTOL CELLEBRITY 1.5X140 (MISCELLANEOUS) IMPLANT
CANISTER SUCT 3000ML PPV (MISCELLANEOUS) ×6 IMPLANT
CANNULA REDUC XI 12-8 STAPL (CANNULA) ×1
CANNULA REDUCER 12-8 DVNC XI (CANNULA) ×2 IMPLANT
CATH THORACIC 28FR (CATHETERS) IMPLANT
CATH THORACIC 36FR (CATHETERS) IMPLANT
CATH THORACIC 36FR RT ANG (CATHETERS) IMPLANT
CLIP VESOCCLUDE MED 6/CT (CLIP) IMPLANT
CNTNR URN SCR LID CUP LEK RST (MISCELLANEOUS) ×14 IMPLANT
CONN ST 1/4X3/8  BEN (MISCELLANEOUS) ×1
CONN ST 1/4X3/8 BEN (MISCELLANEOUS) ×2 IMPLANT
CONN Y 3/8X3/8X3/8  BEN (MISCELLANEOUS)
CONN Y 3/8X3/8X3/8 BEN (MISCELLANEOUS) IMPLANT
CONT SPEC 4OZ STRL OR WHT (MISCELLANEOUS) ×7
COVER BACK TABLE 60X90IN (DRAPES) ×3 IMPLANT
COVER SURGICAL LIGHT HANDLE (MISCELLANEOUS) ×3 IMPLANT
DEFOGGER SCOPE WARMER CLEARIFY (MISCELLANEOUS) ×3 IMPLANT
DERMABOND ADVANCED (GAUZE/BANDAGES/DRESSINGS) ×1
DERMABOND ADVANCED .7 DNX12 (GAUZE/BANDAGES/DRESSINGS) ×2 IMPLANT
DISSECTOR BLUNT TIP ENDO 5MM (MISCELLANEOUS) IMPLANT
DRAIN CHANNEL 28F RND 3/8 FF (WOUND CARE) ×3 IMPLANT
DRAPE ARM DVNC X/XI (DISPOSABLE) ×10 IMPLANT
DRAPE COLUMN DVNC XI (DISPOSABLE) ×2 IMPLANT
DRAPE CV SPLIT W-CLR ANES SCRN (DRAPES) ×3 IMPLANT
DRAPE DA VINCI XI ARM (DISPOSABLE) ×5
DRAPE DA VINCI XI COLUMN (DISPOSABLE) ×1
DRAPE ORTHO SPLIT 77X108 STRL (DRAPES) ×1
DRAPE SURG ORHT 6 SPLT 77X108 (DRAPES) ×2 IMPLANT
DRAPE WARM FLUID 44X44 (DRAPES) IMPLANT
ELECT BLADE 4.0 EZ CLEAN MEGAD (MISCELLANEOUS) ×3
ELECT BLADE 6.5 EXT (BLADE) ×3 IMPLANT
ELECT REM PT RETURN 9FT ADLT (ELECTROSURGICAL) ×3
ELECTRODE BLDE 4.0 EZ CLN MEGD (MISCELLANEOUS) ×2 IMPLANT
ELECTRODE REM PT RTRN 9FT ADLT (ELECTROSURGICAL) ×2 IMPLANT
FILTER SMOKE EVAC ULPA (FILTER) ×3 IMPLANT
FORCEPS BIOP RJ4 1.8 (CUTTING FORCEPS) IMPLANT
GAUZE KITTNER 4X8 (MISCELLANEOUS) ×3 IMPLANT
GAUZE SPONGE 4X4 12PLY STRL (GAUZE/BANDAGES/DRESSINGS) ×3 IMPLANT
GLOVE BIO SURGEON STRL SZ 6.5 (GLOVE) ×6 IMPLANT
GOWN STRL REUS W/ TWL LRG LVL3 (GOWN DISPOSABLE) ×6 IMPLANT
GOWN STRL REUS W/TWL LRG LVL3 (GOWN DISPOSABLE) ×3
HEMOSTAT SURGICEL 2X14 (HEMOSTASIS) ×9 IMPLANT
IRRIGATION STRYKERFLOW (MISCELLANEOUS) ×2 IMPLANT
IRRIGATOR STRYKERFLOW (MISCELLANEOUS) ×3
KIT BASIN OR (CUSTOM PROCEDURE TRAY) ×3 IMPLANT
KIT CLEAN ENDO COMPLIANCE (KITS) ×3 IMPLANT
KIT TURNOVER KIT B (KITS) ×3 IMPLANT
MARKER SKIN DUAL TIP RULER LAB (MISCELLANEOUS) IMPLANT
NS IRRIG 1000ML POUR BTL (IV SOLUTION) ×3 IMPLANT
OBTURATOR OPTICAL STANDARD 8MM (TROCAR) ×1
OBTURATOR OPTICAL STND 8 DVNC (TROCAR) ×2
OBTURATOR OPTICALSTD 8 DVNC (TROCAR) ×2 IMPLANT
OIL SILICONE PENTAX (PARTS (SERVICE/REPAIRS)) ×3 IMPLANT
PACK CHEST (CUSTOM PROCEDURE TRAY) ×3 IMPLANT
PAD ARMBOARD 7.5X6 YLW CONV (MISCELLANEOUS) ×15 IMPLANT
PASSER SUT SWANSON 36MM LOOP (INSTRUMENTS) IMPLANT
PENCIL SMOKE EVACUATOR (MISCELLANEOUS) IMPLANT
POUCH RETRIEVAL ECOSAC 10 (ENDOMECHANICALS) ×2 IMPLANT
POUCH RETRIEVAL ECOSAC 10MM (ENDOMECHANICALS) ×1
RELOAD STAPLER 3.5X45 BLU DVNC (STAPLE) ×8 IMPLANT
SEAL CANN UNIV 5-8 DVNC XI (MISCELLANEOUS) ×4 IMPLANT
SEAL XI 5MM-8MM UNIVERSAL (MISCELLANEOUS) ×2
SEALANT PROGEL (MISCELLANEOUS) IMPLANT
SEALANT SURG COSEAL 4ML (VASCULAR PRODUCTS) IMPLANT
SEALANT SURG COSEAL 8ML (VASCULAR PRODUCTS) IMPLANT
SEALER LIGASURE MARYLAND 30 (ELECTROSURGICAL) IMPLANT
SET TUBE SMOKE EVAC HIGH FLOW (TUBING) ×3 IMPLANT
SHEET MEDIUM DRAPE 40X70 STRL (DRAPES) ×3 IMPLANT
SLEEVE SUCTION 125 (MISCELLANEOUS) ×3 IMPLANT
SOL ANTI FOG 6CC (MISCELLANEOUS) IMPLANT
SOLUTION ANTI FOG 6CC (MISCELLANEOUS)
SOLUTION ELECTROLUBE (MISCELLANEOUS) ×3 IMPLANT
STAPLER 45 DA VINCI SURE FORM (STAPLE) ×1
STAPLER 45 SUREFORM DVNC (STAPLE) ×2 IMPLANT
STAPLER CANNULA SEAL DVNC XI (STAPLE) ×4 IMPLANT
STAPLER CANNULA SEAL XI (STAPLE) ×2
STAPLER RELOAD 3.5X45 BLU DVNC (STAPLE) ×8
STAPLER RELOAD 3.5X45 BLUE (STAPLE) ×4
STOPCOCK 4 WAY LG BORE MALE ST (IV SETS) ×3 IMPLANT
SUT PROLENE 3 0 SH DA (SUTURE) IMPLANT
SUT PROLENE 4 0 RB 1 (SUTURE)
SUT PROLENE 4-0 RB1 .5 CRCL 36 (SUTURE) IMPLANT
SUT SILK  1 MH (SUTURE) ×3
SUT SILK 1 MH (SUTURE) ×6 IMPLANT
SUT SILK 1 TIES 10X30 (SUTURE) IMPLANT
SUT SILK 2 0SH CR/8 30 (SUTURE) IMPLANT
SUT VIC AB 1 CTX 18 (SUTURE) IMPLANT
SUT VIC AB 1 CTX 36 (SUTURE)
SUT VIC AB 1 CTX36XBRD ANBCTR (SUTURE) IMPLANT
SUT VIC AB 2-0 CTX 36 (SUTURE) ×3 IMPLANT
SUT VIC AB 3-0 X1 27 (SUTURE) ×9 IMPLANT
SUT VICRYL 0 TIES 12 18 (SUTURE) ×3 IMPLANT
SUT VICRYL 0 UR6 27IN ABS (SUTURE) ×6 IMPLANT
SUT VICRYL 2 TP 1 (SUTURE) IMPLANT
SYR 20ML ECCENTRIC (SYRINGE) ×3 IMPLANT
SYR 20ML LL LF (SYRINGE) ×3 IMPLANT
SYR 50ML LL SCALE MARK (SYRINGE) ×3 IMPLANT
SYR 5ML LL (SYRINGE) ×3 IMPLANT
SYSTEM SAHARA CHEST DRAIN ATS (WOUND CARE) ×3 IMPLANT
TAPE CLOTH 4X10 WHT NS (GAUZE/BANDAGES/DRESSINGS) ×3 IMPLANT
TAPE CLOTH SURG 4X10 WHT LF (GAUZE/BANDAGES/DRESSINGS) ×3 IMPLANT
TAPE UMBILICAL COTTON 1/8X30 (MISCELLANEOUS) IMPLANT
TIP APPLICATOR SPRAY EXTEND 16 (VASCULAR PRODUCTS) IMPLANT
TOWEL GREEN STERILE (TOWEL DISPOSABLE) ×6 IMPLANT
TOWEL GREEN STERILE FF (TOWEL DISPOSABLE) ×3 IMPLANT
TRAP FLUID SMOKE EVACUATOR (MISCELLANEOUS) ×3 IMPLANT
TRAP SPECIMEN MUCOUS 40CC (MISCELLANEOUS) IMPLANT
TRAY FOLEY MTR SLVR 16FR STAT (SET/KITS/TRAYS/PACK) ×3 IMPLANT
TROCAR XCEL 12X100 BLDLESS (ENDOMECHANICALS) ×3 IMPLANT
TROCAR XCEL BLADELESS 5X75MML (TROCAR) ×3 IMPLANT
TUBE CONNECTING 20X1/4 (TUBING) ×3 IMPLANT
TUBING EXTENTION W/L.L. (IV SETS) ×3 IMPLANT
VALVE BIOPSY  SINGLE USE (MISCELLANEOUS) ×1
VALVE BIOPSY SINGLE USE (MISCELLANEOUS) ×2 IMPLANT
VALVE SUCTION BRONCHIO DISP (MISCELLANEOUS) ×3 IMPLANT
WATER STERILE IRR 1000ML POUR (IV SOLUTION) ×3 IMPLANT

## 2020-01-09 NOTE — Plan of Care (Signed)
  Problem: Education: Goal: Knowledge of disease or condition will improve Outcome: Not Met (add Reason) Goal: Knowledge of the prescribed therapeutic regimen will improve Outcome: Not Met (add Reason)   Problem: Activity: Goal: Risk for activity intolerance will decrease Outcome: Not Met (add Reason)   Problem: Cardiac: Goal: Will achieve and/or maintain hemodynamic stability Outcome: Not Met (add Reason)   Problem: Clinical Measurements: Goal: Postoperative complications will be avoided or minimized Outcome: Not Met (add Reason)   Problem: Respiratory: Goal: Respiratory status will improve Outcome: Not Met (add Reason)   Problem: Pain Management: Goal: Pain level will decrease Outcome: Not Met (add Reason)   Problem: Skin Integrity: Goal: Wound healing without signs and symptoms infection will improve Outcome: Not Met (add Reason)   Goals not met yet due to new admission status

## 2020-01-09 NOTE — Anesthesia Procedure Notes (Signed)
Arterial Line Insertion Start/End3/22/2021 7:15 AM, 01/09/2020 7:20 AM Performed by: Epifanio Lesches, CRNA, CRNA  Preanesthetic checklist: patient identified, IV checked, site marked, risks and benefits discussed, surgical consent, monitors and equipment checked, pre-op evaluation, timeout performed and anesthesia consent Lidocaine 1% used for infiltration and patient sedated Left, radial was placed Catheter size: 20 G Hand hygiene performed  and maximum sterile barriers used  Allen's test indicative of satisfactory collateral circulation Attempts: 2 Procedure performed without using ultrasound guided technique. Following insertion, dressing applied and Biopatch. Post procedure assessment: normal  Patient tolerated the procedure well with no immediate complications.

## 2020-01-09 NOTE — Anesthesia Preprocedure Evaluation (Signed)
Anesthesia Evaluation  Patient identified by MRN, date of birth, ID band Patient awake    Reviewed: Allergy & Precautions, NPO status , Patient's Chart, lab work & pertinent test results  History of Anesthesia Complications (+) PONV  Airway Mallampati: II  TM Distance: >3 FB Neck ROM: Full    Dental no notable dental hx.    Pulmonary pneumonia, former smoker,    Pulmonary exam normal breath sounds clear to auscultation       Cardiovascular negative cardio ROS Normal cardiovascular exam Rhythm:Regular Rate:Normal     Neuro/Psych negative neurological ROS  negative psych ROS   GI/Hepatic negative GI ROS, Neg liver ROS,   Endo/Other  Hypothyroidism Morbid obesity  Renal/GU negative Renal ROS  negative genitourinary   Musculoskeletal negative musculoskeletal ROS (+)   Abdominal   Peds negative pediatric ROS (+)  Hematology negative hematology ROS (+)   Anesthesia Other Findings   Reproductive/Obstetrics negative OB ROS                             Anesthesia Physical Anesthesia Plan  ASA: III  Anesthesia Plan: General   Post-op Pain Management:    Induction: Intravenous  PONV Risk Score and Plan: 4 or greater and Dexamethasone, Ondansetron, Treatment may vary due to age or medical condition, Midazolam and Scopolamine patch - Pre-op  Airway Management Planned: Double Lumen EBT  Additional Equipment:   Intra-op Plan:   Post-operative Plan: Extubation in OR  Informed Consent: I have reviewed the patients History and Physical, chart, labs and discussed the procedure including the risks, benefits and alternatives for the proposed anesthesia with the patient or authorized representative who has indicated his/her understanding and acceptance.     Dental advisory given  Plan Discussed with: CRNA and Surgeon  Anesthesia Plan Comments:         Anesthesia Quick  Evaluation

## 2020-01-09 NOTE — Anesthesia Postprocedure Evaluation (Signed)
Anesthesia Post Note  Patient: Clayton Bibles  Procedure(s) Performed: VIDEO BRONCHOSCOPY (N/A ) XI ROBOTIC ASSISTED THORASCOPY-LUNG WEDGE BIOPSIES OF RIGHT LOWER, RIGHT MIDDLE AND RIGHT UPPER LOBE. (Right Chest)     Patient location during evaluation: PACU Anesthesia Type: General Level of consciousness: awake and alert Pain management: pain level controlled Vital Signs Assessment: post-procedure vital signs reviewed and stable Respiratory status: spontaneous breathing, nonlabored ventilation, respiratory function stable and patient connected to nasal cannula oxygen Cardiovascular status: blood pressure returned to baseline and stable Postop Assessment: no apparent nausea or vomiting Anesthetic complications: no    Last Vitals:  Vitals:   01/09/20 1212 01/09/20 1228  BP: 104/67 105/77  Pulse: 65 76  Resp: 15 19  Temp:  (!) 36.4 C  SpO2: 100% 95%    Last Pain:  Vitals:   01/09/20 1228  TempSrc:   PainSc: 6                  Rilee [place] S

## 2020-01-09 NOTE — Plan of Care (Signed)

## 2020-01-09 NOTE — Anesthesia Procedure Notes (Signed)
Anesthesia Procedure Image    

## 2020-01-09 NOTE — Anesthesia Procedure Notes (Signed)
Procedure Name: Intubation Date/Time: 01/09/2020 8:08 AM Performed by: Inda Coke, CRNA Pre-anesthesia Checklist: Patient identified, Emergency Drugs available, Suction available and Patient being monitored Patient Re-evaluated:Patient Re-evaluated prior to induction Oxygen Delivery Method: Circle System Utilized Preoxygenation: Pre-oxygenation with 100% oxygen Induction Type: IV induction Ventilation: Mask ventilation without difficulty and Oral airway inserted - appropriate to patient size Laryngoscope Size: Glidescope, 3 and Mac Grade View: Grade III Tube type: Oral Endobronchial tube: Double lumen EBT, Left and EBT position confirmed by fiberoptic bronchoscope and 37 Fr Number of attempts: 5 or more Airway Equipment and Method: Oral airway,  Video-laryngoscopy,  Rigid stylet and Bougie stylet Placement Confirmation: ETT inserted through vocal cords under direct vision,  positive ETCO2 and breath sounds checked- equal and bilateral Secured at: 28 cm Tube secured with: Tape Dental Injury: Teeth and Oropharynx as per pre-operative assessment  Difficulty Due To: Difficulty was unanticipated, Difficult Airway- due to large tongue, Difficult Airway- due to anterior larynx and Difficult Airway- due to reduced neck mobility Future Recommendations: Recommend- induction with short-acting agent, and alternative techniques readily available Comments: DL x1 by SRNA with Mac 3 blade, grade III view. DL x1 by CRNA with Mac 3 blade, grade III view. DLx2 w/ Glidescope mac 3 by MDA grade I view, unable to pass DLT. 7.0 single lumen tube passed successfully. Bougie placed, 37 DLT placed over bougie with successful intubation and lung isolation.

## 2020-01-09 NOTE — Brief Op Note (Addendum)
      [address]       [place] [address]             [phone]    01/09/2020  11:07 AM  PATIENT:  [name]  39 y.o. female  PRE-OPERATIVE DIAGNOSIS:  Interstitial lung disease  POST-OPERATIVE DIAGNOSIS:  Interstitial lung disease/ path pending   PROCEDURE:  VIDEO BRONCHOSCOPY, XI ROBOTIC ASSISTED THORASCOPY-LUNG WEDGE BIOPSIES OF RIGHT LOWER, RIGHT MIDDLE AND RIGHT UPPER LOBE. (Right), INTERCOSTAL NERVE BLOCK  SURGEON:  Surgeon(s) and Role:    Delight Ovens, MD - Primary    Corliss Skains, MD - Assisting  PHYSICIAN ASSISTANT: Doree Fudge PA-C  ANESTHESIA:   general  EBL:  20 mL   BLOOD ADMINISTERED:none  DRAINS: 28 French chest tube placed in the right pleural space   LOCAL MEDICATIONS USED:  OTHER Exparel  SPECIMEN:  Source of Specimen:  Lung biopsies RUL, RML, RLL  DISPOSITION OF SPECIMEN:  Culture and pathology  COUNTS CORRECT:  YES  DICTATION: .Dragon Dictation  PLAN OF CARE: Admit to inpatient   PATIENT DISPOSITION:  PACU - hemodynamically stable.   Delay start of Pharmacological VTE agent (>24hrs) due to surgical blood loss or risk of bleeding: yes

## 2020-01-09 NOTE — Anesthesia Procedure Notes (Signed)
Central Venous Catheter Insertion Performed by: Eilene Ghazi, MD, anesthesiologist Start/End3/22/2021 6:45 AM, 01/09/2020 6:55 AM Patient location: Pre-op. Preanesthetic checklist: patient identified, IV checked, site marked, risks and benefits discussed, surgical consent, monitors and equipment checked, pre-op evaluation, timeout performed and anesthesia consent Position: Trendelenburg Lidocaine 1% used for infiltration and patient sedated Hand hygiene performed  and maximum sterile barriers used  Catheter size: 8 Fr Total catheter length 16. Central line was placed.Double lumen Procedure performed using ultrasound guided technique. Ultrasound Notes:anatomy identified, needle tip was noted to be adjacent to the nerve/plexus identified, no ultrasound evidence of intravascular and/or intraneural injection and image(s) printed for medical record Attempts: 1 Following insertion, dressing applied, line sutured and Biopatch. Post procedure assessment: blood return through all ports and no air  Patient tolerated the procedure well with no immediate complications.

## 2020-01-09 NOTE — H&P (Addendum)
[name]       [name],[name] [id number]             [phone]                    Kymiah C Swopes [place] Medical Record #[id number] Date of Birth: Jul 20, 1981 Referring/ Pulmonary: Devona Konig, MD Primary Care: [name], MD Primary Cardiologist: No primary care provider on file.   Chief Complaint:    Chief Complaint  Patient presents with   Interstitial Lung Disease    Further discuss surgery scheduled for 01/09/20      History of Present Illness:    [name] 39 y.o. female  seen in the office at the request of Dr. Humphrey Rolls consider repeat lung biopsy.  In February 2009 the patient presented with severe shortness of breath and a CT scan consistent with interstitial pneumonitis.  At that time Dr. Arlyce Dice did a right video-assisted thoracoscopy with biopsy of the right upper middle and lower lobes.  Eosinophilic pneumonitis was diagnosed, the patient was treated with steroids at that time and significantly improved.  Her symptoms subsided until March 2020 when she developed flulike and pneumonia symptoms.  She was started on high-dose steroids which have been tapered down to 15 mg prednisone a day now.  Overall the patient notes she has felt better, but CT scans 2 months ago to in some months ago have not shown much improvement in interstitial disease.   She was referred by Pulmonary for repeat lung bx.    Current Activity/ Functional Status:  Patient is independent with mobility/ambulation, transfers, ADL's, IADL's.   Zubrod Score: At the time of surgery this patients most appropriate activity status/level should be described as: _0     0    Normal activity, no symptoms _1     1    Restricted in physical strenuous activity but ambulatory, able to do out light work _2     2    Ambulatory and capable of self care, unable to do work activities, up and about               >50 % of waking hours                              _3     3    Only  limited self care, in bed greater than 50% of waking hours _4     4    Completely disabled, no self care, confined to bed or chair _5     5    Moribund   Past Medical History:  Diagnosis Date   Anemia    Iron Deficiency in the past   Asthma    in childhood   Bowen's disease    Complication of anesthesia    HARD TO WAKE UP SOMETIIMES   Dyspnea 04/2019   sob and coughing has increased of late   Endometriosis    dx'd in high school   Eosinophilic pneumonia ~76/1607   per Dr. Clayborn Bigness s/p VATS and 1 year of prednisone   GERD (gastroesophageal reflux disease)    TUMS PRN   Headache    MIGRAINES without aura,seldom   History of abnormal Pap smear    History of chicken pox    Hypothyroidism    dx'd 2011   Pneumonia 2009/ 3710   EOSINOPHILIC PNEUMONIA   PONV (postoperative nausea  and vomiting)    x1   Seizures ([place])    childhood-previously on Tegretol-been off meds for years with no seizures   Urinary tract bacterial infections     Past Surgical History:  Procedure Laterality Date   CESAREAN SECTION  2007   CHOLECYSTECTOMY N/A 10/27/2016   Procedure: LAPAROSCOPIC CHOLECYSTECTOMY;  Surgeon: Jules Husbands, MD;  Location: [facility] ORS;  Service: General;  Laterality: N/A;   DIAGNOSTIC LAPAROSCOPY  2018   X2   FLEXIBLE BRONCHOSCOPY N/A 05/03/2019   Procedure: FLEXIBLE BRONCHOSCOPY;  Surgeon: Allyne Gee, MD;  Location: [facility] ORS;  Service: Pulmonary;  Laterality: N/A;   LUNG BIOPSY Right 2008   partial right lung resection.  was treated with steroids x 1 year   TONSILLECTOMY AND ADENOIDECTOMY      Family History  Problem Relation Age of Onset   Mental illness Mother        bipolar   Cancer Father        h/o throat cancer- treated   Heart disease Maternal Grandfather        at ate >60   Alcohol abuse Maternal Grandfather    Stomach cancer Neg Hx    Rectal cancer Neg Hx    Esophageal cancer Neg Hx    Colon cancer Neg Hx    Breast cancer Neg  Hx      Social History   Tobacco Use  Smoking Status Former Smoker   Packs/day: 0.50   Years: 10.00   Pack years: 5.00   Types: Cigarettes   Quit date: 10/21/2004   Years since quitting: 15.2  Smokeless Tobacco Never Used    Social History   Substance and Sexual Activity  Alcohol Use Yes   Comment: rare     No Known Allergies  Current Facility-Administered Medications  Medication Dose Route Frequency Provider Last Rate Last Admin   ceFAZolin (ANCEF) 2-4 GM/100ML-% IVPB            ceFAZolin (ANCEF) IVPB 2g/100 mL premix  2 g Intravenous 30 min Pre-Op Grace Isaac, MD          Review of Systems:     Cardiac Review of Systems: [Y] = yes  or   [ N ] = no   Chest Pain [ N   ]  Resting SOB [ Y ] Exertional SOB  [Y  ]  Orthopnea [ N ]   Pedal Edema [ N  ]    Palpitations Aqua.Slicker  ] Syncope  [ N ]   Presyncope Aqua.Slicker   ]   General Review of Systems: [Y] = yes [  ]=no Constitional: recent weight change [  ];  Wt loss over the last 3 months [   ] anorexia [  ]; fatigue [  ]; nausea [  ]; night sweats [  ]; fever [  ]; or chills [  ];           Eye : blurred vision [  ]; diplopia [   ]; vision changes [  ];  Amaurosis fugax[  ]; Resp: cough [  ];  wheezing[  ];  hemoptysis[  ]; shortness of breath[  ]; paroxysmal nocturnal dyspnea[  ]; dyspnea on exertion[  ]; or orthopnea[  ];  GI:  gallstones[  ], vomiting[  ];  dysphagia[  ]; melena[  ];  hematochezia [  ]; heartburn[  ];   Hx of  Colonoscopy[  ]; GU: kidney stones [  ];  hematuria[  ];   dysuria [  ];  nocturia[  ];  history of     obstruction [  ]; urinary frequency [  ]             Skin: rash, swelling[  ];, hair loss[  ];  peripheral edema[  ];  or itching[  ]; Musculosketetal: myalgias[  ];  joint swelling[  ];  joint erythema[  ];  joint pain[  ];  back pain[  ];  Heme/Lymph: bruising[  ];  bleeding[  ];  anemia[  ];  Neuro: TIA[  ];  headaches[  ];  stroke[  ];  vertigo[  ];  seizures[  ];   paresthesias[  ];   difficulty walking[  ];  Psych:depression[  ]; anxiety[  ];  Endocrine: diabetes[  ];  thyroid dysfunction[  ];  Immunizations: Flu up to date Blue.Reese  ]; Pneumococcal up to date [ y ];  Other:     PHYSICAL EXAMINATION: BP 126/81    Pulse 76    Temp 98.1 F (36.7 C) (Oral)    Resp 18    Ht _0  (1.575 m)    Wt 103.1 kg    LMP 01/06/2020    SpO2 100%    BMI 41.57 kg/m  General appearance: alert, cooperative and no distress Head: Normocephalic, without obvious abnormality, atraumatic Lymph nodes: Cervical, supraclavicular, and axillary nodes normal. Resp: diminished breath sounds bibasilar Cardio: regular rate and rhythm, S1, S2 normal, no murmur, click, rub or gallop GI: soft, non-tender; bowel sounds normal; no masses,  no organomegaly Extremities: extremities normal, atraumatic, no cyanosis or edema and Homans sign is negative, no sign of DVT Neurologic: Grossly normal  Diagnostic Studies & Laboratory data:     Recent Radiology Findings:   CT Chest High Resolution  Result Date: 12/30/2019 CLINICAL DATA:  Reported history of chronic eosinophilic pneumonia with partial right lung resection in 2008, on prednisone therapy. Follow-up interstitial lung disease. Dyspnea on exertion, chronic. Remote smoking history. Reported influenza pneumonia in March 2020. EXAM: CT CHEST WITHOUT CONTRAST TECHNIQUE: Multidetector CT imaging of the chest was performed following the standard protocol without intravenous contrast. High resolution imaging of the lungs, as well as inspiratory and expiratory imaging, was performed. COMPARISON:  09/27/2019 high-resolution chest CT. 11/22/2019 chest radiograph. FINDINGS: Cardiovascular: Normal heart size. No significant pericardial effusion/thickening. Great vessels are normal in course and caliber. Mediastinum/Nodes: No discrete thyroid nodules. Unremarkable esophagus. No pathologically enlarged axillary, mediastinal or hilar lymph nodes, noting limited sensitivity for  the detection of hilar adenopathy on this noncontrast study. Lungs/Pleura: No pneumothorax. No pleural effusion. Wedge resection suture lines again noted at the peripheral right lung base. No acute consolidative airspace disease, lung masses or significant pulmonary nodules. No significant air trapping or evidence of tracheobronchomalacia on the expiration sequence. There is patchy peribronchovascular and peripheral ground-glass opacity and reticulation throughout both lungs with a strong basilar predominance with associated moderate traction bronchiectasis, volume loss and architectural distortion. No frank honeycombing. Findings have not appreciably changed in the short interval since 09/27/2019 CT. Findings have clearly progressed compared to more remote chest CT study of 09/27/2012. Upper abdomen: Cholecystectomy. Musculoskeletal:  No aggressive appearing focal osseous lesions. IMPRESSION: Stable chest CT in the short interval since 09/27/2019. Spectrum of findings compatible with basilar predominant fibrotic interstitial lung disease with moderate traction bronchiectasis and no honeycombing. Findings have clearly progressed compared to more remote chest CT study of 09/27/2012. Findings are categorized as probable UIP per  consensus guidelines: Diagnosis of Idiopathic Pulmonary Fibrosis: An Official ATS/ERS/JRS/ALAT Clinical Practice Guideline. [place], Iss 5, [phone], Jun 20 2017. Electronically Signed   By: Ilona Sorrel M.D.   On: 12/30/2019 14:37   I have independently reviewed the above radiology studies  and reviewed the findings with the patient.      CLINICAL DATA:  Interstitial lung disease  EXAM: CT CHEST WITHOUT CONTRAST  TECHNIQUE: Multidetector CT imaging of the chest was performed following the standard protocol without intravenous contrast. High resolution imaging of the lungs, as well as inspiratory and expiratory imaging, was performed.  COMPARISON:   06/28/2019, 04/21/2019, 09/27/2012  FINDINGS: Cardiovascular: No significant vascular findings. Normal heart size. No pericardial effusion.  Mediastinum/Nodes: No enlarged mediastinal, hilar, or axillary lymph nodes. Thyroid gland, trachea, and esophagus demonstrate no significant findings.  Lungs/Pleura: No significant interval change in bibasilar predominant pattern of moderate fibrosis featuring extensive bronchiectasis and volume loss of the lower lobes, and extensive associated ground-glass opacity, most conspicuous in the right lower lobe. Redemonstrated postoperative findings of right lung wedge biopsies. No pleural effusion or pneumothorax.  Upper Abdomen: No acute abnormality.  Musculoskeletal: No chest wall mass or suspicious bone lesions identified.  IMPRESSION: No significant interval change in bibasilar predominant pattern of fibrosis featuring extensive bronchiectasis and volume loss of the lower lobes and extensive associated ground-glass opacity, most conspicuous in the right lower lobe. Redemonstrated postoperative findings of right lung wedge biopsies. As noted on prior examination, these findings have worsened over time in comparison to remote prior examination dated 2013 and by report patient has a biopsy diagnosis of chronic eosinophilic pneumonia. No acute airspace disease at this time.   Electronically Signed   By: Eddie Candle M.D.   On: 09/27/2019 10:33   I have independently reviewed the above radiology studies  and reviewed the findings with the patient.   Recent Lab Findings: Lab Results  Component Value Date   WBC 11.9 (H) 01/05/2020   HGB 11.5 (L) 01/05/2020   HCT 37.8 01/05/2020   PLT 298 01/05/2020   GLUCOSE 99 01/05/2020   CHOL 148 04/28/2019   TRIG 149.0 04/28/2019   HDL 42.50 04/28/2019   LDLCALC 76 04/28/2019   ALT 18 01/05/2020   AST 25 01/05/2020   NA 140 01/05/2020   K 3.8 01/05/2020   CL 108 01/05/2020    CREATININE 0.71 01/05/2020   BUN 8 01/05/2020   CO2 21 (L) 01/05/2020   TSH 2.919 10/10/2019   INR 1.0 01/05/2020      Assessment / Plan:   Patient with previous history of eosinophilic pneumonitis treated with steroids-with significant improvement over 12 years ago  until recently with return of symptoms-patient is referred for lung biopsy to confirm recurrence of previous problem versus different diagnosis.  I have reviewed the most current CT scan with the patient and the plan for repeat.  Risks and options are reviewed with the patient in detail.  Although she has had surgery on the right before the right lung years most significantly affected and will more likely be more representative of underlying disease process.  I recommended to her that we proceed with robotic assisted right lung biopsy \.   The goals risks and alternatives of the planned surgical procedure Procedure(s): VIDEO BRONCHOSCOPY (N/A) XI ROBOTIC ASSISTED THORASCOPY-LUNG BIOPSY (Right)  have been discussed with the patient in detail. The risks of the procedure including death, infection, stroke, myocardial infarction, bleeding, blood transfusion have  all been discussed specifically.  I have quoted Starling Manns a 1 % of perioperative mortality and a complication rate as high as 10 %. The patient's questions have been answered.Starling Manns is willing  to proceed with the planned procedure.    Grace Isaac MD      [name] ,[place] [address] Office [phone]     01/09/2020 7:18 AM

## 2020-01-09 NOTE — Transfer of Care (Signed)
Immediate Anesthesia Transfer of Care Note  Patient: [name]  Procedure(s) Performed: VIDEO BRONCHOSCOPY (N/A ) XI ROBOTIC ASSISTED THORASCOPY-LUNG WEDGE BIOPSIES OF RIGHT LOWER, RIGHT MIDDLE AND RIGHT UPPER LOBE. (Right Chest)  Patient Location: PACU  Anesthesia Type:General  Level of Consciousness: awake, oriented, drowsy and patient cooperative  Airway & Oxygen Therapy: Patient Spontanous Breathing and Patient connected to face mask oxygen  Post-op Assessment: Report given to RN and Post -op Vital signs reviewed and stable  Post vital signs: Reviewed and stable  Last Vitals:  Vitals Value Taken Time  BP 113/86 01/09/20 1128  Temp    Pulse 77 01/09/20 1130  Resp 25 01/09/20 1130  SpO2 100 % 01/09/20 1130  Vitals shown include unvalidated device data.  Last Pain:  Vitals:   01/09/20 0622  TempSrc: Oral  PainSc:          Complications: No apparent anesthesia complications

## 2020-01-10 ENCOUNTER — Inpatient Hospital Stay (HOSPITAL_COMMUNITY): Payer: 59

## 2020-01-10 DIAGNOSIS — Z09 Encounter for follow-up examination after completed treatment for conditions other than malignant neoplasm: Secondary | ICD-10-CM

## 2020-01-10 LAB — BASIC METABOLIC PANEL
Anion gap: 5 (ref 5–15)
BUN: <5 mg/dL — ABNORMAL LOW (ref 6–20)
CO2: 28 mmol/L (ref 22–32)
Calcium: 8.1 mg/dL — ABNORMAL LOW (ref 8.9–10.3)
Chloride: 106 mmol/L (ref 98–111)
Creatinine, Ser: 0.65 mg/dL (ref 0.44–1.00)
GFR calc Af Amer: >60 mL/min (ref >60)
GFR calc non Af Amer: >60 mL/min (ref >60)
Glucose, Bld: 120 mg/dL — ABNORMAL HIGH (ref 70–99)
Potassium: 3.9 mmol/L (ref 3.5–5.1)
Sodium: 139 mmol/L (ref 135–145)

## 2020-01-10 LAB — ACID FAST SMEAR (AFB, MYCOBACTERIA): Acid Fast Smear: NEGATIVE

## 2020-01-10 LAB — CBC
HCT: 33.7 % — ABNORMAL LOW (ref 36.0–46.0)
Hemoglobin: 10.2 g/dL — ABNORMAL LOW (ref 12.0–15.0)
MCH: 25.4 pg — ABNORMAL LOW (ref 26.0–34.0)
MCHC: 30.3 g/dL (ref 30.0–36.0)
MCV: 84 fL (ref 80.0–100.0)
Platelets: 272 10*3/uL (ref 150–400)
RBC: 4.01 MIL/uL (ref 3.87–5.11)
RDW: 14.8 % (ref 11.5–15.5)
WBC: 12.9 10*3/uL — ABNORMAL HIGH (ref 4.0–10.5)
nRBC: 0 % (ref 0.0–0.2)

## 2020-01-10 MED ORDER — ACETAMINOPHEN 500 MG PO TABS: 1000.0000 mg | ORAL_TABLET | Freq: Four times a day (QID) | ORAL | 0 refills | Status: DC | PRN

## 2020-01-10 NOTE — Discharge Summary (Signed)
Physician Discharge Summary  Patient ID: [name] MRN: [id number] DOB/AGE: 01/31/1981 39 y.o.  Admit date: 01/09/2020 Discharge date: 01/11/2020  Admission Diagnoses:  Patient Active Problem List   Diagnosis Date Noted  . Interstitial lung disease ([place]) 01/09/2020  . Routine general medical examination at a health care facility 05/08/2019  . Lower respiratory infection 01/26/2019  . Lumbar radicular pain 12/21/2017  . Advance care planning 05/04/2017  . Calculus of gallbladder without cholecystitis without obstruction 08/05/2016  . Migraine without aura 04/18/2012  . Cough 02/06/2012  . Eosinophilic pneumonia 78/29/5621  . Hypothyroidism 02/07/2011   Discharge Diagnoses:   Patient Active Problem List   Diagnosis Date Noted  . S/P Robotic Assisted Right side lung biopsy (wedge RUL, RML, RLL) 01/10/2020  . Interstitial lung disease ([place]) 01/09/2020  . Routine general medical examination at a health care facility 05/08/2019  . Lower respiratory infection 01/26/2019  . Lumbar radicular pain 12/21/2017  . Advance care planning 05/04/2017  . Calculus of gallbladder without cholecystitis without obstruction 08/05/2016  . Migraine without aura 04/18/2012  . Cough 02/06/2012  . Eosinophilic pneumonia 30/86/5784  . Hypothyroidism 02/07/2011   Discharged Condition: good  History of Present Illness:   Mrs.  [name] is a 39 you female referred to [facility] for evaluation by Dr. Humphrey Rolls.  The patient originally presented with severe shortness of breath in 2009.  CT scan at that time showed interstitial pneumonitis.  She underwent a Right VATS with biopsies of the right upper, middle and lower lobes.  She was diagnosed with Eosinophilic pneumonitis.  She was treated with steroids with significant improvement.  In March 2020 she developed flu like and pneumonia symptoms.  She was treated with high dose steroids, which have been tapered down to 15 mg daily.  She feels better overall, but  CT scans obtained did not show much improvement in her interstitial disease.  Due to this she is referred for repeat lung biopsy.  She was evaluated by Dr. Servando Snare who offered repeat biopsies with a robotic assisted approach.  The risks and benefits of the procedure were explained to the patient and she was agreeable to proceed.  Hospital Course:   [name] presented to [facility] on 01/09/2020.  She underwent  Bronchoscopy, right robotic-assisted wedge resection of right upper lobe, right middle lobe, and right lower lobe. Intercostal nerve block.  She tolerated the procedure without difficulty, was extubated and taken to the PACU in stable condition.  She has done well post operatively.  Her CXR showed trace apical pneuomothorax.  This improved and her chest tube was free from air leak.  Her chest tube was removed on 01/11/2020.  Follow up CXR showed both lungs well expanded with no pneumothorax..  She is ambulating independently.  Her pain is well controlled.  She is medically stable for discharge home today.   Significant Diagnostic Studies: radiology:   CT scan: Stable chest CT in the short interval since 09/27/2019. Spectrum of findings compatible with basilar predominant fibrotic interstitial lung disease with moderate traction bronchiectasis and no honeycombing. Findings have clearly progressed compared to more remote chest CT study of 09/27/2012. Findings are categorized as probable UIP per consensus guidelines: Diagnosis of Idiopathic Pulmonary Fibrosis: An Official ATS/ERS/JRS/ALAT Clinical Practice Guideline. [place], Iss 5, [phone], Jun 20 2017.  Treatments:   Bronchoscopy, right robotic-assisted wedge resection of right upper lobe, right middle lobe, and right lower lobe. Intercostal nerve block  Discharge Exam: Blood pressure 104/73, pulse 65, temperature 97.7 F (36.5 C), temperature source Oral, resp. rate 20, height 5\' 2"  (1.575  m), weight 103.1 kg, last menstrual period 01/06/2020, SpO2 97 %.  General appearance: alert, cooperative and no distress Neurologic: intact Heart: regular rate and rhythm, S1, S2 normal, no murmur, click, rub or gallop Lungs: diminished breath sounds bibasilar Abdomen: soft, non-tender; bowel sounds normal; no masses,  no organomegaly Extremities: extremities normal, atraumatic, no cyanosis or edema and Homans sign is negative, no sign of DVT Wound: no air leak from chest tube   Discharge medications:   Allergies as of 01/11/2020   No Known Allergies     Medication List    TAKE these medications   acetaminophen 500 MG tablet Commonly known as: TYLENOL Take 2 tablets (1,000 mg total) by mouth every 6 (six) hours as needed.   albuterol 108 (90 Base) MCG/ACT inhaler Commonly known as: VENTOLIN HFA Inhale 2 puffs into the lungs every 4 (four) hours as needed for wheezing or shortness of breath.   azithromycin 250 MG tablet Commonly known as: ZITHROMAX Take 1 tab po daily What changed:   how much to take  how to take this  when to take this  additional instructions   levothyroxine 125 MCG tablet Commonly known as: SYNTHROID TAKE 1 TABLET BY MOUTH ONCE DAILY EXCEPT TAKE 1 AND 1/2 TABLETS ON SUNDAYS What changed:   how much to take  how to take this  when to take this  additional instructions   oxyCODONE 5 MG immediate release tablet Commonly known as: Oxy IR/ROXICODONE Take 1 tablet (5 mg total) by mouth every 4 (four) hours as needed for severe pain.   predniSONE 5 MG tablet Commonly known as: DELTASONE Take as directed What changed:   how much to take  how to take this  when to take this  additional instructions   predniSONE 20 MG tablet Commonly known as: DELTASONE TAKE 1 TABLET(20 MG) BY MOUTH DAILY WITH BREAKFAST What changed: Another medication with the same name was changed. Make sure you understand how and when to take each.   voriconazole  200 MG tablet Commonly known as: VFEND Take 1 tablet (200 mg total) by mouth 2 (two) times daily.      Follow-up Information    01/13/2020, MD Follow up on 01/26/2020.   Specialty: Cardiothoracic Surgery Why: Appointment is at 3:00, please get CXR at 2:30 at [facility] located on first floor of our office building Contact information: [address] [place] [phone]           Signed:  [phone], PA-C 01/11/2020, 7:41 AM   Edited by 01/13/2020. Judie Petit 01/11/20 10:44am

## 2020-01-10 NOTE — Plan of Care (Signed)

## 2020-01-10 NOTE — Op Note (Signed)
[name], [name] MEDICAL RECORD NW:[id number] ACCOUNT [id number] DATE OF BIRTH:07/28/1981 FACILITY: [facility] LOCATION: [facility] PHYSICIAN:Helen Cuff Bari Burdette Gergely, MD  OPERATIVE REPORT  DATE OF PROCEDURE:  01/09/2020  PREOPERATIVE DIAGNOSIS:  Question of interstitial lung disease.  POSTOPERATIVE DIAGNOSIS:  Question of interstitial lung disease.  SURGICAL PROCEDURE:  Bronchoscopy, right robotic-assisted wedge resection of right upper lobe, right middle lobe, and right lower lobe. Intercostal nerve block   SURGEON:  Sheliah Plane, MD  FIRST ASSISTANT:  Dr. 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  The patient was treated with a course of steroids at that time and had significant improvement and until the  fall of 2020 had no further problems and did not require steroids.  She began having more cough and shortness of breath and was restarted on steroids.  Repeat scans showed progression of interstitial lung markings compared to a scan in 2013.  The patient  was referred by Dr. Welton Flakes for consideration of repeat biopsy of the lung.  Although the patient had previously been operated on on the right side, on CT scan the right lung appeared to be more diffusely involved than the left, so we elected to proceed  with right lung biopsy.  Risks and options were discussed with the patient.  She agreed and signed informed consent.  DESCRIPTION OF PROCEDURE:  With IVs and arterial line in place, the patient underwent general endotracheal anesthesia.  The patient did have some difficulty having the double-lumen endotracheal tube placed.  Initially, a single-lumen tube was placed over  a dilator.  With a double lumen endotracheal tube in place appropriate timeout was performed and we proceeded with bronchoscopy through the double lumen endotracheal  tube confirming its proper position.  There were no endobronchial lesions noted.  The  scope was removed.  The patient was turned in the lateral decubitus position with the right side up.  The right side had preoperatively been marked.  The right chest was prepped with Betadine, draped in a sterile manner.  We had marked the previous  incision and chest tube sites.  We then approximated the mid axillary line, 6th intercostal space infiltrated with Exparel, and then made a small incision through the skin with a 0-degree scope and Optiview port, entered the right pleural space.  Mild  insufflation was started.  Once we confirmed that we were in the intrathoracic space and away from the lung we then placed a more anterior 8 mm robotic port and a more posterior 12 mm robotic port under direct vision.  Using the scope as guidance the  previously placed Optiview port was replaced with an 8 mm robotic port.  A 12 mm accessory port was placed along the mid axillary line slightly lower than the camera port.  With the ports in place and adequate insufflation we then docked the [employer]  robot and initially proceeded to take down the numerous adhesions from the previous incisions and chest tubes.  This was somewhat tedious, but we were able to free the lung and identify middle, upper, and lower lobes.  The lung was significantly stuck to  the diaphragm.  With now the lung dissected down, we proceeded with lung biopsies.  A portion of the middle lobe was selected and using a 45 stapler through the posterior 12 mm port, a portion of the right middle lobe  was wedged and extracted in a small  specimen bag through the accessory port.  This portion of the lung was divided and a portion sent for cultures and the remaining to pathology for permanent section.  The upper lobe was less involved radiographically.  Portion of the more normal  appearing upper lobe was also wedged in a similar fashion with a 45 stapler and the  specimen removed.  The lower lobe in a similar fashion using a 45 stapler, a wedge biopsy of the lower lobe was done.  With all 3 specimens removed and the operative  field hemostatic, we then undocked the robot using a mixture of Exparel, saline, and piperocaine intercostal nerve blocks were done through the port.  Through the camera port we placed a 28 chest tube and reinflated the lung.  The remaining ports were  closed with UR-6 and 3-0 subcuticular stitch.  Dermabond was applied.  The patient tolerated the procedure with minimal blood loss.  She was extubated in the operating room and transferred to the recovery room for further postoperative care.  Sponge and  needle count was reported as correct at completion of procedure.  CN/NUANCE  D:01/10/2020 T:01/10/2020 JOB:[id number]

## 2020-01-10 NOTE — Discharge Instructions (Signed)
Video-Assisted Thoracic Surgery, Care After This sheet gives you information about how to care for yourself after your procedure. Your health care provider may also give you more specific instructions. If you have problems or questions, contact your health care provider. What can I expect after the procedure? After the procedure, it is common to have:  Some pain and soreness in your chest.  Pain when breathing in (inhaling) and coughing.  Constipation.  Fatigue.  Difficulty sleeping. Follow these instructions at home: Preventing pneumonia  Take deep breaths or do breathing exercises as instructed by your health care provider. Doing this helps prevent lung infection (pneumonia).  Cough frequently. Coughing may cause discomfort, but it is important to clear mucus (phlegm) and expand your lungs. If it hurts to cough, hold a pillow against your chest or place the palms of both hands on top of the incision (use splinting) when you cough. This may help relieve discomfort.  If you were given an incentive spirometer, use it as directed. An incentive spirometer is a tool that measures how well you are filling your lungs with each breath.  Participate in pulmonary rehabilitation as directed by your health care provider. This is a program that combines education, exercise, and support from a team of specialists. The goal is to help you heal and get back to your normal activities as soon as possible. Medicines  Take over-the-counter or prescription medicines only as told by your health care provider.  If you have pain, take pain-relieving medicine before your pain becomes severe. This is important because if your pain is under control, you will be able to breathe and cough more comfortably.  If you were prescribed an antibiotic medicine, take it as told by your health care provider. Do not stop taking the antibiotic even if you start to feel better. Activity  Ask your health care provider what  activities are safe for you.  Avoid activities that use your chest muscles for at least 3-4 weeks.  Do not lift anything that is heavier than 10 lb (4.5 kg), or the limit that your health care provider tells you, until he or she says that it is safe. Incision care  Follow instructions from your health care provider about how to take care of your incision(s). Make sure you: ? Wash your hands with soap and water before you change your bandage (dressing). If soap and water are not available, use hand sanitizer. ? Change your dressing as told by your health care provider. ? Leave stitches (sutures), skin glue, or adhesive strips in place. These skin closures may need to stay in place for 2 weeks or longer. If adhesive strip edges start to loosen and curl up, you may trim the loose edges. Do not remove adhesive strips completely unless your health care provider tells you to do that.  Keep your dressing dry until it has been removed.  Check your incision area every day for signs of infection. Check for: ? Redness, swelling, or pain. ? Fluid or blood. ? Warmth. ? Pus or a bad smell. Bathing  Do not take baths, swim, or use a hot tub until your health care provider approves. You may take showers.  After your dressing has been removed, use soap and water to gently wash your incision area. Do not use anything else to clean your incision(s) unless your health care provider tells you to do this. Driving   Do not drive until your health care provider approves.  Do not drive or   use heavy machinery while taking prescription pain medicine. Eating and drinking  Eat a healthy, balanced diet as instructed by your health care provider. A healthy diet includes plenty of fresh fruits and vegetables, whole grains, and low-fat (lean) proteins.  Limit foods that are high in fat and processed sugars, such as fried and sweet foods.  Drink enough fluid to keep your urine clear or pale yellow. General  instructions   To prevent or treat constipation while you are taking prescription pain medicine, your health care provider may recommend that you: ? Take over-the-counter or prescription medicines. ? Eat foods that are high in fiber, such as beans, fresh fruits and vegetables, and whole grains.  Do not use any products that contain nicotine or tobacco, such as cigarettes and e-cigarettes. If you need help quitting, ask your health care provider.  Avoid secondhand smoke.  Wear compression stockings as told by your health care provider. These stockings help to prevent blood clots and reduce swelling in your legs.  If you have a chest tube, care for it as instructed by your health care provider. Do not travel by airplane during the 2 weeks after your chest tube is removed, or until your health care provider says that this is safe.  Keep all follow-up visits as told by your health care provider. This is important. Contact a health care provider if:  You have redness, swelling, or pain around an incision.  You have fluid or blood coming from an incision.  Your incision area feels warm to the touch.  You have pus or a bad smell coming from an incision.  You have a fever or chills.  You have nausea or vomiting.  You have pain that does not get better with medicine. Get help right away if:  You have chest pain.  Your heart is fluttering or beating rapidly.  You develop a rash.  You have shortness of breath or trouble breathing.  You are confused.  You have trouble speaking.  You feel weak, light-headed, or dizzy.  You faint. Summary  To help prevent lung infection (pneumonia), take deep breaths or do breathing exercises as instructed by your health care provider.  Cough frequently to clear mucus (phlegm) and expand your lungs. If it hurts to cough, hold a pillow against your chest or place the palms of both hands on top of the incision (use splinting) when you cough.  If  you have pain, take pain-relieving medicine before your pain becomes severe. This is important because if your pain is under control, you will be able to breathe and cough more comfortably.  Ask your health care provider what activities are safe for you. This information is not intended to replace advice given to you by your health care provider. Make sure you discuss any questions you have with your health care provider. Document Revised: 09/18/2017 Document Reviewed: 09/15/2016 Elsevier Patient Education  2020 Elsevier Inc.  

## 2020-01-10 NOTE — Progress Notes (Signed)
[address]       [place] [address]             [phone]                 1 Day Post-Op Procedure(s) (LRB): VIDEO BRONCHOSCOPY (N/A) XI ROBOTIC ASSISTED THORASCOPY-LUNG WEDGE BIOPSIES OF RIGHT LOWER, RIGHT MIDDLE AND RIGHT UPPER LOBE. (Right)  LOS: 1 day   Subjective: Feels well, good pain control, patient wants to get up and walk   Objective: Vital signs in last 24 hours: Patient Vitals for the past 24 hrs:  BP Temp Temp src Pulse Resp SpO2  01/10/20 0724 98/72 97.8 F (36.6 C) Oral 77 19 98 %  01/10/20 0350 100/61 97.8 F (36.6 C) Oral -- 17 --  01/09/20 2300 101/64 98.2 F (36.8 C) Oral 78 14 99 %  01/09/20 1900 105/69 98.1 F (36.7 C) Oral 92 (!) 25 97 %  01/09/20 1700 -- (!) 97.5 F (36.4 C) Oral (!) 40 (!) 25 100 %  01/09/20 1641 -- 98 F (36.7 C) -- -- -- --  01/09/20 1628 98/67 -- -- 67 13 93 %  01/09/20 1558 100/74 -- -- 82 19 93 %  01/09/20 1528 95/71 -- -- 72 17 93 %  01/09/20 1428 -- -- -- 81 18 94 %  01/09/20 1328 98/74 -- -- 72 17 97 %  01/09/20 1228 105/77 (!) 97.5 F (36.4 C) -- 76 19 95 %  01/09/20 1212 104/67 -- -- 65 15 100 %  01/09/20 1158 103/66 -- -- 62 18 100 %  01/09/20 1142 101/74 -- -- 75 19 100 %  01/09/20 1128 113/86 (!) 97 F (36.1 C) -- 95 20 100 %    Filed Weights   01/09/20 0622  Weight: 103.1 kg    Hemodynamic parameters for last 24 hours:    Intake/Output from previous day: 03/22 0701 - 03/23 0700 In: 2170.3 [I.V.:1970.3; IV Piggyback:200] Out: 2038 [Urine:1850; Blood:20; Chest Tube:168] Intake/Output this shift: Total I/O In: 240 [P.O.:240] Out: 475 [Urine:475]  Scheduled Meds: . acetaminophen  1,000 mg Oral Q6H   Or  . acetaminophen (TYLENOL) oral liquid 160 mg/5 mL  1,000 mg Oral Q6H  . bisacodyl  10 mg Oral Daily  . Chlorhexidine Gluconate Cloth  6 each Topical Daily  . enoxaparin (LOVENOX) injection  40 mg Subcutaneous Q24H  . levothyroxine  125 mcg Oral Once per day on Mon Tue Wed  Thu Fri Sat  . [START ON 01/15/2020] levothyroxine  187.5 mcg Oral Q Sun  . predniSONE  15 mg Oral QPM  . senna-docusate  1 tablet Oral QHS   Continuous Infusions: . sodium chloride    . 0.9 % NaCl with KCl 20 mEq / L 75 mL/hr at 01/10/20 0804  .  ceFAZolin (ANCEF) IV Stopped (01/10/20 0347)   PRN Meds:.Place/Maintain arterial line **AND** sodium chloride, fentaNYL (SUBLIMAZE) injection, levalbuterol, ondansetron (ZOFRAN) IV, oxyCODONE, traMADol  General appearance: alert, cooperative and no distress Neurologic: intact Heart: regular rate and rhythm, S1, S2 normal, no murmur, click, rub or gallop Lungs: diminished breath sounds bibasilar Abdomen: soft, non-tender; bowel sounds normal; no masses,  no organomegaly Extremities: extremities normal, atraumatic, no cyanosis or edema and Homans sign is negative, no sign of DVT Wound: no airt leak from chest tube   Lab Results: CBC: Recent Labs    01/10/20 0349  WBC 12.9*  HGB 10.2*  HCT 33.7*  PLT 272  BMET:  Recent Labs    01/10/20 0349  NA 139  K 3.9  CL 106  CO2 28  GLUCOSE 120*  BUN <5*  CREATININE 0.65  CALCIUM 8.1*    PT/INR: No results for input(s): LABPROT, INR in the last 72 hours.   Radiology DG Chest 2 View  Result Date: 01/09/2020 CLINICAL DATA:  Preop.  Interstitial lung disease EXAM: CHEST - 2 VIEW COMPARISON:  11/22/2019 FINDINGS: Interstitial coarsening at the bases with volume loss on the right. No edema, effusion, or pneumothorax. Normal heart size and mediastinal contours. IMPRESSION: Chronic lung disease without acute superimposed finding. Electronically Signed   By: Marnee Spring M.D.   On: 01/09/2020 06:48   DG Chest Port 1 View  Result Date: 01/09/2020 CLINICAL DATA:  Status post robotic assisted thorascopy, lung wedge biopsies of right lower, right middle and right upper lobe. EXAM: PORTABLE CHEST 1 VIEW COMPARISON:  Chest radiograph 01/09/2020 FINDINGS: Interval placement of a right IJ approach  central venous catheter with tip projecting in the region of the caudal SVC. There is now a right-sided apically oriented chest tube. A small right apical pneumothorax is questioned. Ill-defined opacities within the perihilar right lung likely reflecting atelectasis. Redemonstrated interstitial coarsening at the bilateral lung bases with volume loss on the right. Impression #2 These results will be called to the ordering clinician or representative by the Radiologist Assistant, and communication documented in the PACS or [employer]. IMPRESSION: 1. New right IJ approach central venous catheter and apically oriented right-sided chest tube. 2. A small right apical pneumothorax is questioned. Attention recommended on follow-up. 3. Ill-defined opacities within the perihilar right lung likely reflecting atelectasis. 4. Redemonstrated findings of chronic lung disease. Electronically Signed   By: Jackey Loge DO   On: 01/09/2020 12:46     Assessment/Plan: S/P Procedure(s) (LRB): VIDEO BRONCHOSCOPY (N/A) XI ROBOTIC ASSISTED THORASCOPY-LUNG WEDGE BIOPSIES OF RIGHT LOWER, RIGHT MIDDLE AND RIGHT UPPER LOBE. (Right) Mobilize d/c tubes/lines Follow up chest xray pending  Path pending   Delight Ovens MD 01/10/2020 8:27 AM

## 2020-01-10 NOTE — Plan of Care (Signed)

## 2020-01-11 ENCOUNTER — Inpatient Hospital Stay (HOSPITAL_COMMUNITY): Payer: 59

## 2020-01-11 LAB — COMPREHENSIVE METABOLIC PANEL
ALT: 18 U/L (ref 0–44)
AST: 17 U/L (ref 15–41)
Albumin: 2.8 g/dL — ABNORMAL LOW (ref 3.5–5.0)
Alkaline Phosphatase: 38 U/L (ref 38–126)
Anion gap: 10 (ref 5–15)
BUN: 7 mg/dL (ref 6–20)
CO2: 24 mmol/L (ref 22–32)
Calcium: 8.5 mg/dL — ABNORMAL LOW (ref 8.9–10.3)
Chloride: 107 mmol/L (ref 98–111)
Creatinine, Ser: 0.7 mg/dL (ref 0.44–1.00)
GFR calc Af Amer: >60 mL/min (ref >60)
GFR calc non Af Amer: >60 mL/min (ref >60)
Glucose, Bld: 121 mg/dL — ABNORMAL HIGH (ref 70–99)
Potassium: 4.2 mmol/L (ref 3.5–5.1)
Sodium: 141 mmol/L (ref 135–145)
Total Bilirubin: 0.7 mg/dL (ref 0.3–1.2)
Total Protein: 5.5 g/dL — ABNORMAL LOW (ref 6.5–8.1)

## 2020-01-11 LAB — CBC
HCT: 35.9 % — ABNORMAL LOW (ref 36.0–46.0)
Hemoglobin: 10.8 g/dL — ABNORMAL LOW (ref 12.0–15.0)
MCH: 25.5 pg — ABNORMAL LOW (ref 26.0–34.0)
MCHC: 30.1 g/dL (ref 30.0–36.0)
MCV: 84.9 fL (ref 80.0–100.0)
Platelets: 264 10*3/uL (ref 150–400)
RBC: 4.23 MIL/uL (ref 3.87–5.11)
RDW: 15.2 % (ref 11.5–15.5)
WBC: 9.8 10*3/uL (ref 4.0–10.5)
nRBC: 0 % (ref 0.0–0.2)

## 2020-01-11 MED ORDER — OXYCODONE HCL 5 MG PO TABS: 5.0000 mg | ORAL_TABLET | ORAL | 0 refills | Status: DC | PRN

## 2020-01-11 NOTE — Progress Notes (Signed)
Patient ID: [name], female   DOB: 07/11/1981, 39 y.o.   MRN: [id number] [facility] DAILY ICU PROGRESS NOTE                   [address]            [place] [address]          [phone]   2 Days Post-Op Procedure(s) (LRB): VIDEO BRONCHOSCOPY (N/A) XI ROBOTIC ASSISTED THORASCOPY-LUNG WEDGE BIOPSIES OF RIGHT LOWER, RIGHT MIDDLE AND RIGHT UPPER LOBE. (Right)  Total Length of Stay:  LOS: 2 days   Subjective: No complaints this am  Objective: Vital signs in last 24 hours: Temp:  [97.7 F (36.5 C)-98.3 F (36.8 C)] 97.7 F (36.5 C) (03/24 0323) Pulse Rate:  [65-96] 65 (03/24 0323) Cardiac Rhythm: Normal sinus rhythm (03/24 0305) Resp:  [15-20] 20 (03/24 0012) BP: (103-113)/(63-73) 104/73 (03/24 0323) SpO2:  [95 %-97 %] 97 % (03/24 0323)  Filed Weights   01/09/20 0622  Weight: 103.1 kg    Weight change:    Hemodynamic parameters for last 24 hours:    Intake/Output from previous day: 03/23 0701 - 03/24 0700 In: 700.3 [P.O.:240; I.V.:460.3] Out: 665 [Urine:475; Chest Tube:190]  Intake/Output this shift: No intake/output data recorded.  Current Meds: Scheduled Meds: . acetaminophen  1,000 mg Oral Q6H   Or  . acetaminophen (TYLENOL) oral liquid 160 mg/5 mL  1,000 mg Oral Q6H  . bisacodyl  10 mg Oral Daily  . Chlorhexidine Gluconate Cloth  6 each Topical Daily  . enoxaparin (LOVENOX) injection  40 mg Subcutaneous Q24H  . levothyroxine  125 mcg Oral Once per day on Mon Tue Wed Thu Fri Sat  . [START ON 01/15/2020] levothyroxine  187.5 mcg Oral Q Sun  . predniSONE  15 mg Oral QPM  . senna-docusate  1 tablet Oral QHS   Continuous Infusions: . sodium chloride    . 0.9 % NaCl with KCl 20 mEq / L 10 mL/hr at 01/11/20 0400   PRN Meds:.Place/Maintain arterial line **AND** sodium chloride, fentaNYL (SUBLIMAZE) injection, levalbuterol, ondansetron (ZOFRAN) IV, oxyCODONE, traMADol  General appearance: alert, cooperative and no distress Neurologic:  intact Heart: regular rate and rhythm, S1, S2 normal, no murmur, click, rub or gallop Lungs: diminished breath sounds bibasilar Abdomen: soft, non-tender; bowel sounds normal; no masses,  no organomegaly Extremities: extremities normal, atraumatic, no cyanosis or edema and Homans sign is negative, no sign of DVT Wound: no air leak from chest tube   Lab Results: CBC: Recent Labs    01/10/20 0349 01/11/20 0242  WBC 12.9* 9.8  HGB 10.2* 10.8*  HCT 33.7* 35.9*  PLT 272 264   BMET:  Recent Labs    01/10/20 0349 01/11/20 0242  NA 139 141  K 3.9 4.2  CL 106 107  CO2 28 24  GLUCOSE 120* 121*  BUN <5* 7  CREATININE 0.65 0.70  CALCIUM 8.1* 8.5*    CMET: Lab Results  Component Value Date   WBC 9.8 01/11/2020   HGB 10.8 (L) 01/11/2020   HCT 35.9 (L) 01/11/2020   PLT 264 01/11/2020   GLUCOSE 121 (H) 01/11/2020   CHOL 148 04/28/2019   TRIG 149.0 04/28/2019   HDL 42.50 04/28/2019   LDLCALC 76 04/28/2019   ALT 18 01/11/2020   AST 17 01/11/2020   NA 141 01/11/2020   K 4.2 01/11/2020   CL 107 01/11/2020   CREATININE 0.70 01/11/2020   BUN 7 01/11/2020  CO2 24 01/11/2020   TSH 2.919 10/10/2019   INR 1.0 01/05/2020      PT/INR: No results for input(s): LABPROT, INR in the last 72 hours. Radiology: [facility] Chest Port 1 View  Result Date: 01/10/2020 CLINICAL DATA:  Shortness of breath, pneumothorax, chest tube present EXAM: PORTABLE CHEST 1 VIEW COMPARISON:  01/09/2020 FINDINGS: Small right apical pneumothorax. Indwelling right chest tube. Suspected small right basilar pleural fluid component. Right lower lobe opacity, likely atelectasis. Suspected trace left pleural effusion. The heart is normal in size. Right IJ venous catheter terminates the cavoatrial junction. IMPRESSION: Small right apical pneumothorax with indwelling right chest tube. Suspected small pleural effusions.  Right basilar atelectasis. Electronically Signed   By: Julian Hy M.D.   On: 01/10/2020 09:01      Assessment/Plan: S/P Procedure(s) (LRB): VIDEO BRONCHOSCOPY (N/A) XI ROBOTIC ASSISTED THORASCOPY-LUNG WEDGE BIOPSIES OF RIGHT LOWER, RIGHT MIDDLE AND RIGHT UPPER LOBE. (Right) Mobilize d/c tubes/lines D/c chest tube today- follow up pa and lat chest xray Poss home later today Final path still pending Cultures negative so far     Grace Isaac 01/11/2020 7:44 AM

## 2020-01-11 NOTE — Plan of Care (Signed)

## 2020-01-11 NOTE — Progress Notes (Signed)
      [address]       [place] [address]             [phone]      2 Days Post-Op Procedure(s) (LRB): VIDEO BRONCHOSCOPY (N/A) XI ROBOTIC ASSISTED THORASCOPY-LUNG WEDGE BIOPSIES OF RIGHT LOWER, RIGHT MIDDLE AND RIGHT UPPER LOBE. (Right)   Subjective:  No new complaints.  Feels pretty good.  Hopeful to go home today.  Objective: Vital signs in last 24 hours: Temp:  [97.7 F (36.5 C)-98.3 F (36.8 C)] 97.7 F (36.5 C) (03/24 0323) Pulse Rate:  [65-96] 65 (03/24 0323) Cardiac Rhythm: Normal sinus rhythm (03/24 0305) Resp:  [15-20] 20 (03/24 0012) BP: (103-113)/(63-73) 104/73 (03/24 0323) SpO2:  [95 %-97 %] 97 % (03/24 0323)  Intake/Output from previous day: 03/23 0701 - 03/24 0700 In: 700.3 [P.O.:240; I.V.:460.3] Out: 665 [Urine:475; Chest Tube:190]  General appearance: alert, cooperative and no distress Heart: regular rate and rhythm Lungs: clear to auscultation bilaterally Abdomen: soft, non-tender; bowel sounds normal; no masses,  no organomegaly Wound: clean and dry  Lab Results: Recent Labs    01/10/20 0349 01/11/20 0242  WBC 12.9* 9.8  HGB 10.2* 10.8*  HCT 33.7* 35.9*  PLT 272 264   BMET:  Recent Labs    01/10/20 0349 01/11/20 0242  NA 139 141  K 3.9 4.2  CL 106 107  CO2 28 24  GLUCOSE 120* 121*  BUN <5* 7  CREATININE 0.65 0.70  CALCIUM 8.1* 8.5*    PT/INR: No results for input(s): LABPROT, INR in the last 72 hours. ABG    Component Value Date/Time   PHART 7.453 (H) 01/05/2020 1400   HCO3 25.0 01/05/2020 1400   TCO2 26.3 12/07/2007 0540   ACIDBASEDEF 0.1 12/02/2007 1134   O2SAT 98.5 01/05/2020 1400   CBG (last 3)  No results for input(s): GLUCAP in the last 72 hours.  Assessment/Plan: S/P Procedure(s) (LRB): VIDEO BRONCHOSCOPY (N/A) XI ROBOTIC ASSISTED THORASCOPY-LUNG WEDGE BIOPSIES OF RIGHT LOWER, RIGHT MIDDLE AND RIGHT UPPER LOBE. (Right)  1. CV- hemodynamically stable 2. Pulm- CT w/o air leak, CXR with  improvement in previous apical pneumothorax... will d/c chest tube today 3. Dispo- patient stable, will d/c chest tube today, repeat CXR if stable and patient feels up to it will d/c home today   LOS: 2 days    Lowella Dandy, PA-C  01/11/2020

## 2020-01-11 NOTE — Progress Notes (Signed)
Chest tube removed without complication, patient tolerated well. Site is clean and dry.

## 2020-01-11 NOTE — Progress Notes (Signed)
Discharge orders given to patient, medication changes and also follow up appointments were given. IV removed without complication and was intact. Site clean dry and intact. Patient is discharging home with spouse.

## 2020-01-12 ENCOUNTER — Telehealth: Payer: Self-pay

## 2020-01-12 NOTE — Telephone Encounter (Signed)
Called lmom informing patient of appointment on 01/16/2020 asked patient to reschedule appointment to 01/19/2020 @ 11:00am w/[facility]. Clemencia Course

## 2020-01-14 LAB — AEROBIC/ANAEROBIC CULTURE W GRAM STAIN (SURGICAL/DEEP WOUND)

## 2020-01-16 ENCOUNTER — Ambulatory Visit: Payer: 59 | Admitting: Internal Medicine

## 2020-01-17 LAB — SURGICAL PATHOLOGY

## 2020-01-19 ENCOUNTER — Other Ambulatory Visit: Payer: Self-pay

## 2020-01-19 ENCOUNTER — Ambulatory Visit: Payer: 59 | Admitting: Internal Medicine

## 2020-01-19 ENCOUNTER — Telehealth: Payer: Self-pay

## 2020-01-19 ENCOUNTER — Encounter: Payer: Self-pay | Admitting: Internal Medicine

## 2020-01-19 VITALS — BP 122/68 | HR 97 | Temp 97.4°F | Resp 16 | Ht 62.0 in | Wt 225.0 lb

## 2020-01-19 DIAGNOSIS — J8489 Other specified interstitial pulmonary diseases: Secondary | ICD-10-CM

## 2020-01-19 DIAGNOSIS — J8281 Chronic eosinophilic pneumonia: Secondary | ICD-10-CM | POA: Diagnosis not present

## 2020-01-19 DIAGNOSIS — F1920 Other psychoactive substance dependence, uncomplicated: Secondary | ICD-10-CM | POA: Diagnosis not present

## 2020-01-19 NOTE — Patient Instructions (Signed)
Pulmonary Fibrosis  Pulmonary fibrosis is a type of lung disease that causes scarring. Over time, the scar tissue builds up in the air sacs of your lungs (alveoli). This makes it hard for you to breathe. Less oxygen can get into your blood. Scarring from pulmonary fibrosis gets worse over time. This damage is permanent and may lead to other serious health problems. What are the causes? There are many different causes of pulmonary fibrosis. Sometimes the cause is not known. This is called idiopathic pulmonary fibrosis. Other causes include:  Exposure to chemicals and substances found in agricultural, farm, construction, or factory work. These include mold, asbestos, silica, metal dusts, and toxic fumes.  Sarcoidosis. In this disease, areas of inflammatory cells (granulomas) form and most often affect the lungs.  Autoimmune diseases. These include diseases such as rheumatoid arthritis, systemic sclerosis, or connective tissue disease.  Taking certain medicines. These include drugs used in radiation therapy or used to treat seizures, heart problems, and some infections. What increases the risk? You are more likely to develop this condition if:  You have a family history of the disease.  You are older. The condition is more common in older adults.  You have a history of smoking.  You have a job that exposes you to certain chemicals.  You have gastroesophageal reflux disease (GERD). What are the signs or symptoms? Symptoms of this condition include:  Difficulty breathing that gets worse with activity.  Shortness of breath (dyspnea).  Dry, hacking cough.  Rapid, shallow breathing during exercise or while at rest.  Bluish skin and lips.  Loss of appetite.  Weakness.  Weight loss and fatigue.  Rounded and enlarged fingertips (clubbing). How is this diagnosed? This condition may be diagnosed based on:  Your symptoms and medical history.  A physical exam. You may also have  tests, including:  A test that involves looking inside your lungs with an instrument (bronchoscopy).  Imaging studies of your lungs and heart.  Tests to measure how well you are breathing (pulmonary function tests).  Blood tests.  Tests to see how well your lungs work while you are walking (pulmonary stress test).  A procedure to remove a lung tissue sample to look at it under a microscope (biopsy). How is this treated? There is no cure for pulmonary fibrosis. Treatment focuses on managing symptoms and preventing scarring from getting worse. This may include:  Medicines, such as: ? Steroids to prevent permanent lung changes. ? Medicines to suppress your body's defense system (immune system). ? Medicines to help with lung function by reducing inflammation or scarring.  Ongoing monitoring with X-rays and lab work.  Oxygen therapy.  Pulmonary rehabilitation.  Surgery. In some cases, a lung transplant is possible. Follow these instructions at home:     Medicines  Take over-the-counter and prescription medicines only as told by your health care provider.  Keep your vaccinations up to date as recommended by your health care provider. General instructions  Do not use any products that contain nicotine or tobacco, such as cigarettes and e-cigarettes. If you need help quitting, ask your health care provider.  Get regular exercise, but do not overexert yourself. Ask your health care provider to suggest some activities that are safe for you to do. ? If you have physical limitations, you may get exercise by walking, using a stationary bike, or doing chair exercises. ? Ask your health care provider about using oxygen while exercising.  If you are exposed to chemicals and substances at work,   make sure that you wear a mask or respirator at all times.  Join a pulmonary rehabilitation program or a support group for people with pulmonary fibrosis.  Eat small meals often so you do not  get too full. Overeating can make breathing trouble worse.  Maintain a healthy weight. Lose weight if you need to.  Do breathing exercises as directed by your health care provider.  Keep all follow-up visits as told by your health care provider. This is important. Contact a health care provider if you:  Have symptoms that do not get better with medicines.  Are not able to be as active as usual.  Have trouble taking a deep breath.  Have a fever or chills.  Have blue lips or skin.  Have clubbing of your fingers. Get help right away if you:  Have a sudden worsening of your symptoms.  Have chest pain.  Cough up mucus that is dark in color.  Have a lot of headaches.  Get very confused or sleepy. Summary  Pulmonary fibrosis is a type of lung disease that causes scar tissue to build up in the air sacs of your lungs (alveoli) over time. Less oxygen can get into your blood. This makes it hard for you to breathe.  Scarring from pulmonary fibrosis gets worse over time. This damage is permanent and may lead to other serious health problems.  You are more likely to develop this condition if you have a family history of the condition or a job that exposes you to certain chemicals.  There is no cure for pulmonary fibrosis. Treatment focuses on managing symptoms and preventing scarring from getting worse. This information is not intended to replace advice given to you by your health care provider. Make sure you discuss any questions you have with your health care provider. Document Revised: 11/11/2017 Document Reviewed: 11/11/2017 [employer] Patient Education  2020 [employer]  

## 2020-01-19 NOTE — Telephone Encounter (Signed)
Pt advised we put labs order in epic she can call go to any [facility]

## 2020-01-19 NOTE — Progress Notes (Signed)
[facility] [place], [place] [address]  Pulmonary Sleep Medicine   Office Visit Note  Patient Name: [name] DOB: September 25, 1981 MRN [id number]  Date of Service: 01/19/2020  Complaints/HPI: ILD patient is here for follow-up after surgical biopsy was done at [facility].  The findings of the surgical biopsy revealed presence of nonspecific interstitial pneumonitis.  This is obviously a different diagnosis from her prior diagnosis of eosinophilic pneumonia.  She has been on steroids now for several months and has not shown any response to the steroids.  I spoke with her at length about the options available for treatment at this point and suggested that we try Imuran and also consider mycophenolate.  I have also suggested that we get her evaluated by [facility] since she is quite young and I would like her plugged into the system at [facility] in case there is any issues down the road such as transplant evaluation etc.  ROS  General: (-) fever, (-) chills, (-) night sweats, (-) weakness Skin: (-) rashes, (-) itching,. Eyes: (-) visual changes, (-) redness, (-) itching. Nose and Sinuses: (-) nasal stuffiness or itchiness, (-) postnasal drip, (-) nosebleeds, (-) sinus trouble. Mouth and Throat: (-) sore throat, (-) hoarseness. Neck: (-) swollen glands, (-) enlarged thyroid, (-) neck pain. Respiratory: - cough, (-) bloody sputum, + shortness of breath, - wheezing. Cardiovascular: - ankle swelling, (-) chest pain. Lymphatic: (-) lymph node enlargement. Neurologic: (-) numbness, (-) tingling. Psychiatric: (-) anxiety, (-) depression   Current Medication: Outpatient Encounter Medications as of 01/19/2020  Medication Sig Note  . acetaminophen (TYLENOL) 500 MG tablet Take 2 tablets (1,000 mg total) by mouth every 6 (six) hours as needed.   Marland Kitchen albuterol (PROVENTIL HFA;VENTOLIN HFA) 108 (90 Base) MCG/ACT inhaler Inhale 2 puffs into the lungs every 4 (four) hours as  needed for wheezing or shortness of breath.   . levothyroxine (SYNTHROID) 125 MCG tablet TAKE 1 TABLET BY MOUTH ONCE DAILY EXCEPT TAKE 1 AND 1/2 TABLETS ON SUNDAYS (Patient taking differently: Take 125-187.5 mcg by mouth See admin instructions. Take 125 mcg daily except take 187.5 mcg on Sundays)   . oxyCODONE (OXY IR/ROXICODONE) 5 MG immediate release tablet Take 1 tablet (5 mg total) by mouth every 4 (four) hours as needed for severe pain.   . predniSONE (DELTASONE) 5 MG tablet Take as directed (Patient taking differently: Take 15 mg by mouth every evening. )   . [DISCONTINUED] azithromycin (ZITHROMAX) 250 MG tablet Take 1 tab po daily (Patient not taking: Reported on 01/19/2020) 12/28/2019: Unknown finish date, been taking for over 30 days  . [DISCONTINUED] predniSONE (DELTASONE) 20 MG tablet TAKE 1 TABLET(20 MG) BY MOUTH DAILY WITH BREAKFAST (Patient not taking: Reported on 01/19/2020)   . [DISCONTINUED] voriconazole (VFEND) 200 MG tablet Take 1 tablet (200 mg total) by mouth 2 (two) times daily. (Patient not taking: Reported on 01/19/2020)    No facility-administered encounter medications on file as of 01/19/2020.    Surgical History: Past Surgical History:  Procedure Laterality Date  . CESAREAN SECTION  2007  . CHOLECYSTECTOMY N/A 10/27/2016   Procedure: LAPAROSCOPIC CHOLECYSTECTOMY;  Surgeon: Jules Husbands, MD;  Location: [facility] ORS;  Service: General;  Laterality: N/A;  . DIAGNOSTIC LAPAROSCOPY  2018   X2  . FLEXIBLE BRONCHOSCOPY N/A 05/03/2019   Procedure: FLEXIBLE BRONCHOSCOPY;  Surgeon: Allyne Gee, MD;  Location: [facility] ORS;  Service: Pulmonary;  Laterality: N/A;  . LUNG BIOPSY Right 2008   partial  right lung resection.  was treated with steroids x 1 year  . TONSILLECTOMY AND ADENOIDECTOMY    . VIDEO BRONCHOSCOPY N/A 01/09/2020   Procedure: VIDEO BRONCHOSCOPY;  Surgeon: Grace Isaac, MD;  Location: [facility];  Service: Thoracic;  Laterality: N/A;    Medical History: Past Medical History:   Diagnosis Date  . Anemia    Iron Deficiency in the past  . Asthma    in childhood  . Bowen's disease   . Complication of anesthesia    HARD TO WAKE UP SOMETIIMES  . Dyspnea 04/2019   sob and coughing has increased of late  . Endometriosis    dx'd in high school  . Eosinophilic pneumonia ~96/7893   per Dr. Clayborn Bigness s/p VATS and 1 year of prednisone  . GERD (gastroesophageal reflux disease)    TUMS PRN  . Headache    MIGRAINES without aura,seldom  . History of abnormal Pap smear   . History of chicken pox   . Hypothyroidism    dx'd 2011  . Pneumonia 2009/ 8101   EOSINOPHILIC PNEUMONIA  . PONV (postoperative nausea and vomiting)    x1  . Seizures ([place])    childhood-previously on Tegretol-been off meds for years with no seizures  . Urinary tract bacterial infections     Family History: Family History  Problem Relation Age of Onset  . Mental illness Mother        bipolar  . Cancer Father        h/o throat cancer- treated  . Heart disease Maternal Grandfather        at ate >60  . Alcohol abuse Maternal Grandfather   . Stomach cancer Neg Hx   . Rectal cancer Neg Hx   . Esophageal cancer Neg Hx   . Colon cancer Neg Hx   . Breast cancer Neg Hx     Social History: Social History   Socioeconomic History  . Marital status: Married    Spouse name: cory  . Number of children: 2  . Years of education: Not on file  . Highest education level: Not on file  Occupational History  . Occupation: Passenger transport manager    Comment: currently working  Tobacco Use  . Smoking status: Former Smoker    Packs/day: 0.50    Years: 10.00    Pack years: 5.00    Types: Cigarettes    Quit date: 10/21/2004    Years since quitting: 15.2  . Smokeless tobacco: Never Used  Substance and Sexual Activity  . Alcohol use: Yes    Comment: rare  . Drug use: No  . Sexual activity: Yes    Partners: Male    Birth control/protection: Condom  Other Topics Concern  . Not on file  Social History  Narrative   Caffeine use:  Yes   Regular exercise:  Yes   2 pregnancies = 2 live births, 1 son and 1 daughter   From [place], in [place] since 2006   Social Determinants of Health   Financial Resource Strain:   . Difficulty of Paying Living Expenses:   Food Insecurity:   . Worried About Charity fundraiser in the Last Year:   . Arboriculturist in the Last Year:   Transportation Needs:   . Film/video editor (Medical):   Marland Kitchen Lack of Transportation (Non-Medical):   Physical Activity:   . Days of Exercise per Week:   . Minutes of Exercise per Session:   Stress:   .  Feeling of Stress :   Social Connections:   . Frequency of Communication with Friends and Family:   . Frequency of Social Gatherings with Friends and Family:   . Attends Religious Services:   . Active Member of Clubs or Organizations:   . Attends Archivist Meetings:   Marland Kitchen Marital Status:   Intimate Partner Violence:   . Fear of Current or Ex-Partner:   . Emotionally Abused:   Marland Kitchen Physically Abused:   . Sexually Abused:     Vital Signs: Blood pressure 122/68, pulse 97, temperature (!) 97.4 F (36.3 C), resp. rate 16, height _0  (1.575 m), weight 225 lb (102.1 kg), last menstrual period 01/06/2020, SpO2 99 %.  Examination: General Appearance: The patient is well-developed, well-nourished, and in no distress. Skin: Gross inspection of skin unremarkable. Head: normocephalic, no gross deformities. Eyes: no gross deformities noted. ENT: ears appear grossly normal no exudates. Neck: Supple. No thyromegaly. No LAD. Respiratory: Few crackles are noted at the bases. Cardiovascular: Normal S1 and S2 without murmur or rub. Extremities: No cyanosis. pulses are equal. Neurologic: Alert and oriented. No involuntary movements.  LABS: Recent Results (from the past 2160 hour(s))  SARS CORONAVIRUS 2 (TAT 6-24 HRS) Nasopharyngeal Nasopharyngeal Swab     Status: None   Collection Time: 01/05/20 11:43 AM   Specimen:  Nasopharyngeal Swab  Result Value Ref Range   SARS Coronavirus 2 NEGATIVE NEGATIVE    Comment: (NOTE) SARS-CoV-2 target nucleic acids are NOT DETECTED. The SARS-CoV-2 RNA is generally detectable in upper and lower respiratory specimens during the acute phase of infection. Negative results do not preclude SARS-CoV-2 infection, do not rule out co-infections with other pathogens, and should not be used as the sole basis for treatment or other patient management decisions. Negative results must be combined with clinical observations, patient history, and epidemiological information. The expected result is Negative. Fact Sheet for Patients: SugarRoll.be Fact Sheet for Healthcare Providers: https://www.woods-mathews.com/ This test is not yet approved or cleared by the [place] FDA and  has been authorized for detection and/or diagnosis of SARS-CoV-2 by FDA under an Emergency Use Authorization (EUA). This EUA will remain  in effect (meaning this test can be used) for the duration of the COVID-19 declaration under Section 56 4(b)(1) of the Act, 21 U.S.C. section 360bbb-3(b)(1), unless the authorization is terminated or revoked sooner. Performed at [facility], [place] [address], [place], [place] [address]   APTT     Status: None   Collection Time: 01/05/20  1:31 PM  Result Value Ref Range   aPTT 30 24 - 36 seconds    Comment: Performed at [facility] [address] [place], [place] [address]  CBC     Status: Abnormal   Collection Time: 01/05/20  1:31 PM  Result Value Ref Range   WBC 11.9 (H) 4.0 - 10.5 K/uL   RBC 4.48 3.87 - 5.11 MIL/uL   Hemoglobin 11.5 (L) 12.0 - 15.0 g/dL   HCT 37.8 36.0 - 46.0 %   MCV 84.4 80.0 - 100.0 fL   MCH 25.7 (L) 26.0 - 34.0 pg   MCHC 30.4 30.0 - 36.0 g/dL   RDW 14.9 11.5 - 15.5 %   Platelets 298 150 - 400 K/uL   nRBC 0.0 0.0 - 0.2 %    Comment: Performed at [facility], [place] [address] [place], [place] [address]  Comprehensive metabolic panel     Status: Abnormal   Collection  Time: 01/05/20  1:31 PM  Result Value Ref Range   Sodium 140 135 - 145 mmol/L   Potassium 3.8 3.5 - 5.1 mmol/L   Chloride 108 98 - 111 mmol/L   CO2 21 (L) 22 - 32 mmol/L   Glucose, Bld 99 70 - 99 mg/dL    Comment: Glucose reference range applies only to samples taken after fasting for at least 8 hours.   BUN 8 6 - 20 mg/dL   Creatinine, Ser 0.71 0.44 - 1.00 mg/dL   Calcium 8.8 (L) 8.9 - 10.3 mg/dL   Total Protein 6.2 (L) 6.5 - 8.1 g/dL   Albumin 3.4 (L) 3.5 - 5.0 g/dL   AST 25 15 - 41 U/L   ALT 18 0 - 44 U/L   Alkaline Phosphatase 43 38 - 126 U/L   Total Bilirubin 0.9 0.3 - 1.2 mg/dL   GFR calc non Af Amer >60 >60 mL/min   GFR calc Af Amer >60 >60 mL/min   Anion gap 11 5 - 15    Comment: Performed at [facility] [address], [place], [place] [address]  Protime-INR     Status: None   Collection Time: 01/05/20  1:31 PM  Result Value Ref Range   Prothrombin Time 13.1 11.4 - 15.2 seconds   INR 1.0 0.8 - 1.2    Comment: (NOTE) INR goal varies based on device and disease states. Performed at [facility], [place] [address] [place], [place] [address]   Type and screen     Status: None   Collection Time: 01/05/20  1:54 PM  Result Value Ref Range   ABO/RH(D) O NEG    Antibody Screen NEG    Sample Expiration 01/19/2020,2359    Extend sample reason      NO TRANSFUSIONS OR PREGNANCY IN THE PAST 3 MONTHS Performed at [facility], [place] [address], [place], [place] [address]   Surgical pcr screen     Status: None   Collection Time: 01/05/20  1:55 PM   Specimen: Nasal Mucosa; Nasal Swab  Result Value Ref Range   MRSA, PCR NEGATIVE NEGATIVE   Staphylococcus aureus NEGATIVE NEGATIVE    Comment: (NOTE) The Xpert SA Assay (FDA approved for NASAL specimens in patients 106 years of age and older), is one component of a comprehensive surveillance program. It is not intended to  diagnose infection nor to guide or monitor treatment. Performed at [facility], [place] [address], [place], [place] [address]   Urinalysis, Routine w reflex microscopic     Status: Abnormal   Collection Time: 01/05/20  1:55 PM  Result Value Ref Range   Color, Urine YELLOW YELLOW   APPearance CLOUDY (A) CLEAR   Specific Gravity, Urine 1.024 1.005 - 1.030   pH 5.0 5.0 - 8.0   Glucose, UA NEGATIVE NEGATIVE mg/dL   Hgb urine dipstick NEGATIVE NEGATIVE   Bilirubin Urine NEGATIVE NEGATIVE   Ketones, ur NEGATIVE NEGATIVE mg/dL   Protein, ur NEGATIVE NEGATIVE mg/dL   Nitrite NEGATIVE NEGATIVE   Leukocytes,Ua MODERATE (A) NEGATIVE   RBC / HPF 0-5 0 - 5 RBC/hpf   WBC, UA 21-50 0 - 5 WBC/hpf   Bacteria, UA RARE (A) NONE SEEN   Squamous Epithelial / LPF 21-50 0 - 5   Mucus PRESENT     Comment: Performed at [facility], [address], [place], [place] [address]  Blood gas, arterial on room air  Status: Abnormal   Collection Time: 01/05/20  2:00 PM  Result Value Ref Range   FIO2 21.00    pH, Arterial 7.453 (H) 7.350 - 7.450   pCO2 arterial 36.2 32.0 - 48.0 mmHg   pO2, Arterial 109 (H) 83.0 - 108.0 mmHg   Bicarbonate 25.0 20.0 - 28.0 mmol/L   Acid-Base Excess 1.4 0.0 - 2.0 mmol/L   O2 Saturation 98.5 %   Patient temperature 37.0    Collection site LEFT BRACHIAL    Drawn by [id number]    Sample type ARTERIAL DRAW    Allens test (pass/fail) BRACHIAL ARTERY (A) PASS    Comment: Performed at [facility], [place] [address], [place],  [address]  Pregnancy, urine POC     Status: None   Collection Time: 01/09/20  6:46 AM  Result Value Ref Range   Preg Test, Ur NEGATIVE NEGATIVE    Comment:        THE SENSITIVITY OF THIS METHODOLOGY IS >24 mIU/mL   Surgical pathology     Status: None   Collection Time: 01/09/20  8:48 AM  Result Value Ref Range   SURGICAL PATHOLOGY      SURGICAL PATHOLOGY CASE: [id number] PATIENT: [facility]  Report     Clinical History: ILD (cm)     FINAL MICROSCOPIC DIAGNOSIS:  A. LUNG, RIGHT MIDDLE LOBE, WEDGE BIOPSY: - Chronic interstitial fibroinflammatory process (see comment)  B. LUNG, RIGHT UPPER LOBE, WEDGE BIOPSY: - Lung parenchyma with minimal changes  C. LUNG, RIGHT LOWER LOBE, WEDGE BIOPSY: - Relatively mild interstitial fibroinflammatory process, mostly involving subpleural lung parenchyma. See comment   COMMENT:  A and C.   The wedge biopsy shows mostly diffuse interstitial fibrosis and patchy chronic inflammation with few lymphoid aggregates.  There is significant peribronchial metaplasia with focal microscopic honeycombing. Very rare fibroblast foci are present. Focal areas with organizing granulation-like tissue with squamoid metaplasia is seen. Clear cut architectural distortion is not identified.  Some of the interstitial fibrosis appears ropy and hyalin e - possibility of smoking-related interstitial fibrosis (SRIF) was considered but the degree of interstitial inflammation is significantly more that seen in SRIF. Patient's clinical history of eosinophilic pneumonia a few years ago was also noted. Only rare eosinophils are seen in the current biopsy but please note that treatment with steroids can lead to a dramatic decrease in the number of eosinophils in a short time. Granulomas are not identified.  Differential diagnosis mainly included usual interstitial pneumonia (UIP) and nonspecific interstitial pneumonia (NSIP). In absence of clear architectural distortion or heterogeneity of the fibrotic process, the findings appear to be most consistent with NSIP-like pattern but early/ evolving UIP cannot be entirely ruled out. NSIP may be seen secondary to collagen vascular disease, autoimmune diseases, drug reactions, chronic hypersensitivity pneumonia, and slowly resolving previous acute lung injury. Also, there seem to b e overlapping histologic  features suggestive of resolving acute lung injury. Presence of lymphoid aggregates may suggest collagen vascular disease. Clinical and radiographic correlation is suggested.     GROSS DESCRIPTION:  A: The specimen is received fresh and consists of a 3.0 x 1.5 x 0.7 cm piece of tan-pink lung parenchyma with stapled resection margin.  The pleural surface is smooth and hyperemic.  The outer edge of the specimen displays embedded staples.  Representative sections are submitted in 1 cassette.  B: The specimen is received fresh and consists of a wedge of lung parenchyma, measuring 3.1 x 1.7 x 1.2 cm.  The pleural surface is pink-purple, hyperemic, with stapled resection margin.  Sectioning reveals pink-red slightly congested parenchyma.  Representative sections are submitted in 2 cassettes.  C: The specimen is received fresh and consists of a 3.1 x 1.1 x 0.7 cm wedge of lung parenchyma with stapled resection margin.  The pleural surface is ta n-pink, hyperemic, and slightly disrupted.  Sectioning reveals pink-red parenchyma.  Representative sections are submitted in 2 cassettes. Craig Staggers 01/17/2020)    Final Diagnosis performed by Jaquita Folds, MD.   Electronically signed 01/17/2020 Technical component performed at [facility], [place] [address] [place], [place] [address].  Professional component performed at [facility], [place] [address], [place], [place] [address].  Immunohistochemistry Technical component (if applicable) was performed at [facility]. [address] [place], [place], [place] [address].   IMMUNOHISTOCHEMISTRY DISCLAIMER (if applicable): Some of these immunohistochemical stains may have been developed and the performance characteristics determine by [facility]. Some may not have been cleared or approved by the U.S. Food and Drug Administration. The FDA has determined that such clearance or  approval is not necessary. This test  is used for clinical purposes. It should not be regarded as investigational or for research. This laboratory is certified under the [place] (CLIA-88) as qualified to perform high complexity clinical laboratory testing.  The controls stained appropriately.   Fungus Culture With Stain     Status: None (Preliminary result)   Collection Time: 01/09/20 10:21 AM   Specimen: PATH Respiratory resection; Tissue  Result Value Ref Range   Fungus Stain Final report     Comment: (NOTE) Performed At: [facility] [place], [place] [id number] Rush Farmer MD TO:[id number]    Fungus (Mycology) Culture PENDING    Fungal Source LUNG     Comment: BIOPSY RIGHT UPPER   Aerobic/Anaerobic Culture (surgical/deep wound)     Status: None   Collection Time: 01/09/20 10:21 AM   Specimen: PATH Respiratory resection; Tissue  Result Value Ref Range   Specimen Description LUNG BIOPSY    Special Requests RIGHT UPPER    Gram Stain      FEW WBC PRESENT, PREDOMINANTLY MONONUCLEAR NO ORGANISMS SEEN    Culture      RARE STREPTOCOCCUS MITIS/ORALIS NO ANAEROBES ISOLATED Performed at [facility], [address] [place],  [address]    Report Status 01/14/2020 FINAL   Acid Fast Smear (AFB)     Status: None   Collection Time: 01/09/20 10:21 AM   Specimen: PATH Respiratory resection; Tissue  Result Value Ref Range   AFB Specimen Processing Concentration    Acid Fast Smear Negative     Comment: (NOTE) Performed At: [facility] [place], [place] [id number] Rush Farmer MD [id number]    Source (AFB) LUNG     Comment: BIOPSY RIGHT UPPER   Fungus Culture Result     Status: None   Collection Time: 01/09/20 10:21 AM  Result Value Ref Range   Result 1 Comment     Comment: (NOTE) KOH/Calcofluor preparation:  no fungus observed. Performed At: [facility] [place], [place] [id number] Rush Farmer MD AS:[id number]   CBC     Status: Abnormal   Collection Time: 01/10/20  3:49 AM  Result Value Ref Range   WBC 12.9 (H) 4.0 - 10.5 K/uL   RBC 4.01 3.87 - 5.11 MIL/uL   Hemoglobin 10.2 (L) 12.0 -  15.0 g/dL   HCT 33.7 (L) 36.0 - 46.0 %   MCV 84.0 80.0 - 100.0 fL   MCH 25.4 (L) 26.0 - 34.0 pg   MCHC 30.3 30.0 - 36.0 g/dL   RDW 14.8 11.5 - 15.5 %   Platelets 272 150 - 400 K/uL   nRBC 0.0 0.0 - 0.2 %    Comment: Performed at [facility] [address] [place], [place] [address]  Basic metabolic panel     Status: Abnormal   Collection Time: 01/10/20  3:49 AM  Result Value Ref Range   Sodium 139 135 - 145 mmol/L   Potassium 3.9 3.5 - 5.1 mmol/L   Chloride 106 98 - 111 mmol/L   CO2 28 22 - 32 mmol/L   Glucose, Bld 120 (H) 70 - 99 mg/dL    Comment: Glucose reference range applies only to samples taken after fasting for at least 8 hours.   BUN <5 (L) 6 - 20 mg/dL   Creatinine, Ser 0.65 0.44 - 1.00 mg/dL   Calcium 8.1 (L) 8.9 - 10.3 mg/dL   GFR calc non Af Amer >60 >60 mL/min   GFR calc Af Amer >60 >60 mL/min   Anion gap 5 5 - 15    Comment: Performed at [facility] [address] [place], [place] [address]  CBC     Status: Abnormal   Collection Time: 01/11/20  2:42 AM  Result Value Ref Range   WBC 9.8 4.0 - 10.5 K/uL   RBC 4.23 3.87 - 5.11 MIL/uL   Hemoglobin 10.8 (L) 12.0 - 15.0 g/dL   HCT 35.9 (L) 36.0 - 46.0 %   MCV 84.9 80.0 - 100.0 fL   MCH 25.5 (L) 26.0 - 34.0 pg   MCHC 30.1 30.0 - 36.0 g/dL   RDW 15.2 11.5 - 15.5 %   Platelets 264 150 - 400 K/uL   nRBC 0.0 0.0 - 0.2 %    Comment: Performed at [facility], [place] [address], [place], [place] [address]  Comprehensive metabolic panel     Status: Abnormal   Collection Time: 01/11/20  2:42 AM  Result Value Ref Range   Sodium 141 135 - 145 mmol/L   Potassium 4.2 3.5 - 5.1 mmol/L   Chloride 107 98 - 111 mmol/L   CO2 24 22 - 32 mmol/L    Glucose, Bld 121 (H) 70 - 99 mg/dL    Comment: Glucose reference range applies only to samples taken after fasting for at least 8 hours.   BUN 7 6 - 20 mg/dL   Creatinine, Ser 0.70 0.44 - 1.00 mg/dL   Calcium 8.5 (L) 8.9 - 10.3 mg/dL   Total Protein 5.5 (L) 6.5 - 8.1 g/dL   Albumin 2.8 (L) 3.5 - 5.0 g/dL   AST 17 15 - 41 U/L   ALT 18 0 - 44 U/L   Alkaline Phosphatase 38 38 - 126 U/L   Total Bilirubin 0.7 0.3 - 1.2 mg/dL   GFR calc non Af Amer >60 >60 mL/min   GFR calc Af Amer >60 >60 mL/min   Anion gap 10 5 - 15    Comment: Performed at [facility], [place] [address], [place], [place] [address]    Radiology: No results found.  No results found.  DG Chest 2 View  Result Date: 01/11/2020 CLINICAL DATA:  Post removal of right chest tube. EXAM: CHEST - 2 VIEW COMPARISON:  Radiograph earlier today.  Chest CT 12/30/2019 FINDINGS: Right chest tube has been removed. Minimal air in the right chest wall soft tissues. Previous small right apical pneumothorax is unchanged. Unchanged volume loss and scarring in the right lung with multiple chain sutures. Heart is normal in size with normal mediastinal contours. Left lower lobe bronchiectasis was better demonstrated on prior CT. Left lung otherwise clear. IMPRESSION: 1. Removal of right chest tube. Unchanged small right apical pneumothorax. 2. Stable volume loss and postsurgical change in the right lung. Electronically Signed   By: Keith Rake M.D.   On: 01/11/2020 11:00   DG Chest 2 View  Result Date: 01/09/2020 CLINICAL DATA:  Preop.  Interstitial lung disease EXAM: CHEST - 2 VIEW COMPARISON:  11/22/2019 FINDINGS: Interstitial coarsening at the bases with volume loss on the right. No edema, effusion, or pneumothorax. Normal heart size and mediastinal contours. IMPRESSION: Chronic lung disease without acute superimposed finding. Electronically Signed   By: Monte Fantasia M.D.   On: 01/09/2020 06:48   CT Chest High Resolution  Result  Date: 12/30/2019 CLINICAL DATA:  Reported history of chronic eosinophilic pneumonia with partial right lung resection in 2008, on prednisone therapy. Follow-up interstitial lung disease. Dyspnea on exertion, chronic. Remote smoking history. Reported influenza pneumonia in March 2020. EXAM: CT CHEST WITHOUT CONTRAST TECHNIQUE: Multidetector CT imaging of the chest was performed following the standard protocol without intravenous contrast. High resolution imaging of the lungs, as well as inspiratory and expiratory imaging, was performed. COMPARISON:  09/27/2019 high-resolution chest CT. 11/22/2019 chest radiograph. FINDINGS: Cardiovascular: Normal heart size. No significant pericardial effusion/thickening. Great vessels are normal in course and caliber. Mediastinum/Nodes: No discrete thyroid nodules. Unremarkable esophagus. No pathologically enlarged axillary, mediastinal or hilar lymph nodes, noting limited sensitivity for the detection of hilar adenopathy on this noncontrast study. Lungs/Pleura: No pneumothorax. No pleural effusion. Wedge resection suture lines again noted at the peripheral right lung base. No acute consolidative airspace disease, lung masses or significant pulmonary nodules. No significant air trapping or evidence of tracheobronchomalacia on the expiration sequence. There is patchy peribronchovascular and peripheral ground-glass opacity and reticulation throughout both lungs with a strong basilar predominance with associated moderate traction bronchiectasis, volume loss and architectural distortion. No frank honeycombing. Findings have not appreciably changed in the short interval since 09/27/2019 CT. Findings have clearly progressed compared to more remote chest CT study of 09/27/2012. Upper abdomen: Cholecystectomy. Musculoskeletal:  No aggressive appearing focal osseous lesions. IMPRESSION: Stable chest CT in the short interval since 09/27/2019. Spectrum of findings compatible with basilar  predominant fibrotic interstitial lung disease with moderate traction bronchiectasis and no honeycombing. Findings have clearly progressed compared to more remote chest CT study of 09/27/2012. Findings are categorized as probable UIP per consensus guidelines: Diagnosis of Idiopathic Pulmonary Fibrosis: An Official ATS/ERS/JRS/ALAT Clinical Practice Guideline. [place], Iss 5, [phone], Jun 20 2017. Electronically Signed   By: Ilona Sorrel M.D.   On: 12/30/2019 14:37   DG Chest Port 1 View  Result Date: 01/11/2020 CLINICAL DATA:  Chest tube EXAM: PORTABLE CHEST 1 VIEW COMPARISON:  Chest radiograph from one day prior. FINDINGS: Stable right apical chest tube position. Stable cardiomediastinal silhouette with normal heart size. Low lung volumes. Small right apical pneumothorax, less than 5%, decreased. Surgical sutures overlie the apical and mid to lower right lung. No left pneumothorax. No significant pleural effusions. No pulmonary edema. Stable bibasilar atelectasis. IMPRESSION: 1. Small right apical pneumothorax, less than 5%, decreased. Stable right apical chest tube position.  2. Low lung volumes with stable bibasilar atelectasis. Electronically Signed   By: Ilona Sorrel M.D.   On: 01/11/2020 09:30   DG Chest Port 1 View  Result Date: 01/10/2020 CLINICAL DATA:  Shortness of breath, pneumothorax, chest tube present EXAM: PORTABLE CHEST 1 VIEW COMPARISON:  01/09/2020 FINDINGS: Small right apical pneumothorax. Indwelling right chest tube. Suspected small right basilar pleural fluid component. Right lower lobe opacity, likely atelectasis. Suspected trace left pleural effusion. The heart is normal in size. Right IJ venous catheter terminates the cavoatrial junction. IMPRESSION: Small right apical pneumothorax with indwelling right chest tube. Suspected small pleural effusions.  Right basilar atelectasis. Electronically Signed   By: Julian Hy M.D.   On: 01/10/2020 09:01   DG  Chest Port 1 View  Result Date: 01/09/2020 CLINICAL DATA:  Status post robotic assisted thorascopy, lung wedge biopsies of right lower, right middle and right upper lobe. EXAM: PORTABLE CHEST 1 VIEW COMPARISON:  Chest radiograph 01/09/2020 FINDINGS: Interval placement of a right IJ approach central venous catheter with tip projecting in the region of the caudal SVC. There is now a right-sided apically oriented chest tube. A small right apical pneumothorax is questioned. Ill-defined opacities within the perihilar right lung likely reflecting atelectasis. Redemonstrated interstitial coarsening at the bilateral lung bases with volume loss on the right. Impression #2 These results will be called to the ordering clinician or representative by the Radiologist Assistant, and communication documented in the PACS or [employer]. IMPRESSION: 1. New right IJ approach central venous catheter and apically oriented right-sided chest tube. 2. A small right apical pneumothorax is questioned. Attention recommended on follow-up. 3. Ill-defined opacities within the perihilar right lung likely reflecting atelectasis. 4. Redemonstrated findings of chronic lung disease. Electronically Signed   By: Kellie Simmering DO   On: 01/09/2020 12:46      Assessment and Plan: Patient Active Problem List   Diagnosis Date Noted  . S/P Robotic Assisted Right side lung biopsy (wedge RUL, RML, RLL) 01/10/2020  . Interstitial lung disease ([place]) 01/09/2020  . Routine general medical examination at a health care facility 05/08/2019  . Lower respiratory infection 01/26/2019  . Lumbar radicular pain 12/21/2017  . Advance care planning 05/04/2017  . Calculus of gallbladder without cholecystitis without obstruction 08/05/2016  . Migraine without aura 04/18/2012  . Cough 02/06/2012  . Eosinophilic pneumonia 37/16/9678  . Hypothyroidism 02/07/2011    1. Nonspecific interstitial pneumonitis.  The findings on the surgical biopsies are  somewhat of a surprise as diagnosis is definitely different than she had in her previous diagnosis of eosinophilic pneumonia.  I would recommend at this point that we get a university evaluation with interstitial lung disease specialist.  In addition I am going to recommend that she be started on Imuran for now.  Medication such as mycophenolate can be considered after assessment by the [facility].  The patient understands that while she is on any of these medications she should not get pregnant.  She states that she understands this and the risks that are involved.  In addition I have ordered a sed rate rheumatoid factor ANA ANCA glomerular basement membrane antibodies and scleroderma diagnostic profile. 2. Steroid dependence I am going to start to wean her down on the steroids since she has not really responded to the medications.  I have given her a taper schedule to start. 3. History of eosinophilic pneumonia obviously at this point we have a different diagnosis now and we will give you her treatment  towards this diagnosis.  General Counseling: I have discussed the findings of the evaluation and examination with Anderson Malta.  I have also discussed any further diagnostic evaluation thatmay be needed or ordered today. Lonisha verbalizes understanding of the findings of todays visit. We also reviewed her medications today and discussed drug interactions and side effects including but not limited excessive drowsiness and altered mental states. We also discussed that there is always a risk not just to her but also people around her. she has been encouraged to call the office with any questions or concerns that should arise related to todays visit.  Orders Placed This Encounter  Procedures  . Sed Rate (ESR)  . Rheumatoid Factor  . ANA Direct w/Reflex if Positive  . ANCA Screen Reflex Titer(QUEST)  . Glomerular Membrane Antibodies  . Scleroderma Diagnostic Profile     Time spent: 40 minutes  I  have personally obtained a history, examined the patient, evaluated laboratory and imaging results, formulated the assessment and plan and placed orders.    Allyne Gee, MD [facility] Pulmonary and Critical Care Sleep medicine

## 2020-01-23 ENCOUNTER — Telehealth: Payer: Self-pay

## 2020-01-23 MED ORDER — AZATHIOPRINE 50 MG PO TABS: 25.0000 mg | ORAL_TABLET | Freq: Every day | ORAL | 3 refills | Status: DC

## 2020-01-23 NOTE — Telephone Encounter (Signed)
Pt advised as per DSK we send azaTHIOprine to phar during the course of that  med make sure not to get pregnant

## 2020-01-24 ENCOUNTER — Other Ambulatory Visit: Payer: Self-pay | Admitting: Cardiothoracic Surgery

## 2020-01-24 DIAGNOSIS — J849 Interstitial pulmonary disease, unspecified: Secondary | ICD-10-CM

## 2020-01-25 LAB — SCLERODERMA DIAGNOSTIC PROFILE
Anti Nuclear Antibody (ANA): POSITIVE — AB
Scleroderma (Scl-70) (ENA) Antibody, IgG: <0.2 AI (ref 0.0–0.9)

## 2020-01-25 LAB — SEDIMENTATION RATE: Sed Rate: 12 mm/hr (ref 0–32)

## 2020-01-25 LAB — RHEUMATOID FACTOR: Rheumatoid fact SerPl-aCnc: <10 IU/mL (ref 0.0–13.9)

## 2020-01-25 LAB — GLOMERULAR BASEMENT MEMBRANE ANTIBODIES: Antiglomerular BM Ab, Qn: 5 units (ref 0–20)

## 2020-01-26 ENCOUNTER — Other Ambulatory Visit: Payer: Self-pay

## 2020-01-26 ENCOUNTER — Ambulatory Visit (INDEPENDENT_AMBULATORY_CARE_PROVIDER_SITE_OTHER): Payer: Self-pay | Admitting: Cardiothoracic Surgery

## 2020-01-26 ENCOUNTER — Ambulatory Visit
Admission: RE | Admit: 2020-01-26 | Discharge: 2020-01-26 | Disposition: A | Discharge status: Home / Self Care | Payer: 59 | Source: Ambulatory Visit | Attending: Cardiothoracic Surgery | Admitting: Cardiothoracic Surgery

## 2020-01-26 VITALS — BP 119/81 | HR 80 | Temp 97.7°F | Resp 20 | Ht 62.0 in | Wt 227.0 lb

## 2020-01-26 DIAGNOSIS — Z09 Encounter for follow-up examination after completed treatment for conditions other than malignant neoplasm: Secondary | ICD-10-CM

## 2020-01-26 DIAGNOSIS — J849 Interstitial pulmonary disease, unspecified: Secondary | ICD-10-CM

## 2020-01-27 NOTE — Progress Notes (Signed)
[address]       [place] [address]             [place] Record #[id number] Date of Birth: 02-Jan-1981  Referring: [name], MD Primary Care: [name], MD Primary Cardiologist: No primary care provider on file.   Chief Complaint:   POST OP FOLLOW UP OPERATIVE REPORT DATE OF PROCEDURE:  01/09/2020 PREOPERATIVE DIAGNOSIS:  Question of interstitial lung disease. POSTOPERATIVE DIAGNOSIS:  Question of interstitial lung disease. SURGICAL PROCEDURE:  Bronchoscopy, right robotic-assisted wedge resection of right upper lobe, right middle lobe, and right lower lobe. Intercostal nerve block   History of Present Illness:     Patient doing well postoperatively.  She notes she has been back to pulmonary, is being referred to [facility].  Her steroids are being tapered and she is to start Imuran in the near future.     Past Medical History:  Diagnosis Date  . Anemia    Iron Deficiency in the past  . Asthma    in childhood  . Bowen's disease   . Complication of anesthesia    HARD TO WAKE UP SOMETIIMES  . Dyspnea 04/2019   sob and coughing has increased of late  . Endometriosis    dx'd in high school  . Eosinophilic pneumonia ~44/8185   per Dr. Clayborn Bigness s/p VATS and 1 year of prednisone  . GERD (gastroesophageal reflux disease)    TUMS PRN  . Headache    MIGRAINES without aura,seldom  . History of abnormal Pap smear   . History of chicken pox   . Hypothyroidism    dx'd 2011  . Pneumonia 2009/ 6314   EOSINOPHILIC PNEUMONIA  . PONV (postoperative nausea and vomiting)    x1  . Seizures ([place])    childhood-previously on Tegretol-been off meds for years with no seizures  . Urinary tract bacterial infections      Social History   Tobacco Use  Smoking Status Former Smoker  . Packs/day: 0.50  . Years: 10.00  . Pack years: 5.00  . Types: Cigarettes  . Quit date: 10/21/2004  .  Years since quitting: 15.2  Smokeless Tobacco Never Used    Social History   Substance and Sexual Activity  Alcohol Use Yes   Comment: rare     No Known Allergies  Current Outpatient Medications  Medication Sig Dispense Refill  . acetaminophen (TYLENOL) 500 MG tablet Take 2 tablets (1,000 mg total) by mouth every 6 (six) hours as needed. 30 tablet 0  . albuterol (PROVENTIL HFA;VENTOLIN HFA) 108 (90 Base) MCG/ACT inhaler Inhale 2 puffs into the lungs every 4 (four) hours as needed for wheezing or shortness of breath. 1 Inhaler 0  . levothyroxine (SYNTHROID) 125 MCG tablet TAKE 1 TABLET BY MOUTH ONCE DAILY EXCEPT TAKE 1 AND 1/2 TABLETS ON SUNDAYS (Patient taking differently: Take 125-187.5 mcg by mouth See admin instructions. Take 125 mcg daily except take 187.5 mcg on Sundays) 98 tablet 1  . oxyCODONE (OXY IR/ROXICODONE) 5 MG immediate release tablet Take 1 tablet (5 mg total) by mouth every 4 (four) hours as needed for severe pain. 30 tablet 0  . predniSONE (DELTASONE) 5 MG tablet Take as directed (Patient taking differently: Take 15 mg by mouth every evening. ) 120 tablet 4  . azaTHIOprine (IMURAN) 50 MG tablet Take 0.5 tablets (25 mg total) by mouth daily. (  Patient not taking: Reported on 01/26/2020) 60 tablet 3   No current facility-administered medications for this visit.       Physical Exam: BP 119/81 (BP Location: Right Arm, Patient Position: Sitting, Cuff Size: Normal)   Pulse 80   Temp 97.7 F (36.5 C) (Temporal)   Resp 20   Ht 5\' 2"  (1.575 m)   Wt 227 lb (103 kg)   LMP 01/06/2020 Comment: pregnancy test negative 01/09/20  SpO2 97% Comment: RA  BMI 41.52 kg/m   General appearance: alert, cooperative and no distress Heart: regular rate and rhythm, S1, S2 normal, no murmur, click, rub or gallop Lungs: clear to auscultation bilaterally Extremities: extremities normal, atraumatic, no cyanosis or edema and Homans sign is negative, no sign of DVT Wound: incisions healing  without infection   Diagnostic Studies & Laboratory data:     Recent Radiology Findings:   DG Chest 2 View  Result Date: 01/26/2020 CLINICAL DATA:  Interstitial lung disease EXAM: CHEST - 2 VIEW COMPARISON:  Chest radiograph January 11, 2020 and chest CT December 30, 2019. FINDINGS: There is postoperative change in the right upper lobe and right base regions. There is chronic blunting of the left costophrenic angle. The interstitium in the right base remains thickened, essentially stable. The left lung is clear. Heart size and pulmonary vascularity are normal. No adenopathy. No bone lesions. IMPRESSION: Postoperative changes on the right without evident pneumothorax. Apparent interstitial thickening and scarring right base with chronic blunting of the right costophrenic angle. No new opacity. Left lung clear. Stable cardiac silhouette. Electronically Signed   By: January 01, 2020 III M.D.   On: 01/26/2020 14:45    I have independently reviewed the above radiology studies  and reviewed the findings with the patient.    Recent Lab Findings: Lab Results  Component Value Date   WBC 9.8 01/11/2020   HGB 10.8 (L) 01/11/2020   HCT 35.9 (L) 01/11/2020   PLT 264 01/11/2020   GLUCOSE 121 (H) 01/11/2020   CHOL 148 04/28/2019   TRIG 149.0 04/28/2019   HDL 42.50 04/28/2019   LDLCALC 76 04/28/2019   ALT 18 01/11/2020   AST 17 01/11/2020   NA 141 01/11/2020   K 4.2 01/11/2020   CL 107 01/11/2020   CREATININE 0.70 01/11/2020   BUN 7 01/11/2020   CO2 24 01/11/2020   TSH 2.919 10/10/2019   INR 1.0 01/05/2020      Assessment / Plan:   Patient making good progress following recent robotic assisted wedge resections of the right lung-patient's repeat biopsy right- Incisions are healing well She works from home, Will return to work 1/2 day for two weeks then full time.  Will see her back as need   Medication Changes: No orders of the defined types were placed in this encounter.     01/07/2020 MD      [address] [place] [place] Office [phone]     01/27/2020 1:32 PM

## 2020-02-07 LAB — FUNGUS CULTURE WITH STAIN

## 2020-02-07 LAB — FUNGUS CULTURE RESULT

## 2020-02-07 LAB — FUNGAL ORGANISM REFLEX

## 2020-02-21 ENCOUNTER — Telehealth: Payer: Self-pay

## 2020-02-21 NOTE — Telephone Encounter (Signed)
Patient rescheduled appointment on 02/23/2020 to 03/08/2020. klh

## 2020-02-23 ENCOUNTER — Ambulatory Visit: Payer: 59 | Admitting: Internal Medicine

## 2020-02-28 LAB — ACID FAST CULTURE WITH REFLEXED SENSITIVITIES (MYCOBACTERIA): Acid Fast Culture: NEGATIVE

## 2020-03-06 ENCOUNTER — Telehealth: Payer: Self-pay

## 2020-03-06 NOTE — Telephone Encounter (Signed)
Called lmom informing patient of appointment on 03/08/2020. klh °

## 2020-03-08 ENCOUNTER — Encounter: Payer: Self-pay | Admitting: Internal Medicine

## 2020-03-08 ENCOUNTER — Ambulatory Visit: Payer: 59 | Admitting: Internal Medicine

## 2020-03-08 ENCOUNTER — Other Ambulatory Visit: Payer: Self-pay

## 2020-03-08 VITALS — BP 110/70 | HR 91 | Temp 97.5°F | Resp 16 | Ht 62.0 in | Wt 229.0 lb

## 2020-03-08 DIAGNOSIS — J8489 Other specified interstitial pulmonary diseases: Secondary | ICD-10-CM

## 2020-03-08 DIAGNOSIS — J8281 Chronic eosinophilic pneumonia: Secondary | ICD-10-CM

## 2020-03-08 DIAGNOSIS — R0602 Shortness of breath: Secondary | ICD-10-CM | POA: Diagnosis not present

## 2020-03-08 DIAGNOSIS — F1920 Other psychoactive substance dependence, uncomplicated: Secondary | ICD-10-CM

## 2020-03-08 NOTE — Patient Instructions (Signed)
Shortness of Breath, Adult Shortness of breath means you have trouble breathing. Shortness of breath could be a sign of a medical problem. Follow these instructions at home:   Watch for any changes in your symptoms.  Do not use any products that contain nicotine or tobacco, such as cigarettes, e-cigarettes, and chewing tobacco.  Do not smoke. Smoking can cause shortness of breath. If you need help to quit smoking, ask your doctor.  Avoid things that can make it harder to breathe, such as: ? Mold. ? Dust. ? Air pollution. ? Chemical smells. ? Things that can cause allergy symptoms (allergens), if you have allergies.  Keep your living space clean. Use products that help remove mold and dust.  Rest as needed. Slowly return to your normal activities.  Take over-the-counter and prescription medicines only as told by your doctor. This includes oxygen therapy and inhaled medicines.  Keep all follow-up visits as told by your doctor. This is important. Contact a doctor if:  Your condition does not get better as soon as expected.  You have a hard time doing your normal activities, even after you rest.  You have new symptoms. Get help right away if:  Your shortness of breath gets worse.  You have trouble breathing when you are resting.  You feel light-headed or you pass out (faint).  You have a cough that is not helped by medicines.  You cough up blood.  You have pain with breathing.  You have pain in your chest, arms, shoulders, or belly (abdomen).  You have a fever.  You cannot walk up stairs.  You cannot exercise the way you normally do. These symptoms may represent a serious problem that is an emergency. Do not wait to see if the symptoms will go away. Get medical help right away. Call your local emergency services (911 in the [place] Do not drive yourself to the hospital. Summary  Shortness of breath is when you have trouble breathing enough air. It can be a sign of a  medical problem.  Avoid things that make it hard for you to breathe, such as smoking, pollution, mold, and dust.  Watch for any changes in your symptoms. Contact your doctor if you do not get better or you get worse. This information is not intended to replace advice given to you by your health care provider. Make sure you discuss any questions you have with your health care provider. Document Revised: 03/08/2018 Document Reviewed: 03/08/2018 Elsevier Patient Education  2020 [employer]  

## 2020-03-08 NOTE — Progress Notes (Signed)
[facility] [place], [place] [address]  Pulmonary Sleep Medicine   Office Visit Note  Patient Name: [name] DOB: Jun 05, 1981 MRN [id number]  Date of Service: 03/08/2020  Complaints/HPI: Patient is here for follow-up from she went to the [facility] interstitial lung disease clinic and I have been able to review the chart and care everywhere they currently agree with the management however wanted to hold off on the Imuran.  She did state that they however told her that the Imuran may be used in the future.  The feeling is that she may have had a flareup of her chronic eosinophilic pneumonia and because she has been on the steroids it was felt that perhaps it was already in resolution phase so that it did not show up on the VATS lung biopsy.  The patient however did have nonspecific interstitial pneumonitis as noted previously.  ROS  General: (-) fever, (-) chills, (-) night sweats, (-) weakness Skin: (-) rashes, (-) itching,. Eyes: (-) visual changes, (-) redness, (-) itching. Nose and Sinuses: (-) nasal stuffiness or itchiness, (-) postnasal drip, (-) nosebleeds, (-) sinus trouble. Mouth and Throat: (-) sore throat, (-) hoarseness. Neck: (-) swollen glands, (-) enlarged thyroid, (-) neck pain. Respiratory: + cough, (-) bloody sputum, + shortness of breath, - wheezing. Cardiovascular: - ankle swelling, (-) chest pain. Lymphatic: (-) lymph node enlargement. Neurologic: (-) numbness, (-) tingling. Psychiatric: (-) anxiety, (-) depression   Current Medication: Outpatient Encounter Medications as of 03/08/2020  Medication Sig Note  . acetaminophen (TYLENOL) 500 MG tablet Take 2 tablets (1,000 mg total) by mouth every 6 (six) hours as needed.   Marland Kitchen albuterol (PROVENTIL HFA;VENTOLIN HFA) 108 (90 Base) MCG/ACT inhaler Inhale 2 puffs into the lungs every 4 (four) hours as needed for wheezing or shortness of breath.   Marland Kitchen azithromycin (ZITHROMAX) 250 MG tablet Take by  mouth.   . budesonide-formoterol (SYMBICORT) 160-4.5 MCG/ACT inhaler Inhale into the lungs.   Marland Kitchen levothyroxine (SYNTHROID) 125 MCG tablet TAKE 1 TABLET BY MOUTH ONCE DAILY EXCEPT TAKE 1 AND 1/2 TABLETS ON SUNDAYS (Patient taking differently: Take 125-187.5 mcg by mouth See admin instructions. Take 125 mcg daily except take 187.5 mcg on Sundays)   . [DISCONTINUED] azaTHIOprine (IMURAN) 50 MG tablet Take 0.5 tablets (25 mg total) by mouth daily. (Patient not taking: Reported on 01/26/2020) 01/26/2020: Pt has not started this yet; awaiting insurance approval  . [DISCONTINUED] oxyCODONE (OXY IR/ROXICODONE) 5 MG immediate release tablet Take 1 tablet (5 mg total) by mouth every 4 (four) hours as needed for severe pain. (Patient not taking: Reported on 03/08/2020)   . [DISCONTINUED] predniSONE (DELTASONE) 5 MG tablet Take as directed (Patient not taking: Reported on 03/08/2020)    No facility-administered encounter medications on file as of 03/08/2020.    Surgical History: Past Surgical History:  Procedure Laterality Date  . CESAREAN SECTION  2007  . CHOLECYSTECTOMY N/A 10/27/2016   Procedure: LAPAROSCOPIC CHOLECYSTECTOMY;  Surgeon: Jules Husbands, MD;  Location: [facility] ORS;  Service: General;  Laterality: N/A;  . DIAGNOSTIC LAPAROSCOPY  2018   X2  . FLEXIBLE BRONCHOSCOPY N/A 05/03/2019   Procedure: FLEXIBLE BRONCHOSCOPY;  Surgeon: Allyne Gee, MD;  Location: [facility] ORS;  Service: Pulmonary;  Laterality: N/A;  . LUNG BIOPSY Right 2008   partial right lung resection.  was treated with steroids x 1 year  . TONSILLECTOMY AND ADENOIDECTOMY    . VIDEO BRONCHOSCOPY N/A 01/09/2020   Procedure: VIDEO BRONCHOSCOPY;  Surgeon: Servando Snare,  Lilia Argue, MD;  Location: [facility];  Service: Thoracic;  Laterality: N/A;    Medical History: Past Medical History:  Diagnosis Date  . Anemia    Iron Deficiency in the past  . Asthma    in childhood  . Bowen's disease   . Complication of anesthesia    HARD TO WAKE UP SOMETIIMES  .  Dyspnea 04/2019   sob and coughing has increased of late  . Endometriosis    dx'd in high school  . Eosinophilic pneumonia ~24/2353   per Dr. Clayborn Bigness s/p VATS and 1 year of prednisone  . GERD (gastroesophageal reflux disease)    TUMS PRN  . Headache    MIGRAINES without aura,seldom  . History of abnormal Pap smear   . History of chicken pox   . Hypothyroidism    dx'd 2011  . Pneumonia 2009/ 6144   EOSINOPHILIC PNEUMONIA  . PONV (postoperative nausea and vomiting)    x1  . Seizures ([place])    childhood-previously on Tegretol-been off meds for years with no seizures  . Urinary tract bacterial infections     Family History: Family History  Problem Relation Age of Onset  . Mental illness Mother        bipolar  . Cancer Father        h/o throat cancer- treated  . Heart disease Maternal Grandfather        at ate >60  . Alcohol abuse Maternal Grandfather   . Stomach cancer Neg Hx   . Rectal cancer Neg Hx   . Esophageal cancer Neg Hx   . Colon cancer Neg Hx   . Breast cancer Neg Hx     Social History: Social History   Socioeconomic History  . Marital status: Married    Spouse name: cory  . Number of children: 2  . Years of education: Not on file  . Highest education level: Not on file  Occupational History  . Occupation: Passenger transport manager    Comment: currently working  Tobacco Use  . Smoking status: Former Smoker    Packs/day: 0.50    Years: 10.00    Pack years: 5.00    Types: Cigarettes    Quit date: 10/21/2004    Years since quitting: 15.3  . Smokeless tobacco: Never Used  Substance and Sexual Activity  . Alcohol use: Yes    Comment: rare  . Drug use: No  . Sexual activity: Yes    Partners: Male    Birth control/protection: Condom  Other Topics Concern  . Not on file  Social History Narrative   Caffeine use:  Yes   Regular exercise:  Yes   2 pregnancies = 2 live births, 1 son and 1 daughter   From [place], in [place] since 2006   Social Determinants of  Health   Financial Resource Strain:   . Difficulty of Paying Living Expenses:   Food Insecurity:   . Worried About Charity fundraiser in the Last Year:   . Arboriculturist in the Last Year:   Transportation Needs:   . Film/video editor (Medical):   Marland Kitchen Lack of Transportation (Non-Medical):   Physical Activity:   . Days of Exercise per Week:   . Minutes of Exercise per Session:   Stress:   . Feeling of Stress :   Social Connections:   . Frequency of Communication with Friends and Family:   . Frequency of Social Gatherings with Friends and Family:   .  Attends Religious Services:   . Active Member of Clubs or Organizations:   . Attends Archivist Meetings:   Marland Kitchen Marital Status:   Intimate Partner Violence:   . Fear of Current or Ex-Partner:   . Emotionally Abused:   Marland Kitchen Physically Abused:   . Sexually Abused:     Vital Signs: Blood pressure 110/70, pulse 91, temperature (!) 97.5 F (36.4 C), resp. rate 16, height _0  (1.575 m), weight 229 lb (103.9 kg), SpO2 99 %.  Examination: General Appearance: The patient is well-developed, well-nourished, and in no distress. Skin: Gross inspection of skin unremarkable. Head: normocephalic, no gross deformities. Eyes: no gross deformities noted. ENT: ears appear grossly normal no exudates. Neck: Supple. No thyromegaly. No LAD. Respiratory: no rhonchi noted. Cardiovascular: Normal S1 and S2 without murmur or rub. Extremities: No cyanosis. pulses are equal. Neurologic: Alert and oriented. No involuntary movements.  LABS: Recent Results (from the past 2160 hour(s))  SARS CORONAVIRUS 2 (TAT 6-24 HRS) Nasopharyngeal Nasopharyngeal Swab     Status: None   Collection Time: 01/05/20 11:43 AM   Specimen: Nasopharyngeal Swab  Result Value Ref Range   SARS Coronavirus 2 NEGATIVE NEGATIVE    Comment: (NOTE) SARS-CoV-2 target nucleic acids are NOT DETECTED. The SARS-CoV-2 RNA is generally detectable in upper and  lower respiratory specimens during the acute phase of infection. Negative results do not preclude SARS-CoV-2 infection, do not rule out co-infections with other pathogens, and should not be used as the sole basis for treatment or other patient management decisions. Negative results must be combined with clinical observations, patient history, and epidemiological information. The expected result is Negative. Fact Sheet for Patients: SugarRoll.be Fact Sheet for Healthcare Providers: https://www.woods-mathews.com/ This test is not yet approved or cleared by the [place] FDA and  has been authorized for detection and/or diagnosis of SARS-CoV-2 by FDA under an Emergency Use Authorization (EUA). This EUA will remain  in effect (meaning this test can be used) for the duration of the COVID-19 declaration under Section 56 4(b)(1) of the Act, 21 U.S.C. section 360bbb-3(b)(1), unless the authorization is terminated or revoked sooner. Performed at [facility], [place] [address], [place], [place] [address]   APTT     Status: None   Collection Time: 01/05/20  1:31 PM  Result Value Ref Range   aPTT 30 24 - 36 seconds    Comment: Performed at [facility] [address] [place], [place] [address]  CBC     Status: Abnormal   Collection Time: 01/05/20  1:31 PM  Result Value Ref Range   WBC 11.9 (H) 4.0 - 10.5 K/uL   RBC 4.48 3.87 - 5.11 MIL/uL   Hemoglobin 11.5 (L) 12.0 - 15.0 g/dL   HCT 37.8 36.0 - 46.0 %   MCV 84.4 80.0 - 100.0 fL   MCH 25.7 (L) 26.0 - 34.0 pg   MCHC 30.4 30.0 - 36.0 g/dL   RDW 14.9 11.5 - 15.5 %   Platelets 298 150 - 400 K/uL   nRBC 0.0 0.0 - 0.2 %    Comment: Performed at [facility], [place] [address], [place], [place] [address]  Comprehensive metabolic panel     Status: Abnormal   Collection Time: 01/05/20  1:31 PM  Result Value Ref Range   Sodium 140 135 - 145 mmol/L   Potassium 3.8 3.5 - 5.1 mmol/L    Chloride 108 98 - 111 mmol/L   CO2 21 (L) 22 -  32 mmol/L   Glucose, Bld 99 70 - 99 mg/dL    Comment: Glucose reference range applies only to samples taken after fasting for at least 8 hours.   BUN 8 6 - 20 mg/dL   Creatinine, Ser 0.71 0.44 - 1.00 mg/dL   Calcium 8.8 (L) 8.9 - 10.3 mg/dL   Total Protein 6.2 (L) 6.5 - 8.1 g/dL   Albumin 3.4 (L) 3.5 - 5.0 g/dL   AST 25 15 - 41 U/L   ALT 18 0 - 44 U/L   Alkaline Phosphatase 43 38 - 126 U/L   Total Bilirubin 0.9 0.3 - 1.2 mg/dL   GFR calc non Af Amer >60 >60 mL/min   GFR calc Af Amer >60 >60 mL/min   Anion gap 11 5 - 15    Comment: Performed at [facility] [address], [place], [place] [address]  Protime-INR     Status: None   Collection Time: 01/05/20  1:31 PM  Result Value Ref Range   Prothrombin Time 13.1 11.4 - 15.2 seconds   INR 1.0 0.8 - 1.2    Comment: (NOTE) INR goal varies based on device and disease states. Performed at [facility], [place] [address] [place], [place] [address]   Type and screen     Status: None   Collection Time: 01/05/20  1:54 PM  Result Value Ref Range   ABO/RH(D) O NEG    Antibody Screen NEG    Sample Expiration 01/19/2020,2359    Extend sample reason      NO TRANSFUSIONS OR PREGNANCY IN THE PAST 3 MONTHS Performed at [facility], [place] [address] [place], [place] [address]   Surgical pcr screen     Status: None   Collection Time: 01/05/20  1:55 PM   Specimen: Nasal Mucosa; Nasal Swab  Result Value Ref Range   MRSA, PCR NEGATIVE NEGATIVE   Staphylococcus aureus NEGATIVE NEGATIVE    Comment: (NOTE) The Xpert SA Assay (FDA approved for NASAL specimens in patients 46 years of age and older), is one component of a comprehensive surveillance program. It is not intended to diagnose infection nor to guide or monitor treatment. Performed at [facility], [place] [address] [place], [place] [address]   Urinalysis, Routine w reflex microscopic     Status: Abnormal    Collection Time: 01/05/20  1:55 PM  Result Value Ref Range   Color, Urine YELLOW YELLOW   APPearance CLOUDY (A) CLEAR   Specific Gravity, Urine 1.024 1.005 - 1.030   pH 5.0 5.0 - 8.0   Glucose, UA NEGATIVE NEGATIVE mg/dL   Hgb urine dipstick NEGATIVE NEGATIVE   Bilirubin Urine NEGATIVE NEGATIVE   Ketones, ur NEGATIVE NEGATIVE mg/dL   Protein, ur NEGATIVE NEGATIVE mg/dL   Nitrite NEGATIVE NEGATIVE   Leukocytes,Ua MODERATE (A) NEGATIVE   RBC / HPF 0-5 0 - 5 RBC/hpf   WBC, UA 21-50 0 - 5 WBC/hpf   Bacteria, UA RARE (A) NONE SEEN   Squamous Epithelial / LPF 21-50 0 - 5   Mucus PRESENT     Comment: Performed at [facility], [address] [place],  [address]  Blood gas, arterial on room air     Status: Abnormal   Collection Time: 01/05/20  2:00 PM  Result Value Ref Range   FIO2 21.00    pH, Arterial 7.453 (H) 7.350 - 7.450   pCO2 arterial 36.2 32.0 - 48.0 mmHg  pO2, Arterial 109 (H) 83.0 - 108.0 mmHg   Bicarbonate 25.0 20.0 - 28.0 mmol/L   Acid-Base Excess 1.4 0.0 - 2.0 mmol/L   O2 Saturation 98.5 %   Patient temperature 37.0    Collection site LEFT BRACHIAL    Drawn by [id number]    Sample type ARTERIAL DRAW    Allens test (pass/fail) BRACHIAL ARTERY (A) PASS    Comment: Performed at [facility], [place] [address] [place], [place] [address]  Pregnancy, urine POC     Status: None   Collection Time: 01/09/20  6:46 AM  Result Value Ref Range   Preg Test, Ur NEGATIVE NEGATIVE    Comment:        THE SENSITIVITY OF THIS METHODOLOGY IS >24 mIU/mL   Surgical pathology     Status: None   Collection Time: 01/09/20  8:48 AM  Result Value Ref Range   SURGICAL PATHOLOGY      SURGICAL PATHOLOGY CASE: [id number] PATIENT: [facility] [facility] Surgical Pathology Report     Clinical History: ILD (cm)     FINAL MICROSCOPIC DIAGNOSIS:  A. LUNG, RIGHT MIDDLE LOBE, WEDGE BIOPSY: - Chronic interstitial fibroinflammatory process (see comment)  B. LUNG, RIGHT  UPPER LOBE, WEDGE BIOPSY: - Lung parenchyma with minimal changes  C. LUNG, RIGHT LOWER LOBE, WEDGE BIOPSY: - Relatively mild interstitial fibroinflammatory process, mostly involving subpleural lung parenchyma. See comment   COMMENT:  A and C.   The wedge biopsy shows mostly diffuse interstitial fibrosis and patchy chronic inflammation with few lymphoid aggregates.  There is significant peribronchial metaplasia with focal microscopic honeycombing. Very rare fibroblast foci are present. Focal areas with organizing granulation-like tissue with squamoid metaplasia is seen. Clear cut architectural distortion is not identified.  Some of the interstitial fibrosis appears ropy and hyalin e - possibility of smoking-related interstitial fibrosis (SRIF) was considered but the degree of interstitial inflammation is significantly more that seen in SRIF. Patient's clinical history of eosinophilic pneumonia a few years ago was also noted. Only rare eosinophils are seen in the current biopsy but please note that treatment with steroids can lead to a dramatic decrease in the number of eosinophils in a short time. Granulomas are not identified.  Differential diagnosis mainly included usual interstitial pneumonia (UIP) and nonspecific interstitial pneumonia (NSIP). In absence of clear architectural distortion or heterogeneity of the fibrotic process, the findings appear to be most consistent with NSIP-like pattern but early/ evolving UIP cannot be entirely ruled out. NSIP may be seen secondary to collagen vascular disease, autoimmune diseases, drug reactions, chronic hypersensitivity pneumonia, and slowly resolving previous acute lung injury. Also, there seem to b e overlapping histologic features suggestive of resolving acute lung injury. Presence of lymphoid aggregates may suggest collagen vascular disease. Clinical and radiographic correlation is suggested.     GROSS DESCRIPTION:  A: The  specimen is received fresh and consists of a 3.0 x 1.5 x 0.7 cm piece of tan-pink lung parenchyma with stapled resection margin.  The pleural surface is smooth and hyperemic.  The outer edge of the specimen displays embedded staples.  Representative sections are submitted in 1 cassette.  B: The specimen is received fresh and consists of a wedge of lung parenchyma, measuring 3.1 x 1.7 x 1.2 cm.  The pleural surface is pink-purple, hyperemic, with stapled resection margin.  Sectioning reveals pink-red slightly congested parenchyma.  Representative sections are submitted in 2 cassettes.  C: The specimen is received fresh and consists of a 3.1 x 1.1 x  0.7 cm wedge of lung parenchyma with stapled resection margin.  The pleural surface is ta n-pink, hyperemic, and slightly disrupted.  Sectioning reveals pink-red parenchyma.  Representative sections are submitted in 2 cassettes. Craig Staggers 01/17/2020)    Final Diagnosis performed by Jaquita Folds, MD.   Electronically signed 01/17/2020 Technical component performed at [facility]. [facility], [place] [address] [place], [place] [address].  Professional component performed at [facility], [place] [address] [place], [place] [address].  Immunohistochemistry Technical component (if applicable) was performed at [facility]. [address] [place], [place], [place] [address].   IMMUNOHISTOCHEMISTRY DISCLAIMER (if applicable): Some of these immunohistochemical stains may have been developed and the performance characteristics determine by [facility]. Some may not have been cleared or approved by the U.S. Food and Drug Administration. The FDA has determined that such clearance or approval is not necessary. This test  is used for clinical purposes. It should not be regarded as investigational or for research. This laboratory is certified under the [place] (CLIA-88) as qualified to perform high complexity clinical laboratory testing.  The controls stained appropriately.   Fungus Culture With Stain     Status: None   Collection Time: 01/09/20 10:21 AM   Specimen: PATH Respiratory resection; Tissue  Result Value Ref Range   Fungus Stain Final report    Fungus (Mycology) Culture Final report     Comment: (NOTE) Performed At: [facility] [place], [place] [id number] Rush Farmer MD [id number]    Fungal Source LUNG     Comment: BIOPSY RIGHT UPPER   Aerobic/Anaerobic Culture (surgical/deep wound)     Status: None   Collection Time: 01/09/20 10:21 AM   Specimen: PATH Respiratory resection; Tissue  Result Value Ref Range   Specimen Description LUNG BIOPSY    Special Requests RIGHT UPPER    Gram Stain      FEW WBC PRESENT, PREDOMINANTLY MONONUCLEAR NO ORGANISMS SEEN    Culture      RARE STREPTOCOCCUS MITIS/ORALIS NO ANAEROBES ISOLATED Performed at [facility], [address] [place], [place] [address]    Report Status 01/14/2020 FINAL   Acid Fast Culture with reflexed sensitivities     Status: None   Collection Time: 01/09/20 10:21 AM   Specimen: PATH Respiratory resection; Tissue  Result Value Ref Range   Acid Fast Culture Negative     Comment: (NOTE) No acid fast bacilli isolated after 6 weeks. Performed At: [facility] [place], [place] [id number] Rush Farmer MD [id number]    Source of Sample LUNG     Comment: BIOPSY RIGHT UPPER   Acid Fast Smear (AFB)     Status: None   Collection Time: 01/09/20 10:21 AM   Specimen: PATH Respiratory resection; Tissue  Result Value Ref Range   AFB Specimen Processing Concentration    Acid Fast Smear Negative     Comment: (NOTE) Performed At: [facility] [place], [place] [id number] Rush Farmer MD [id number]    Source (AFB) LUNG     Comment: BIOPSY RIGHT UPPER   Fungus Culture  Result     Status: None   Collection Time: 01/09/20 10:21 AM  Result Value Ref Range   Result 1 Comment     Comment: (NOTE) KOH/Calcofluor preparation:  no fungus observed. Performed At: [facility] [address] [place], [place] [id number] Rush Farmer MD [id number]   Fungal organism  reflex     Status: None   Collection Time: 01/09/20 10:21 AM  Result Value Ref Range   Fungal result 1 Comment     Comment: (NOTE) No yeast or mold isolated after 4 weeks. Performed At: [facility] [place], [place] [id number] Rush Farmer MD [id number]   CBC     Status: Abnormal   Collection Time: 01/10/20  3:49 AM  Result Value Ref Range   WBC 12.9 (H) 4.0 - 10.5 K/uL   RBC 4.01 3.87 - 5.11 MIL/uL   Hemoglobin 10.2 (L) 12.0 - 15.0 g/dL   HCT 33.7 (L) 36.0 - 46.0 %   MCV 84.0 80.0 - 100.0 fL   MCH 25.4 (L) 26.0 - 34.0 pg   MCHC 30.3 30.0 - 36.0 g/dL   RDW 14.8 11.5 - 15.5 %   Platelets 272 150 - 400 K/uL   nRBC 0.0 0.0 - 0.2 %    Comment: Performed at [facility], [place] [address], [place], [place] [address]  Basic metabolic panel     Status: Abnormal   Collection Time: 01/10/20  3:49 AM  Result Value Ref Range   Sodium 139 135 - 145 mmol/L   Potassium 3.9 3.5 - 5.1 mmol/L   Chloride 106 98 - 111 mmol/L   CO2 28 22 - 32 mmol/L   Glucose, Bld 120 (H) 70 - 99 mg/dL    Comment: Glucose reference range applies only to samples taken after fasting for at least 8 hours.   BUN <5 (L) 6 - 20 mg/dL   Creatinine, Ser 0.65 0.44 - 1.00 mg/dL   Calcium 8.1 (L) 8.9 - 10.3 mg/dL   GFR calc non Af Amer >60 >60 mL/min   GFR calc Af Amer >60 >60 mL/min   Anion gap 5 5 - 15    Comment: Performed at [facility] [address], [place], [place] [address]  CBC     Status: Abnormal   Collection Time: 01/11/20  2:42 AM  Result Value Ref Range   WBC 9.8 4.0 - 10.5 K/uL   RBC 4.23 3.87 - 5.11 MIL/uL   Hemoglobin 10.8 (L) 12.0 - 15.0 g/dL   HCT 35.9  (L) 36.0 - 46.0 %   MCV 84.9 80.0 - 100.0 fL   MCH 25.5 (L) 26.0 - 34.0 pg   MCHC 30.1 30.0 - 36.0 g/dL   RDW 15.2 11.5 - 15.5 %   Platelets 264 150 - 400 K/uL   nRBC 0.0 0.0 - 0.2 %    Comment: Performed at [facility], [place] [address] [place], [place] [address]  Comprehensive metabolic panel     Status: Abnormal   Collection Time: 01/11/20  2:42 AM  Result Value Ref Range   Sodium 141 135 - 145 mmol/L   Potassium 4.2 3.5 - 5.1 mmol/L   Chloride 107 98 - 111 mmol/L   CO2 24 22 - 32 mmol/L   Glucose, Bld 121 (H) 70 - 99 mg/dL    Comment: Glucose reference range applies only to samples taken after fasting for at least 8 hours.   BUN 7 6 - 20 mg/dL   Creatinine, Ser 0.70 0.44 - 1.00 mg/dL   Calcium 8.5 (L) 8.9 - 10.3 mg/dL   Total Protein 5.5 (L) 6.5 - 8.1 g/dL   Albumin 2.8 (L) 3.5 - 5.0 g/dL   AST 17 15 - 41 U/L   ALT 18 0 - 44 U/L  Alkaline Phosphatase 38 38 - 126 U/L   Total Bilirubin 0.7 0.3 - 1.2 mg/dL   GFR calc non Af Amer >60 >60 mL/min   GFR calc Af Amer >60 >60 mL/min   Anion gap 10 5 - 15    Comment: Performed at [facility] [address], [place], [place] [address]  Sed Rate (ESR)     Status: None   Collection Time: 01/23/20 11:06 AM  Result Value Ref Range   Sed Rate 12 0 - 32 mm/hr  Rheumatoid Factor     Status: None   Collection Time: 01/23/20 11:06 AM  Result Value Ref Range   Rhuematoid fact SerPl-aCnc <10.0 0.0 - 13.9 IU/mL  Glomerular Membrane Antibodies     Status: None   Collection Time: 01/23/20 11:06 AM  Result Value Ref Range   Antiglomerular BM Ab, Qn 5 0 - 20 units    Comment:                    Negative                   0 - 20                    Weak Positive             21 - 30                    Moderate to Strong Positive   >30   Scleroderma Diagnostic Profile     Status: Abnormal   Collection Time: 01/23/20 11:06 AM  Result Value Ref Range   Anti Nuclear Antibody (ANA) Positive (A) Negative   Scleroderma (Scl-70) (ENA)  Antibody, IgG <0.2 0.0 - 0.9 AI    Radiology: DG Chest 2 View  Result Date: 01/26/2020 CLINICAL DATA:  Interstitial lung disease EXAM: CHEST - 2 VIEW COMPARISON:  Chest radiograph January 11, 2020 and chest CT December 30, 2019. FINDINGS: There is postoperative change in the right upper lobe and right base regions. There is chronic blunting of the left costophrenic angle. The interstitium in the right base remains thickened, essentially stable. The left lung is clear. Heart size and pulmonary vascularity are normal. No adenopathy. No bone lesions. IMPRESSION: Postoperative changes on the right without evident pneumothorax. Apparent interstitial thickening and scarring right base with chronic blunting of the right costophrenic angle. No new opacity. Left lung clear. Stable cardiac silhouette. Electronically Signed   By: Lowella Grip III M.D.   On: 01/26/2020 14:45    No results found.  No results found.    Assessment and Plan: Patient Active Problem List   Diagnosis Date Noted  . S/P Robotic Assisted Right side lung biopsy (wedge RUL, RML, RLL) 01/10/2020  . Interstitial lung disease ([place]) 01/09/2020  . Routine general medical examination at a health care facility 05/08/2019  . Lower respiratory infection 01/26/2019  . Lumbar radicular pain 12/21/2017  . Advance care planning 05/04/2017  . Calculus of gallbladder without cholecystitis without obstruction 08/05/2016  . Migraine without aura 04/18/2012  . Cough 02/06/2012  . Eosinophilic pneumonia 35/00/9381  . Hypothyroidism 02/07/2011    1. NSIP currently is off of steroids also has been asked to hold on the Imuran by interstitial lung clinic.  We will continue to assess.  I did review the lab work that was done at the other facility which came back positive for ANA titer ANCA and other indicators  were negative. 2. Steroid Dependence she is currently off of steroids still has significant weight gain she is going to work on trying to  slowly improve her diet and exercise and work on losing some weight 3. Eosinophilic pneumonia felt to be the culprit still at this point patient is off of steroids clinically she is doing better. 4. Shortness of breath patient was started on azithromycin for anti-inflammatory properties she will be continued on current regimen and we will continue to monitor her closely.  General Counseling: I have discussed the findings of the evaluation and examination with Anderson Malta.  I have also discussed any further diagnostic evaluation thatmay be needed or ordered today. Madlyn verbalizes understanding of the findings of todays visit. We also reviewed her medications today and discussed drug interactions and side effects including but not limited excessive drowsiness and altered mental states. We also discussed that there is always a risk not just to her but also people around her. she has been encouraged to call the office with any questions or concerns that should arise related to todays visit.  No orders of the defined types were placed in this encounter.    Time spent: 57  I have personally obtained a history, examined the patient, evaluated laboratory and imaging results, formulated the assessment and plan and placed orders.    Allyne Gee, MD [facility] Pulmonary and Critical Care Sleep medicine

## 2020-04-19 ENCOUNTER — Ambulatory Visit: Payer: 59 | Admitting: Internal Medicine

## 2020-06-04 ENCOUNTER — Other Ambulatory Visit: Payer: Self-pay | Admitting: Family Medicine

## 2020-06-04 DIAGNOSIS — E039 Hypothyroidism, unspecified: Secondary | ICD-10-CM

## 2020-07-05 ENCOUNTER — Telehealth: Payer: Self-pay

## 2020-07-05 NOTE — Telephone Encounter (Signed)
CONFIRMED PT APPT 07/09/20. BR 

## 2020-07-09 ENCOUNTER — Ambulatory Visit: Payer: 59 | Admitting: Internal Medicine

## 2020-07-19 ENCOUNTER — Encounter: Payer: 59 | Admitting: Hospice and Palliative Medicine

## 2020-08-11 ENCOUNTER — Other Ambulatory Visit: Payer: Self-pay | Admitting: Family Medicine

## 2020-08-11 DIAGNOSIS — E039 Hypothyroidism, unspecified: Secondary | ICD-10-CM

## 2020-08-13 NOTE — Telephone Encounter (Signed)
Electronic refill request. Levothyroxine Last office visit:   05/05/2019 CPE Last Filled:    98 tablet 0 06/05/2020   Notice was given at last RF to schedule appt.  No appt scheduled.

## 2020-08-14 NOTE — Telephone Encounter (Signed)
Patient advised.

## 2020-08-14 NOTE — Telephone Encounter (Signed)
Sent. Thanks.  Please schedule CPE.   

## 2020-10-18 ENCOUNTER — Other Ambulatory Visit: Payer: Self-pay | Admitting: Family Medicine

## 2020-10-18 DIAGNOSIS — E039 Hypothyroidism, unspecified: Secondary | ICD-10-CM

## 2020-11-03 ENCOUNTER — Other Ambulatory Visit: Payer: Self-pay | Admitting: Family Medicine

## 2020-11-03 DIAGNOSIS — E039 Hypothyroidism, unspecified: Secondary | ICD-10-CM

## 2020-12-28 ENCOUNTER — Other Ambulatory Visit: Payer: Self-pay | Admitting: Family Medicine

## 2020-12-28 DIAGNOSIS — E039 Hypothyroidism, unspecified: Secondary | ICD-10-CM

## 2020-12-31 NOTE — Telephone Encounter (Signed)
Patient needs office visit before rx can be refilled; please call to set up and then send back once scheduled and will send to Dr. Para March for refill.

## 2021-01-01 NOTE — Telephone Encounter (Signed)
LVMTCB

## 2021-01-18 ENCOUNTER — Encounter: Payer: 59 | Admitting: Family Medicine

## 2021-02-22 ENCOUNTER — Emergency Department (HOSPITAL_COMMUNITY)
Admission: EM | Admit: 2021-02-22 | Discharge: 2021-02-23 | Disposition: A | Discharge status: Home / Self Care | Payer: 59 | Attending: Emergency Medicine | Admitting: Emergency Medicine

## 2021-02-22 ENCOUNTER — Other Ambulatory Visit: Payer: Self-pay

## 2021-02-22 DIAGNOSIS — Z7952 Long term (current) use of systemic steroids: Secondary | ICD-10-CM | POA: Diagnosis not present

## 2021-02-22 DIAGNOSIS — M25561 Pain in right knee: Secondary | ICD-10-CM | POA: Diagnosis not present

## 2021-02-22 DIAGNOSIS — J189 Pneumonia, unspecified organism: Secondary | ICD-10-CM | POA: Insufficient documentation

## 2021-02-22 DIAGNOSIS — S3991XA Unspecified injury of abdomen, initial encounter: Secondary | ICD-10-CM

## 2021-02-22 DIAGNOSIS — Z79899 Other long term (current) drug therapy: Secondary | ICD-10-CM | POA: Insufficient documentation

## 2021-02-22 DIAGNOSIS — J45909 Unspecified asthma, uncomplicated: Secondary | ICD-10-CM | POA: Insufficient documentation

## 2021-02-22 DIAGNOSIS — E039 Hypothyroidism, unspecified: Secondary | ICD-10-CM | POA: Insufficient documentation

## 2021-02-22 DIAGNOSIS — S301XXA Contusion of abdominal wall, initial encounter: Secondary | ICD-10-CM | POA: Diagnosis not present

## 2021-02-22 DIAGNOSIS — S20219A Contusion of unspecified front wall of thorax, initial encounter: Secondary | ICD-10-CM | POA: Diagnosis not present

## 2021-02-22 DIAGNOSIS — M25522 Pain in left elbow: Secondary | ICD-10-CM | POA: Insufficient documentation

## 2021-02-22 DIAGNOSIS — Y92410 Unspecified street and highway as the place of occurrence of the external cause: Secondary | ICD-10-CM | POA: Insufficient documentation

## 2021-02-22 DIAGNOSIS — Z87891 Personal history of nicotine dependence: Secondary | ICD-10-CM | POA: Insufficient documentation

## 2021-02-22 DIAGNOSIS — S29001A Unspecified injury of muscle and tendon of front wall of thorax, initial encounter: Secondary | ICD-10-CM | POA: Diagnosis present
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
## 2021-02-23 ENCOUNTER — Emergency Department (HOSPITAL_COMMUNITY): Payer: 59

## 2021-02-23 DIAGNOSIS — S20219A Contusion of unspecified front wall of thorax, initial encounter: Secondary | ICD-10-CM | POA: Diagnosis not present

## 2021-02-23 LAB — COMPREHENSIVE METABOLIC PANEL
ALT: 13 U/L (ref 0–44)
AST: 17 U/L (ref 15–41)
Albumin: 3.8 g/dL (ref 3.5–5.0)
Alkaline Phosphatase: 53 U/L (ref 38–126)
Anion gap: 8 (ref 5–15)
BUN: 6 mg/dL (ref 6–20)
CO2: 24 mmol/L (ref 22–32)
Calcium: 9.1 mg/dL (ref 8.9–10.3)
Chloride: 105 mmol/L (ref 98–111)
Creatinine, Ser: 0.7 mg/dL (ref 0.44–1.00)
GFR, Estimated: >60 mL/min (ref >60)
Glucose, Bld: 144 mg/dL — ABNORMAL HIGH (ref 70–99)
Potassium: 3.9 mmol/L (ref 3.5–5.1)
Sodium: 137 mmol/L (ref 135–145)
Total Bilirubin: 0.2 mg/dL — ABNORMAL LOW (ref 0.3–1.2)
Total Protein: 6.6 g/dL (ref 6.5–8.1)

## 2021-02-23 LAB — CBC WITH DIFFERENTIAL/PLATELET
Abs Immature Granulocytes: 0.15 10*3/uL — ABNORMAL HIGH (ref 0.00–0.07)
Basophils Absolute: 0 10*3/uL (ref 0.0–0.1)
Basophils Relative: 0 %
Eosinophils Absolute: 0 10*3/uL (ref 0.0–0.5)
Eosinophils Relative: 0 %
HCT: 42.5 % (ref 36.0–46.0)
Hemoglobin: 13.7 g/dL (ref 12.0–15.0)
Immature Granulocytes: 1 %
Lymphocytes Relative: 6 %
Lymphs Abs: 0.9 10*3/uL (ref 0.7–4.0)
MCH: 28.4 pg (ref 26.0–34.0)
MCHC: 32.2 g/dL (ref 30.0–36.0)
MCV: 88.2 fL (ref 80.0–100.0)
Monocytes Absolute: 0.3 10*3/uL (ref 0.1–1.0)
Monocytes Relative: 2 %
Neutro Abs: 13.5 10*3/uL — ABNORMAL HIGH (ref 1.7–7.7)
Neutrophils Relative %: 91 %
Platelets: 316 10*3/uL (ref 150–400)
RBC: 4.82 MIL/uL (ref 3.87–5.11)
RDW: 13 % (ref 11.5–15.5)
WBC: 14.9 10*3/uL — ABNORMAL HIGH (ref 4.0–10.5)
nRBC: 0 % (ref 0.0–0.2)

## 2021-02-23 LAB — POC URINE PREG, ED: Preg Test, Ur: NEGATIVE

## 2021-02-23 MED ORDER — CEFDINIR 300 MG PO CAPS: 300.0000 mg | ORAL_CAPSULE | Freq: Two times a day (BID) | ORAL | 0 refills | Status: DC

## 2021-02-23 MED ORDER — IOHEXOL 300 MG/ML  SOLN
50.0000 mL | Freq: Once | INTRAMUSCULAR | Status: AC | PRN
  Administered 2021-02-23: 50 mL via INTRAVENOUS

## 2021-02-23 MED ORDER — CEFDINIR 300 MG PO CAPS
300.0000 mg | ORAL_CAPSULE | ORAL | Status: AC
  Administered 2021-02-23: 300 mg via ORAL
  Filled 2021-02-23: qty 1

## 2021-02-23 NOTE — ED Notes (Signed)
Patient given discharge instructions. Questions were answered. Patient verbalized understanding of discharge instructions and care at home.  
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
## 2021-03-06 NOTE — Telephone Encounter (Signed)
lvmtcb

## 2021-03-12 ENCOUNTER — Encounter: Payer: Self-pay | Admitting: Family Medicine

## 2021-03-12 NOTE — Telephone Encounter (Signed)
Refill request for synthroid; patient was due for appt but has not been able to be reached. Letter has also been sent to patient. Okay to send?

## 2021-03-12 NOTE — Telephone Encounter (Signed)
Sent out letter

## 2021-03-13 NOTE — Telephone Encounter (Signed)
Patient has been contacted 3 times already and letter sent. I tried again today and LMTCB to schedule.

## 2021-03-13 NOTE — Telephone Encounter (Signed)
Sent. Thanks.  I put a message on the refill for her to call the clinic. Please see if you can contact her by phone.

## 2021-05-01 LAB — HM PAP SMEAR: HM Pap smear: NEGATIVE

## 2021-05-07 ENCOUNTER — Other Ambulatory Visit: Payer: Self-pay | Admitting: Family Medicine

## 2021-05-07 DIAGNOSIS — E039 Hypothyroidism, unspecified: Secondary | ICD-10-CM

## 2021-05-08 NOTE — Telephone Encounter (Signed)
Refill request for synthroid 125 mcg tablets  LOV - 05/05/2019 Next OV - not scheduled yet; I called and left message for patient to call and schedule appt.  Last refill - 03/13/21 #97/0

## 2021-05-08 NOTE — Telephone Encounter (Signed)
Agree.  Thanks.  Sent.

## 2021-09-06 ENCOUNTER — Ambulatory Visit (INDEPENDENT_AMBULATORY_CARE_PROVIDER_SITE_OTHER): Payer: 59 | Admitting: Family Medicine

## 2021-09-06 ENCOUNTER — Encounter: Payer: Self-pay | Admitting: Family Medicine

## 2021-09-06 ENCOUNTER — Other Ambulatory Visit: Payer: Self-pay

## 2021-09-06 DIAGNOSIS — J849 Interstitial pulmonary disease, unspecified: Secondary | ICD-10-CM | POA: Diagnosis not present

## 2021-09-06 DIAGNOSIS — E039 Hypothyroidism, unspecified: Secondary | ICD-10-CM

## 2021-09-06 LAB — TSH: TSH: 3.14 u[IU]/mL (ref 0.35–5.50)

## 2021-09-06 MED ORDER — MYCOPHENOLATE MOFETIL 500 MG PO TABS: 1500.0000 mg | ORAL_TABLET | Freq: Two times a day (BID) | ORAL | Status: DC

## 2021-09-06 MED ORDER — LEVOTHYROXINE SODIUM 125 MCG PO TABS: 125.0000 ug | ORAL_TABLET | Freq: Every day | ORAL | 3 refills | Status: DC

## 2021-09-06 MED ORDER — AZITHROMYCIN 250 MG PO TABS: 250.0000 mg | ORAL_TABLET | Freq: Every day | ORAL | Status: DC

## 2021-09-06 NOTE — Progress Notes (Signed)
This visit occurred during the SARS-CoV-2 public health emergency.  Safety protocols were in place, including screening questions prior to the visit, additional usage of staff PPE, and extensive cleaning of exam room while observing appropriate contact time as indicated for disinfecting solutions.  She had [facility] f/u in the meantime with pulmonary.  She is off prednisone.  She is breathing better.  Last CBC was normal.  Discussed with patient about her situation in general, I will defer to [facility].  She agrees with plan.  Med list updated.  Hypothyroidism.  No neck mass, no dysphagia.  Compliant with replacement.  TSH pending.  See notes on labs.  Meds, vitals, and allergies reviewed.   ROS: Per HPI unless specifically indicated in ROS section   Nad Ncat Neck supple, no LA, no thyromegaly Rrr Ctab Abd soft, not ttp Skin well perfused.

## 2021-09-06 NOTE — Patient Instructions (Signed)
Go to the lab on the way out.   If you have mychart we'll likely use that to update you.    Don't change your meds.  Take care.  Glad to see you.

## 2021-09-07 NOTE — Assessment & Plan Note (Signed)
Continue replacement with levothyroxine.  See notes on labs.

## 2021-09-07 NOTE — Assessment & Plan Note (Signed)
Per [facility].  I will defer.  She agrees with plan.

## 2021-10-23 ENCOUNTER — Ambulatory Visit
Admission: EM | Admit: 2021-10-23 | Discharge: 2021-10-23 | Disposition: A | Discharge status: Home / Self Care | Payer: 59 | Attending: Medical Oncology | Admitting: Medical Oncology

## 2021-10-23 ENCOUNTER — Telehealth: Payer: Self-pay | Admitting: Family Medicine

## 2021-10-23 ENCOUNTER — Other Ambulatory Visit: Payer: Self-pay

## 2021-10-23 DIAGNOSIS — M545 Low back pain, unspecified: Secondary | ICD-10-CM | POA: Diagnosis present

## 2021-10-23 DIAGNOSIS — R8290 Unspecified abnormal findings in urine: Secondary | ICD-10-CM

## 2021-10-23 LAB — URINALYSIS, COMPLETE (UACMP) WITH MICROSCOPIC
Bilirubin Urine: NEGATIVE
Glucose, UA: NEGATIVE mg/dL
Hgb urine dipstick: NEGATIVE
Ketones, ur: NEGATIVE mg/dL
Leukocytes,Ua: NEGATIVE
Nitrite: NEGATIVE
Protein, ur: NEGATIVE mg/dL
Specific Gravity, Urine: 1.025 (ref 1.005–1.030)
pH: 6 (ref 5.0–8.0)

## 2021-10-23 MED ORDER — SULFAMETHOXAZOLE-TRIMETHOPRIM 800-160 MG PO TABS
1.0000 | ORAL_TABLET | Freq: Two times a day (BID) | ORAL | 0 refills | Status: AC
Start: 2021-10-23 — End: 2021-10-26

## 2021-10-23 NOTE — Telephone Encounter (Signed)
Pt called stating that she went to the Urgent Care today concerning pain in her lower back around her kidney area. Pt would like for Dr Para March to review the notes to see if he would like for her to come in for a visit. Please advise.

## 2021-10-23 NOTE — ED Triage Notes (Signed)
Pt here with C/O lower back pain since Sunday, has started to radiate to abdominal pain. Tylenol does help.

## 2021-10-23 NOTE — ED Provider Notes (Signed)
[facility]    CSN: [id number] Arrival date & time: 10/23/21  0817      History   Chief Complaint Chief Complaint  Patient presents with   Back Pain    HPI [name] is a 41 y.o. female.   HPI  Back Pain: Patient reports that since Sunday she has had bilateral low back pain. Now radiating to abdomen. No dysuria, hematuria, vomiting, constipation, fevers. No radiation down legs or history of injury. Tylenol has helped. LMP: 09/26/2021- denies risk of pregnancy.    Past Medical History:  Diagnosis Date   Anemia    Iron Deficiency in the past   Asthma    in childhood   Bowen's disease    Complication of anesthesia    HARD TO WAKE UP SOMETIIMES   Dyspnea 04/2019   sob and coughing has increased of late   Endometriosis    dx'd in high school   Eosinophilic pneumonia ([place]) ~37/1062   per Dr. Clayborn Bigness s/p VATS and 1 year of prednisone   GERD (gastroesophageal reflux disease)    TUMS PRN   Headache    MIGRAINES without aura,seldom   History of abnormal Pap smear    History of chicken pox    Hypothyroidism    dx'd 2011   Pneumonia 2009/ 6948   EOSINOPHILIC PNEUMONIA   PONV (postoperative nausea and vomiting)    x1   Seizures ([place])    childhood-previously on Tegretol-been off meds for years with no seizures   Urinary tract bacterial infections     Patient Active Problem List   Diagnosis Date Noted   S/P Robotic Assisted Right side lung biopsy (wedge RUL, RML, RLL) 01/10/2020   Interstitial lung disease ([place]) 01/09/2020   Routine general medical examination at a health care facility 05/08/2019   Lower respiratory infection 01/26/2019   Lumbar radicular pain 12/21/2017   Advance care planning 05/04/2017   Calculus of gallbladder without cholecystitis without obstruction 08/05/2016   Migraine without aura 04/18/2012   Cough 54/62/7035   Eosinophilic pneumonia ([place]) 00/93/8182   Hypothyroidism 02/07/2011    Past Surgical History:   Procedure Laterality Date   CESAREAN SECTION  2007   CHOLECYSTECTOMY N/A 10/27/2016   Procedure: LAPAROSCOPIC CHOLECYSTECTOMY;  Surgeon: Jules Husbands, MD;  Location: [facility];  Service: General;  Laterality: N/A;   DIAGNOSTIC LAPAROSCOPY  2018   X2   FLEXIBLE BRONCHOSCOPY N/A 05/03/2019   Procedure: FLEXIBLE BRONCHOSCOPY;  Surgeon: Allyne Gee, MD;  Location: [facility];  Service: Pulmonary;  Laterality: N/A;   LUNG BIOPSY Right 2008   partial right lung resection.  was treated with steroids x 1 year   TONSILLECTOMY AND ADENOIDECTOMY     VIDEO BRONCHOSCOPY N/A 01/09/2020   Procedure: VIDEO BRONCHOSCOPY;  Surgeon: Grace Isaac, MD;  Location: [place];  Service: Thoracic;  Laterality: N/A;    OB History   No obstetric history on file.      Home Medications    Prior to Admission medications   Medication Sig Start Date End Date Taking? Authorizing Provider  azithromycin (ZITHROMAX) 250 MG tablet Take 1 tablet (250 mg total) by mouth daily. 09/06/21  Yes [name], MD  budesonide-formoterol [facility]) 160-4.5 MCG/ACT inhaler Inhale into the lungs. 03/05/20 10/23/21 Yes [provider]  levothyroxine (SYNTHROID) 125 MCG tablet Take 1 tablet (125 mcg total) by mouth daily before breakfast. Except for 1.5 tabs on Sundays. 09/06/21  Yes [name], MD  mycophenolate (  CELLCEPT) 500 MG tablet Take 3 tablets (1,500 mg total) by mouth 2 (two) times daily. 09/06/21  Yes [name], MD    Family History Family History  Problem Relation Age of Onset   Mental illness Mother        bipolar   Cancer Father        h/o throat cancer- treated   Heart disease Maternal Grandfather        at ate >60   Alcohol abuse Maternal Grandfather    Stomach cancer Neg Hx    Rectal cancer Neg Hx    Esophageal cancer Neg Hx    Colon cancer Neg Hx    Breast cancer Neg Hx     Social History Social History   Tobacco Use   Smoking status: Former    Packs/day: 0.50    Years:  10.00    Pack years: 5.00    Types: Cigarettes    Quit date: 10/21/2004    Years since quitting: 17.0   Smokeless tobacco: Never  Vaping Use   Vaping Use: Never used  Substance Use Topics   Alcohol use: Yes    Comment: rare   Drug use: No     Allergies   Singulair [montelukast]   Review of Systems Review of Systems  As stated above in HPI Physical Exam Triage Vital Signs ED Triage Vitals  Enc Vitals Group     BP 10/23/21 0901 113/81     Pulse Rate 10/23/21 0901 94     Resp 10/23/21 0901 18     Temp 10/23/21 0901 99.1 F (37.3 C)     Temp Source 10/23/21 0901 Oral     SpO2 10/23/21 0901 97 %     Weight 10/23/21 0900 210 lb (95.3 kg)     Height 10/23/21 0900 _0  (1.575 m)     Head Circumference --      Peak Flow --      Pain Score 10/23/21 0858 9     Pain Loc --      Pain Edu? --      Excl. in [place]? --    No data found.  Updated Vital Signs BP 113/81 (BP Location: Right Arm)    Pulse 94    Temp 99.1 F (37.3 C) (Oral)    Resp 18    Ht _1  (1.575 m)    Wt 210 lb (95.3 kg)    LMP 09/26/2021    SpO2 97%    BMI 38.41 kg/m   Physical Exam Vitals and nursing note reviewed.  Constitutional:      General: She is not in acute distress.    Appearance: Normal appearance. She is not ill-appearing, toxic-appearing or diaphoretic.  Cardiovascular:     Rate and Rhythm: Normal rate and regular rhythm.     Heart sounds: Normal heart sounds.  Pulmonary:     Effort: Pulmonary effort is normal.     Breath sounds: Normal breath sounds.  Abdominal:     General: Bowel sounds are normal. There is no distension.     Palpations: Abdomen is soft. There is no mass.     Tenderness: There is no abdominal tenderness. There is no right CVA tenderness, left CVA tenderness, guarding or rebound.     Hernia: No hernia is present.  Musculoskeletal:     Comments: No midline tenderness of spine or reproducible tenderness of low back muscles  Skin:    General: Skin is warm.  Neurological:  Mental Status: She is alert and oriented to person, place, and time.     UC Treatments / Results  Labs (all labs ordered are listed, but only abnormal results are displayed) Labs Reviewed  URINALYSIS, COMPLETE (UACMP) WITH MICROSCOPIC    EKG   Radiology No results found.  Procedures Procedures (including critical care time)  Medications Ordered in UC Medications - No data to display  Initial Impression / Assessment and Plan / UC Course  I have reviewed the triage vital signs and the nursing notes.  Pertinent labs & imaging results that were available during my care of the patient were reviewed by me and considered in my medical decision making (see chart for details).     New. Few bacteria seen in UA. No tenderness to palpation of back. With flank pain and low back pain along with bacteria in urine this likely represents a UTI however we did discuss red flag signs and symptoms. For now since symptoms are progressing and she has a low grade fever will treat with bactrim while we await culture. Discussed how to use along with recommendation to stay hydrated.    Final Clinical Impressions(s) / UC Diagnoses   Final diagnoses:  None   Discharge Instructions   None    ED Prescriptions   None    PDMP not reviewed this encounter.   Hughie Closs, [place] 10/23/21 [phone]

## 2021-10-23 NOTE — Telephone Encounter (Signed)
It looks like she was treated for UTI based on her symptoms and urinalysis results.  Based on the record review this makes sense.  She would need to get rechecked if she is not improving or if she is getting worse.  Thanks.

## 2021-10-24 NOTE — Telephone Encounter (Signed)
Left patient a voicemail to return call.

## 2021-10-24 NOTE — Telephone Encounter (Signed)
Pt missed her call, please give her a cb.

## 2021-10-24 NOTE — Telephone Encounter (Signed)
Pt would like a call back

## 2021-10-24 NOTE — Telephone Encounter (Signed)
Patient notified without any further questions or concerns  

## 2022-02-05 ENCOUNTER — Encounter: Payer: Self-pay | Admitting: Family Medicine

## 2022-02-05 ENCOUNTER — Ambulatory Visit (INDEPENDENT_AMBULATORY_CARE_PROVIDER_SITE_OTHER): Payer: 59 | Admitting: Family Medicine

## 2022-02-05 VITALS — BP 100/72 | HR 83 | Temp 98.7°F | Ht 62.0 in | Wt 212.4 lb

## 2022-02-05 DIAGNOSIS — G5731 Lesion of lateral popliteal nerve, right lower limb: Secondary | ICD-10-CM

## 2022-02-05 NOTE — Progress Notes (Signed)
? ? ?Tonette Koehne T. Oziel Beitler, MD, CAQ Sports Medicine ?Nature conservation officer at [facility] ?[address] [place] ?[place], [address] ? ?Phone: [phone]  FAX: [phone] ? ?[name] - 41 y.o. female  MRN [id number]  Date of Birth: 15-Oct-1981 ? ?Date: 02/05/2022  PCP: Joaquim Nam, MD  Referral: Joaquim Nam, MD ? ?Chief Complaint  ?Patient presents with  ? Leg Swelling  ?  Right Lower Leg with numbness  ? ? ?This visit occurred during the SARS-CoV-2 public health emergency.  Safety protocols were in place, including screening questions prior to the visit, additional usage of staff PPE, and extensive cleaning of exam room while observing appropriate contact time as indicated for disinfecting solutions.  ? ?Subjective:  ? ?[name] is a 41 y.o. very pleasant female patient with Body mass index is 38.86 kg/m?. who presents with the following: ? ?R leg was feeling some prickly sensation.   This been ongoing for roughly 2 weeks.  She is not having any focal numbness, weakness.  She denies any back pain, hip pain, posterior leg pain, posterior thigh pain, buttocks pain. ? ?She does not have any history of any kind of major back pain, thigh, hip or pelvic trauma or fracture. ? ?This is predominantly only just inferior to the fibular head, and at the lower leg, predominantly in the upper lower leg, more laterally. ? ?No back pain. ? ?On Cellcept with ILD. ? ?Peroneal nerve entrapent ? ?Review of Systems is noted in the HPI, as appropriate ? ?Objective:  ? ?BP 100/72   Pulse 83   Temp 98.7 ?F (37.1 ?C) (Oral)   Ht 5\' 2"  (1.575 m)   Wt 212 lb 7 oz (96.4 kg)   LMP 01/15/2022   SpO2 99%   BMI 38.86 kg/m?  ? ?GEN: No acute distress; alert,appropriate. ?PULM: Breathing comfortably in no respiratory distress ?PSYCH: Normally interactive.  ? ?Full range of motion bilateral knees.  Full extension and flexion to 135.  Otherwise no joint tenderness, all ligamentous structures are  intact. ? ?Full range of motion at the ankles, toes, and strength is 5/5. ? ?Throughout the entirety of the lower extremities, hip, knee, foot and ankle the strength is 5/5. ? ?Sensation is intact to both pinprick and soft touch bilaterally. ? ?Globally, neurovascularly intact. ? ?Laboratory and Imaging Data: ? ?Assessment and Plan:  ? ?  ICD-10-CM   ?1. 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  ? ?Classic and history, presentation, examination.  We reviewed the anatomy involved, and I offered some reassurance.  No additional treatment aside from taking pressure off the nerve.  Avoid sitting in a manner that would put pressure on this, likely any form of compression would make this worse as well. ? ? ? ?Follow-up: No follow-ups on file. ? ?Dragon [employer] One speech-to-text software was used for transcription in this dictation.  Possible transcriptional errors can occur using Animal nutritionist.  ? ?Signed, ? ?Zahniya Zellars T. Kalliope Riesen, MD ? ? ?Outpatient Encounter Medications as of 02/05/2022  ?Medication Sig  ? azithromycin (ZITHROMAX) 250 MG tablet Take 250 mg by mouth daily.  ? budesonide-formoterol (SYMBICORT) 160-4.5 MCG/ACT inhaler Inhale into the lungs.  ? levothyroxine (SYNTHROID) 125 MCG tablet Take 1 tablet (125 mcg total) by mouth daily before breakfast. Except for 1.5 tabs on Sundays.  ? mycophenolate (CELLCEPT) 500 MG tablet Take 3 tablets (1,500 mg total) by mouth 2 (two) times daily.  ? ?No facility-administered encounter  medications on file as of 02/05/2022.  ?  ?

## 2022-09-15 ENCOUNTER — Other Ambulatory Visit: Payer: Self-pay | Admitting: Family Medicine

## 2022-09-15 DIAGNOSIS — E039 Hypothyroidism, unspecified: Secondary | ICD-10-CM

## 2022-09-16 NOTE — Telephone Encounter (Signed)
LVM for patient tcb and schedule 

## 2022-09-16 NOTE — Telephone Encounter (Signed)
Patient is overdue for CPE and labs; please call to schedule appt.

## 2022-11-22 ENCOUNTER — Other Ambulatory Visit: Payer: Self-pay | Admitting: Family Medicine

## 2022-11-22 DIAGNOSIS — E039 Hypothyroidism, unspecified: Secondary | ICD-10-CM

## 2022-11-24 ENCOUNTER — Other Ambulatory Visit: Payer: Self-pay | Admitting: Family Medicine

## 2022-11-24 DIAGNOSIS — E039 Hypothyroidism, unspecified: Secondary | ICD-10-CM

## 2022-11-24 NOTE — Telephone Encounter (Signed)
Spoke to pt, scheduled cpe for 11/28/22

## 2022-11-24 NOTE — Telephone Encounter (Signed)
Patient is overdue for CPE. Please call to schedule appt. 

## 2022-11-24 NOTE — Telephone Encounter (Signed)
Sent. Thanks.   

## 2022-11-24 NOTE — Telephone Encounter (Signed)
LOV - 09/06/21 NOV - not scheduled yet RF- 09/16/22 #98/0 Last lab - 09/06/21 and was 3.14

## 2022-11-25 ENCOUNTER — Other Ambulatory Visit (INDEPENDENT_AMBULATORY_CARE_PROVIDER_SITE_OTHER): Payer: 59

## 2022-11-25 DIAGNOSIS — E039 Hypothyroidism, unspecified: Secondary | ICD-10-CM | POA: Diagnosis not present

## 2022-11-25 LAB — TSH: TSH: 10 u[IU]/mL — ABNORMAL HIGH (ref 0.35–5.50)

## 2022-11-28 ENCOUNTER — Encounter: Payer: Self-pay | Admitting: Family Medicine

## 2022-11-28 ENCOUNTER — Ambulatory Visit (INDEPENDENT_AMBULATORY_CARE_PROVIDER_SITE_OTHER): Payer: 59 | Admitting: Family Medicine

## 2022-11-28 VITALS — BP 110/74 | HR 84 | Temp 97.9°F | Ht 62.0 in | Wt 214.0 lb

## 2022-11-28 DIAGNOSIS — Z0000 Encounter for general adult medical examination without abnormal findings: Secondary | ICD-10-CM

## 2022-11-28 DIAGNOSIS — Z7189 Other specified counseling: Secondary | ICD-10-CM

## 2022-11-28 DIAGNOSIS — E039 Hypothyroidism, unspecified: Secondary | ICD-10-CM

## 2022-11-28 DIAGNOSIS — J849 Interstitial pulmonary disease, unspecified: Secondary | ICD-10-CM

## 2022-11-28 MED ORDER — LEVOTHYROXINE SODIUM 125 MCG PO TABS: ORAL_TABLET | ORAL | 3 refills | Status: DC

## 2022-11-28 NOTE — Progress Notes (Unsigned)
CPE- See plan.  Routine anticipatory guidance given to patient.  See health maintenance.  The possibility exists that previously documented standard health maintenance information may have been brought forward from a previous encounter into this note.  If needed, that same information has been updated to reflect the current situation based on today's encounter.    Tetanus 2022 Flu due in the fall Covid vaccine prev done.   PNA prev done.  Shingles not due.   Living will d/w pt.  Would have her husband designated if patient were incapacitated.   Diet and exercise d/w pt.  Colon and breast cancer screening d/w pt.  Not due.  Pap done at gyn clinic.  per [facility] clinic.  05/01/21 HCV screening d/w pt.  Deferred, d/w pt. low risk.  Hypothyroidism.  D/w pt about dosing and labs.  No neck mass.  Fatigue noted.  Swallowing well.    ILD per pulmonary.  No flare in the last ~1 year.  Clearly improved form prev.  I'll defer.  She agrees.    D/w pt about defer lipid check at this point.  She agreed.    H/o iron def with prev infusions with improvement.    PMH and SH reviewed  Meds, vitals, and allergies reviewed.   ROS: Per HPI.  Unless specifically indicated otherwise in HPI, the patient denies:  General: fever. Eyes: acute vision changes ENT: sore throat Cardiovascular: chest pain Respiratory: SOB GI: vomiting GU: dysuria Musculoskeletal: acute back pain Derm: acute rash Neuro: acute motor dysfunction Psych: worsening mood Endocrine: polydipsia Heme: bleeding Allergy: hayfever  GEN: nad, alert and oriented HEENT: ncat NECK: supple w/o LA CV: rrr. PULM: ctab, no inc wob ABD: soft, +bs EXT: no edema SKIN: no acute rash

## 2022-11-28 NOTE — Patient Instructions (Signed)
Recheck TSH in about 3 months, here or at pulmonary.   Increase dose in the meantime on Sundays.  Take care.  Glad to see you.

## 2022-11-30 NOTE — Assessment & Plan Note (Signed)
Living will d/w pt. Would have her husband designated if patient were incapacitated. 

## 2022-11-30 NOTE — Assessment & Plan Note (Signed)
Per pulmonary.  I will defer.

## 2022-11-30 NOTE — Assessment & Plan Note (Signed)
Tetanus 2022 Flu due in the fall Covid vaccine prev done.   PNA prev done.  Shingles not due.   Living will d/w pt.  Would have her husband designated if patient were incapacitated.   Diet and exercise d/w pt.  Colon and breast cancer screening d/w pt.  Not due.  Pap done at gyn clinic.  per [facility] clinic.  05/01/21

## 2022-11-30 NOTE — Assessment & Plan Note (Signed)
Hypothyroidism.  D/w pt about dosing and labs.  No neck mass.  Fatigue noted.  Swallowing well.  Increase levothyroxine to 125 mcg daily except take 2 tablets on Sundays and recheck TSH in about 3 months.

## 2023-05-25 ENCOUNTER — Encounter: Payer: Self-pay | Admitting: Family Medicine

## 2023-05-25 ENCOUNTER — Ambulatory Visit (INDEPENDENT_AMBULATORY_CARE_PROVIDER_SITE_OTHER): Payer: 59 | Admitting: Family Medicine

## 2023-05-25 VITALS — BP 110/80 | HR 82 | Temp 98.0°F | Ht 62.0 in | Wt 216.0 lb

## 2023-05-25 DIAGNOSIS — R635 Abnormal weight gain: Secondary | ICD-10-CM | POA: Diagnosis not present

## 2023-05-25 DIAGNOSIS — Z9229 Personal history of other drug therapy: Secondary | ICD-10-CM

## 2023-05-25 DIAGNOSIS — F419 Anxiety disorder, unspecified: Secondary | ICD-10-CM | POA: Diagnosis not present

## 2023-05-25 DIAGNOSIS — E039 Hypothyroidism, unspecified: Secondary | ICD-10-CM

## 2023-05-25 DIAGNOSIS — Z1321 Encounter for screening for nutritional disorder: Secondary | ICD-10-CM

## 2023-05-25 DIAGNOSIS — Z862 Personal history of diseases of the blood and blood-forming organs and certain disorders involving the immune mechanism: Secondary | ICD-10-CM | POA: Insufficient documentation

## 2023-05-25 LAB — LIPID PANEL
Cholesterol: 186 mg/dL (ref 0–200)
HDL: 40 mg/dL (ref >39.00)
LDL Cholesterol: 112 mg/dL — ABNORMAL HIGH (ref 0–99)
NonHDL: 145.7
Total CHOL/HDL Ratio: 5
Triglycerides: 170 mg/dL — ABNORMAL HIGH (ref 0.0–149.0)
VLDL: 34 mg/dL (ref 0.0–40.0)

## 2023-05-25 LAB — CBC WITH DIFFERENTIAL/PLATELET
Basophils Absolute: 0 10*3/uL (ref 0.0–0.1)
Basophils Relative: 0.4 % (ref 0.0–3.0)
Eosinophils Absolute: 0.2 10*3/uL (ref 0.0–0.7)
Eosinophils Relative: 2.6 % (ref 0.0–5.0)
HCT: 40.2 % (ref 36.0–46.0)
Hemoglobin: 13 g/dL (ref 12.0–15.0)
Lymphocytes Relative: 18.6 % (ref 12.0–46.0)
Lymphs Abs: 1.3 10*3/uL (ref 0.7–4.0)
MCHC: 32.2 g/dL (ref 30.0–36.0)
MCV: 84.4 fl (ref 78.0–100.0)
Monocytes Absolute: 0.5 10*3/uL (ref 0.1–1.0)
Monocytes Relative: 6.8 % (ref 3.0–12.0)
Neutro Abs: 5.2 10*3/uL (ref 1.4–7.7)
Neutrophils Relative %: 71.6 % (ref 43.0–77.0)
Platelets: 297 10*3/uL (ref 150.0–400.0)
RBC: 4.77 Mil/uL (ref 3.87–5.11)
RDW: 14.1 % (ref 11.5–15.5)
WBC: 7.2 10*3/uL (ref 4.0–10.5)

## 2023-05-25 LAB — COMPREHENSIVE METABOLIC PANEL
ALT: 7 U/L (ref 0–35)
AST: 11 U/L (ref 0–37)
Albumin: 4.3 g/dL (ref 3.5–5.2)
Alkaline Phosphatase: 59 U/L (ref 39–117)
BUN: 8 mg/dL (ref 6–23)
CO2: 26 mEq/L (ref 19–32)
Calcium: 9.4 mg/dL (ref 8.4–10.5)
Chloride: 104 mEq/L (ref 96–112)
Creatinine, Ser: 0.7 mg/dL (ref 0.40–1.20)
GFR: 108.39 mL/min (ref >60.00)
Glucose, Bld: 86 mg/dL (ref 70–99)
Potassium: 4.3 mEq/L (ref 3.5–5.1)
Sodium: 140 mEq/L (ref 135–145)
Total Bilirubin: 0.4 mg/dL (ref 0.2–1.2)
Total Protein: 7.1 g/dL (ref 6.0–8.3)

## 2023-05-25 LAB — FERRITIN: Ferritin: 15 ng/mL (ref 10.0–291.0)

## 2023-05-25 LAB — VITAMIN D 25 HYDROXY (VIT D DEFICIENCY, FRACTURES): VITD: 18.74 ng/mL — ABNORMAL LOW (ref 30.00–100.00)

## 2023-05-25 LAB — TSH: TSH: 2.54 u[IU]/mL (ref 0.35–5.50)

## 2023-05-25 LAB — VITAMIN B12: Vitamin B-12: 83 pg/mL — ABNORMAL LOW (ref 211–911)

## 2023-05-25 LAB — IRON: Iron: 43 ug/dL (ref 42–145)

## 2023-05-25 MED ORDER — SERTRALINE HCL 50 MG PO TABS: 50.0000 mg | ORAL_TABLET | Freq: Every day | ORAL | 1 refills | Status: DC

## 2023-05-25 MED ORDER — MYCOPHENOLATE MOFETIL 500 MG PO TABS: ORAL_TABLET | ORAL | Status: DC

## 2023-05-25 NOTE — Progress Notes (Signed)
Anxiety.  D/w pt about pulmonary status.  Prev was on zoloft.  No SI/HI.  She is irritable and has trouble relaxing.  Discussed options.  Taking melatonin to try to help with sleep but still waking at night.  See below.    Weight gain.  D/w pt about options.  Not on prednisone currently.  Discussed weight gain and pulmonary status.  She is working on diet and exercise.  Daughter is 42 and has anorexia and that affects her situation.  Eating at home.  D/w pt about meal planning.  Working a Office manager.    She has pulmonary f/u pending.  No cough, no wheeze, no sputum.  S/p prednisone, not on currently.    H/o anemia. Recheck labs pending.  H/o hypothyroidism.  levothyroxine, 8 tabs per week.    Meds, vitals, and allergies reviewed.   ROS: Per HPI unless specifically indicated in ROS section   GEN: nad, alert and oriented HEENT: ncat NECK: supple w/o LA CV: rrr.  PULM: ctab, no inc wob ABD: soft, +bs EXT: no edema SKIN: well perfused.   25 minutes were devoted to patient care in this encounter (this includes time spent reviewing the patient's file/history, interviewing and examining the patient, counseling/reviewing plan with patient).

## 2023-05-25 NOTE — Assessment & Plan Note (Signed)
Would try to address anxiety and sleep prior to working on weight reduction interventions.  Discussed ozempic and similar with routine cautions but would defer starting that med now.  See notes on labs.

## 2023-05-25 NOTE — Assessment & Plan Note (Signed)
H/o prednisone use.  Could get DXA done at some point but would check vit D first.  D/w pt, she agrees.  See notes on labs.

## 2023-05-25 NOTE — Assessment & Plan Note (Signed)
Continue levothyroxine.  See notes on labs. 

## 2023-05-25 NOTE — Assessment & Plan Note (Signed)
See notes on labs. 

## 2023-05-25 NOTE — Patient Instructions (Signed)
Restart zoloft.  Update [place] in about 2-4 weeks, sooner if needed.  Go to the lab on the way out.   If you have mychart we'll likely use that to update you.    Take care.  Glad to see you.

## 2023-05-25 NOTE — Assessment & Plan Note (Signed)
H/o lung disease, daughter with anorexia, job stressors.   Discussed options.  Recheck labs today.  Start zoloft 50mg  with routine cautions.  See AVS.  Okay for outpatient f/u.   Would try to address anxiety and sleep prior to working on weight reduction interventions.

## 2023-05-27 ENCOUNTER — Other Ambulatory Visit: Payer: Self-pay | Admitting: Family Medicine

## 2023-05-27 ENCOUNTER — Encounter: Payer: Self-pay | Admitting: Family Medicine

## 2023-05-27 DIAGNOSIS — E559 Vitamin D deficiency, unspecified: Secondary | ICD-10-CM | POA: Insufficient documentation

## 2023-05-27 DIAGNOSIS — E538 Deficiency of other specified B group vitamins: Secondary | ICD-10-CM

## 2023-05-27 MED ORDER — CYANOCOBALAMIN 1000 MCG/ML IJ SOLN: INTRAMUSCULAR | Status: DC

## 2023-05-27 MED ORDER — VITAMIN D (ERGOCALCIFEROL) 1.25 MG (50000 UNIT) PO CAPS: 50000.0000 [IU] | ORAL_CAPSULE | ORAL | 0 refills | Status: DC

## 2023-05-28 NOTE — Addendum Note (Signed)
Addended by: Alvina Chou on: 05/28/2023 07:31 AM   Modules accepted: Orders

## 2023-06-02 ENCOUNTER — Ambulatory Visit (INDEPENDENT_AMBULATORY_CARE_PROVIDER_SITE_OTHER): Payer: 59

## 2023-06-02 DIAGNOSIS — E538 Deficiency of other specified B group vitamins: Secondary | ICD-10-CM | POA: Diagnosis not present

## 2023-06-02 MED ORDER — CYANOCOBALAMIN 1000 MCG/ML IJ SOLN
1000.0000 ug | Freq: Once | INTRAMUSCULAR | Status: AC
Start: 2023-06-02 — End: 2023-06-02
  Administered 2023-06-02: 1000 ug via INTRAMUSCULAR

## 2023-06-02 NOTE — Progress Notes (Signed)
Per orders of Dr. Crawford Givens, injection of B-12 given by Donnamarie Poag in left deltoid. Patient tolerated injection well. Patient will make appointment for 1 week.

## 2023-06-10 ENCOUNTER — Ambulatory Visit (INDEPENDENT_AMBULATORY_CARE_PROVIDER_SITE_OTHER): Payer: 59

## 2023-06-10 DIAGNOSIS — E538 Deficiency of other specified B group vitamins: Secondary | ICD-10-CM | POA: Diagnosis not present

## 2023-06-10 MED ORDER — CYANOCOBALAMIN 1000 MCG/ML IJ SOLN
1000.0000 ug | Freq: Once | INTRAMUSCULAR | Status: AC
  Administered 2023-06-10: 1000 ug via INTRAMUSCULAR

## 2023-06-10 NOTE — Progress Notes (Signed)
Per orders of Dr. Loleta Books is out of office and Dr Eustaquio Boyden who is in office injection of vitamin b 12 given by Lewanda Rife in right deltoid.Patient tolerated injection well. Patient will make appointment for 1 week.

## 2023-06-16 ENCOUNTER — Ambulatory Visit (INDEPENDENT_AMBULATORY_CARE_PROVIDER_SITE_OTHER): Payer: 59

## 2023-06-16 DIAGNOSIS — E538 Deficiency of other specified B group vitamins: Secondary | ICD-10-CM

## 2023-06-16 MED ORDER — CYANOCOBALAMIN 1000 MCG/ML IJ SOLN
1000.0000 ug | Freq: Once | INTRAMUSCULAR | Status: AC
  Administered 2023-06-16: 1000 ug via INTRAMUSCULAR

## 2023-06-16 NOTE — Progress Notes (Signed)
Per orders of Dr. Crawford Givens, injection of vitamin b12  given by Lewanda Rife in left deltoid. Patient tolerated injection well. Patient will make appointment for 1 week.

## 2023-07-02 ENCOUNTER — Ambulatory Visit (INDEPENDENT_AMBULATORY_CARE_PROVIDER_SITE_OTHER): Payer: 59

## 2023-07-02 DIAGNOSIS — E538 Deficiency of other specified B group vitamins: Secondary | ICD-10-CM | POA: Diagnosis not present

## 2023-07-02 MED ORDER — CYANOCOBALAMIN 1000 MCG/ML IJ SOLN
1000.0000 ug | Freq: Once | INTRAMUSCULAR | Status: AC
  Administered 2023-07-02: 1000 ug via INTRAMUSCULAR

## 2023-07-02 NOTE — Progress Notes (Signed)
Per orders of Dr. Graham Duncan, injection of vitamin b 12 given by Rena Isley in right deltoid. Patient tolerated injection well. Patient will make appointment for 1 month.   

## 2023-07-14 ENCOUNTER — Encounter: Payer: Self-pay | Admitting: Family Medicine

## 2023-07-15 ENCOUNTER — Other Ambulatory Visit: Payer: Self-pay | Admitting: Family Medicine

## 2023-07-15 MED ORDER — SERTRALINE HCL 50 MG PO TABS: 75.0000 mg | ORAL_TABLET | Freq: Every day | ORAL | 1 refills | Status: DC

## 2023-07-21 ENCOUNTER — Other Ambulatory Visit: Payer: Self-pay | Admitting: Family Medicine

## 2023-07-21 DIAGNOSIS — E039 Hypothyroidism, unspecified: Secondary | ICD-10-CM

## 2023-08-11 ENCOUNTER — Encounter: Payer: Self-pay | Admitting: Family Medicine

## 2023-08-11 ENCOUNTER — Ambulatory Visit (INDEPENDENT_AMBULATORY_CARE_PROVIDER_SITE_OTHER): Payer: 59 | Admitting: Family Medicine

## 2023-08-11 VITALS — BP 116/70 | HR 80 | Temp 98.4°F | Ht 62.0 in | Wt 216.2 lb

## 2023-08-11 DIAGNOSIS — J019 Acute sinusitis, unspecified: Secondary | ICD-10-CM

## 2023-08-11 DIAGNOSIS — E538 Deficiency of other specified B group vitamins: Secondary | ICD-10-CM | POA: Diagnosis not present

## 2023-08-11 DIAGNOSIS — E559 Vitamin D deficiency, unspecified: Secondary | ICD-10-CM | POA: Diagnosis not present

## 2023-08-11 DIAGNOSIS — E039 Hypothyroidism, unspecified: Secondary | ICD-10-CM | POA: Diagnosis not present

## 2023-08-11 LAB — VITAMIN D 25 HYDROXY (VIT D DEFICIENCY, FRACTURES): VITD: 18.4 ng/mL — ABNORMAL LOW (ref 30.00–100.00)

## 2023-08-11 LAB — TSH: TSH: 11.73 u[IU]/mL — ABNORMAL HIGH (ref 0.35–5.50)

## 2023-08-11 LAB — VITAMIN B12: Vitamin B-12: 186 pg/mL — ABNORMAL LOW (ref 211–911)

## 2023-08-11 MED ORDER — AMOXICILLIN-POT CLAVULANATE 875-125 MG PO TABS: 1.0000 | ORAL_TABLET | Freq: Two times a day (BID) | ORAL | 0 refills | Status: DC

## 2023-08-11 NOTE — Patient Instructions (Signed)
Hold azithromycin while one augmentin.  Update [place] if not better.  Take care.  Glad to see you.

## 2023-08-11 NOTE — Progress Notes (Unsigned)
Current Outpatient Medications on File Prior to Visit  Medication Sig Dispense Refill   azithromycin (ZITHROMAX) 250 MG tablet Take 250 mg by mouth daily.     budesonide-formoterol (SYMBICORT) 160-4.5 MCG/ACT inhaler Inhale 2 puffs into the lungs in the morning and at bedtime.     cyanocobalamin (VITAMIN B12) 1000 MCG/ML injection 1000 mcg injected IM weekly x 4 doses then monthly thereafter     levothyroxine (SYNTHROID) 125 MCG tablet TAKE 1 TABLET BY MOUTH DAILY  BEFORE BREAKFAST EXCEPT FOR SUNDAYS TAKE 2 TABLETS  BY MOUTH 110 tablet 3   mycophenolate (CELLCEPT) 500 MG tablet 3 tabs in the AM and 2 in the PM     sertraline (ZOLOFT) 50 MG tablet Take 1.5 tablets (75 mg total) by mouth daily. 135 tablet 1   No current facility-administered medications on file prior to visit.   Still on meds above.    Sx for a few weeks.  Her kids had a cold, they got better.  ST.  Sinus pressure, frontal.  No LA noted but neck is sore (inferior to the ears near the upper SCM).  Still on zmax.  No fevers.  B ear pressure.  Prev rhinorrhea, now with post nasal gtt. No vomiting, no diarrhea.  Prev cough resolved.  No wheeze.  Some SOB with exertion.    Due for repeat vitamin D, vitamin B12, TSH.  See notes on labs. She feels better with B12 replacement.  Meds, vitals, and allergies reviewed.   ROS: Per HPI unless specifically indicated in ROS section   GEN: nad, alert and oriented HEENT: mucous membranes moist, tm w/o erythema, nasal exam w/o erythema, clear discharge noted,  OP with mild cobblestoning, frontal sinuses ttp NECK: supple w/o LA CV: rrr.   PULM: ctab, no inc wob EXT: no edema SKIN: well perfused.

## 2023-08-12 DIAGNOSIS — J019 Acute sinusitis, unspecified: Secondary | ICD-10-CM | POA: Insufficient documentation

## 2023-08-12 NOTE — Assessment & Plan Note (Addendum)
Presumed sinusitis.  Still okay for outpatient follow-up.  Lungs clear.  Discussed options.  Start Augmentin. Hold azithromycin while one augmentin.  Update [place] if not better.  Supportive care in the meantime.  She agrees to plan.  Reasonable to defer lung imaging at this point.

## 2023-08-17 ENCOUNTER — Other Ambulatory Visit: Payer: Self-pay | Admitting: Family Medicine

## 2023-08-17 DIAGNOSIS — E538 Deficiency of other specified B group vitamins: Secondary | ICD-10-CM

## 2023-08-17 DIAGNOSIS — E039 Hypothyroidism, unspecified: Secondary | ICD-10-CM

## 2023-08-17 DIAGNOSIS — E559 Vitamin D deficiency, unspecified: Secondary | ICD-10-CM

## 2023-08-17 MED ORDER — CYANOCOBALAMIN 1000 MCG/ML IJ SOLN: INTRAMUSCULAR | Status: AC

## 2023-08-17 MED ORDER — LEVOTHYROXINE SODIUM 150 MCG PO TABS: ORAL_TABLET | ORAL | 3 refills | Status: DC

## 2023-08-17 MED ORDER — VITAMIN D (ERGOCALCIFEROL) 1.25 MG (50000 UNIT) PO CAPS: 50000.0000 [IU] | ORAL_CAPSULE | ORAL | 0 refills | Status: AC

## 2023-08-20 ENCOUNTER — Ambulatory Visit (INDEPENDENT_AMBULATORY_CARE_PROVIDER_SITE_OTHER): Payer: 59

## 2023-08-20 DIAGNOSIS — E538 Deficiency of other specified B group vitamins: Secondary | ICD-10-CM

## 2023-08-20 MED ORDER — CYANOCOBALAMIN 1000 MCG/ML IJ SOLN
1000.0000 ug | Freq: Once | INTRAMUSCULAR | Status: AC
  Administered 2023-08-20: 1000 ug via INTRAMUSCULAR

## 2023-08-20 NOTE — Progress Notes (Signed)
Patient presented for B 12 injection given by Rhylan Kagel, CMA to left deltoid, patient voiced no concerns nor showed any signs of distress during injection.  

## 2023-08-28 ENCOUNTER — Ambulatory Visit (INDEPENDENT_AMBULATORY_CARE_PROVIDER_SITE_OTHER): Payer: 59

## 2023-08-28 ENCOUNTER — Other Ambulatory Visit: Payer: 59

## 2023-08-28 DIAGNOSIS — E538 Deficiency of other specified B group vitamins: Secondary | ICD-10-CM

## 2023-08-28 MED ORDER — CYANOCOBALAMIN 1000 MCG/ML IJ SOLN
1000.0000 ug | Freq: Once | INTRAMUSCULAR | Status: AC
  Administered 2023-08-28: 1000 ug via INTRAMUSCULAR

## 2023-08-28 NOTE — Progress Notes (Signed)
Per orders of Dr. Crawford Givens, injection of vitamin b 12 given by Lewanda Rife in right deltoid. Patient tolerated injection well. Patient will make appointment for 1 week.

## 2023-11-16 ENCOUNTER — Other Ambulatory Visit: Payer: 59

## 2024-01-03 ENCOUNTER — Other Ambulatory Visit: Payer: Self-pay | Admitting: Family Medicine

## 2024-01-04 NOTE — Telephone Encounter (Signed)
 Patient is due for CPE, please schedule and route back for refill. Thanks!

## 2024-01-04 NOTE — Telephone Encounter (Signed)
 Lvmtcb, sent mychart message

## 2024-01-05 NOTE — Telephone Encounter (Signed)
 LVM for patient to c/b and schedule.

## 2024-01-06 ENCOUNTER — Other Ambulatory Visit: Payer: Self-pay

## 2024-01-06 MED ORDER — SERTRALINE HCL 50 MG PO TABS: 75.0000 mg | ORAL_TABLET | Freq: Every day | ORAL | 0 refills | Status: DC

## 2024-01-06 NOTE — Telephone Encounter (Signed)
 Called  pt and schedule a appt for cpe / labs

## 2024-01-08 ENCOUNTER — Other Ambulatory Visit (INDEPENDENT_AMBULATORY_CARE_PROVIDER_SITE_OTHER)

## 2024-01-08 DIAGNOSIS — E538 Deficiency of other specified B group vitamins: Secondary | ICD-10-CM

## 2024-01-08 DIAGNOSIS — E039 Hypothyroidism, unspecified: Secondary | ICD-10-CM | POA: Diagnosis not present

## 2024-01-08 DIAGNOSIS — E559 Vitamin D deficiency, unspecified: Secondary | ICD-10-CM | POA: Diagnosis not present

## 2024-01-08 LAB — VITAMIN D 25 HYDROXY (VIT D DEFICIENCY, FRACTURES): VITD: 15.61 ng/mL — ABNORMAL LOW (ref 30.00–100.00)

## 2024-01-08 LAB — TSH: TSH: 3.04 u[IU]/mL (ref 0.35–5.50)

## 2024-01-08 LAB — VITAMIN B12: Vitamin B-12: 135 pg/mL — ABNORMAL LOW (ref 211–911)

## 2024-01-15 ENCOUNTER — Other Ambulatory Visit: Payer: Self-pay

## 2024-01-15 ENCOUNTER — Emergency Department

## 2024-01-15 ENCOUNTER — Ambulatory Visit: Admitting: Family Medicine

## 2024-01-15 ENCOUNTER — Encounter: Payer: Self-pay | Admitting: Family Medicine

## 2024-01-15 ENCOUNTER — Emergency Department
Admission: EM | Admit: 2024-01-15 | Discharge: 2024-01-15 | Disposition: A | Discharge status: Home / Self Care | Attending: Emergency Medicine | Admitting: Emergency Medicine

## 2024-01-15 VITALS — BP 118/84 | HR 84 | Temp 98.0°F | Ht 62.75 in | Wt 221.0 lb

## 2024-01-15 DIAGNOSIS — R072 Precordial pain: Secondary | ICD-10-CM | POA: Insufficient documentation

## 2024-01-15 DIAGNOSIS — R079 Chest pain, unspecified: Secondary | ICD-10-CM

## 2024-01-15 LAB — BASIC METABOLIC PANEL WITH GFR
Anion gap: 7 (ref 5–15)
BUN: 10 mg/dL (ref 6–20)
CO2: 26 mmol/L (ref 22–32)
Calcium: 8.9 mg/dL (ref 8.9–10.3)
Chloride: 105 mmol/L (ref 98–111)
Creatinine, Ser: 0.64 mg/dL (ref 0.44–1.00)
GFR, Estimated: >60 mL/min (ref >60)
Glucose, Bld: 100 mg/dL — ABNORMAL HIGH (ref 70–99)
Potassium: 4.2 mmol/L (ref 3.5–5.1)
Sodium: 138 mmol/L (ref 135–145)

## 2024-01-15 LAB — CBC
HCT: 35.5 % — ABNORMAL LOW (ref 36.0–46.0)
Hemoglobin: 11.2 g/dL — ABNORMAL LOW (ref 12.0–15.0)
MCH: 24.9 pg — ABNORMAL LOW (ref 26.0–34.0)
MCHC: 31.5 g/dL (ref 30.0–36.0)
MCV: 79.1 fL — ABNORMAL LOW (ref 80.0–100.0)
Platelets: 351 10*3/uL (ref 150–400)
RBC: 4.49 MIL/uL (ref 3.87–5.11)
RDW: 13.6 % (ref 11.5–15.5)
WBC: 9.7 10*3/uL (ref 4.0–10.5)
nRBC: 0 % (ref 0.0–0.2)

## 2024-01-15 LAB — TROPONIN I (HIGH SENSITIVITY)
Troponin I (High Sensitivity): 3 ng/L (ref <18)
Troponin I (High Sensitivity): <2 ng/L (ref <18)

## 2024-01-15 LAB — POC URINE PREG, ED: Preg Test, Ur: NEGATIVE

## 2024-01-15 MED ORDER — MYCOPHENOLATE MOFETIL 500 MG PO TABS: ORAL_TABLET | ORAL | Status: AC

## 2024-01-15 MED ORDER — IOHEXOL 350 MG/ML SOLN
100.0000 mL | Freq: Once | INTRAVENOUS | Status: AC | PRN
  Administered 2024-01-15: 100 mL via INTRAVENOUS

## 2024-01-15 MED ORDER — NITROGLYCERIN 0.4 MG SL SUBL
0.4000 mg | SUBLINGUAL_TABLET | Freq: Once | SUBLINGUAL | Status: AC
Start: 2024-01-15 — End: 2024-01-15
  Administered 2024-01-15: 0.4 mg via SUBLINGUAL

## 2024-01-15 NOTE — ED Provider Notes (Signed)
 [facility] Provider Note    Event Date/Time   First MD Initiated Contact with Patient 01/15/24 1733     (approximate)   History   Chief Complaint Chest Pain   HPI  [name] is a 43 y.o. female with past medical history of migraines, interstitial lung disease, and eosinophilic pneumonia who presents to the ED complaining of chest pain.  Patient reports that she had sudden onset of sharp pain in the center of her chest a couple of hours prior to arrival.  She states that the pain is worse when she takes a deep breath, but she denies any fevers, cough, or shortness of breath.  She has not had any pain or swelling in her legs, denies any history of DVT/PE.     Physical Exam   Triage Vital Signs: ED Triage Vitals  Encounter Vitals Group     BP 01/15/24 1637 114/75     Systolic BP Percentile --      Diastolic BP Percentile --      Pulse Rate 01/15/24 1637 81     Resp 01/15/24 1637 16     Temp 01/15/24 1637 97.8 F (36.6 C)     Temp Source 01/15/24 1637 Oral     SpO2 01/15/24 1637 96 %     Weight 01/15/24 1606 221 lb (100.2 kg)     Height 01/15/24 1606 5\' 2"  (1.575 m)     Head Circumference --      Peak Flow --      Pain Score 01/15/24 1607 8     Pain Loc --      Pain Education --      Exclude from Growth Chart --     Most recent vital signs: Vitals:   01/15/24 2035 01/15/24 2100  BP:  95/70  Pulse: 69 63  Resp: 16 15  Temp:    SpO2: 100% 100%    Constitutional: Alert and oriented. Eyes: Conjunctivae are normal. Head: Atraumatic. Nose: No congestion/rhinnorhea. Mouth/Throat: Mucous membranes are moist.  Cardiovascular: Normal rate, regular rhythm. Grossly normal heart sounds.  2+ radial pulses bilaterally. Respiratory: Normal respiratory effort.  No retractions. Lungs CTAB.  No chest wall tenderness to palpation noted. Gastrointestinal: Soft and nontender. No distention. Musculoskeletal: No lower extremity tenderness nor  edema.  Neurologic:  Normal speech and language. No gross focal neurologic deficits are appreciated.    ED Results / Procedures / Treatments   Labs (all labs ordered are listed, but only abnormal results are displayed) Labs Reviewed  BASIC METABOLIC PANEL WITH GFR - Abnormal; Notable for the following components:      Result Value   Glucose, Bld 100 (*)    All other components within normal limits  CBC - Abnormal; Notable for the following components:   Hemoglobin 11.2 (*)    HCT 35.5 (*)    MCV 79.1 (*)    MCH 24.9 (*)    All other components within normal limits  POC URINE PREG, ED  TROPONIN I (HIGH SENSITIVITY)  TROPONIN I (HIGH SENSITIVITY)     EKG  ED ECG REPORT I, Chesley Noon, the attending physician, personally viewed and interpreted this ECG.   Date: 01/15/2024  EKG Time: 16:40  Rate: 81  Rhythm: normal sinus rhythm  Axis: Normal  Intervals:none  ST&T Change: None  RADIOLOGY Chest x-ray reviewed and interpreted by me with no infiltrate, edema, or effusion.  PROCEDURES:  Critical Care performed: No  Procedures  MEDICATIONS ORDERED IN ED: Medications  iohexol (OMNIPAQUE) 350 MG/ML injection 100 mL (100 mLs Intravenous Contrast Given 01/15/24 2045)     IMPRESSION / MDM / ASSESSMENT AND PLAN / ED COURSE  I reviewed the triage vital signs and the nursing notes.                              43 y.o. female with past medical history of migraines, interstitial lung disease, and eosinophilic pneumonia who presents to the ED complaining of sudden onset sharp chest pain that is worse when a deep breath.  Patient's presentation is most consistent with acute presentation with potential threat to life or bodily function.  Differential diagnosis includes, but is not limited to, ACS, PE, pneumonia, pneumothorax, musculoskeletal pain, GERD, anxiety.  Patient nontoxic-appearing and in no acute distress, vital signs are unremarkable.  EKG shows no evidence  arrhythmia or ischemia and initial troponin within normal limits.  Given acute onset, will check second set troponin, also check CTA chest to rule out PE.  Additional labs are reassuring with no significant anemia, leukocytosis, electrolyte abnormality, or AKI.  CTA chest is negative for PE, does have some groundglass infiltrate in the posterior right lower lobe, likely related to atelectasis per radiology.  Patient does not have any symptoms concerning for pneumonia and we will hold off on antibiotics at this time.  She is appropriate for discharge home with follow-up with her pulmonologist, was counseled to return to the ED for new or worsening symptoms.  Patient agrees with plan.      FINAL CLINICAL IMPRESSION(S) / ED DIAGNOSES   Final diagnoses:  Nonspecific chest pain     Rx / DC Orders   ED Discharge Orders     None        Note:  This document was prepared using Dragon voice recognition software and may include unintentional dictation errors.   Chesley Noon, MD 01/15/24 2138

## 2024-01-15 NOTE — Progress Notes (Signed)
 2:50 PM: 2ml Maalox. Chest pain did not subside.  2:56 PM: Pt given nitroglycerin with directions to place under tongue to dissolve.  2:57:30 BP 118/84 HR 88  2:58 pm Head feels weird 2 mins after nitroglycerin. Chest pain not any better  3:05 PM place on 2L oxygen

## 2024-01-15 NOTE — Progress Notes (Signed)
 Vit D and B12 low, d/w pt.  TSH wnl.   Management of labs deferred. CPE deferred.  She agreed.  Higher prior to issue-CP at the sternum.  Quick onset.  No sx like this prev.  Started today, just prior to arrival.  Pain in R shoulder and cental/mid back.  Pain with a deep breath and with exhalation.  No cough, no wheeze.  No FCNAVD.  No rash.  She doesn't typically have heartburn.  8/10 pain.   Chest wall not ttp.   Meds, vitals, and allergies reviewed.   ROS: Per HPI unless specifically indicated in ROS section   No apparent respiratory distress but uncomfortable.  Ncat Neck supple No LA Rrr Ctab Abd soft Back and chest not ttp Skin well perfused.   EKG w/o acute changes.  D/w pt.   No change with maalox at 2:49 PM.   D/w pt about options.  Consented for EMS transport.  NTG given x1 dose.  No change in chest pain.  She did have headache with use.  42 minutes were devoted to patient care in this encounter (this includes time spent reviewing the patient's file/history, interviewing and examining the patient, counseling/reviewing plan with patient).

## 2024-01-15 NOTE — ED Triage Notes (Addendum)
 Pt come via [facility] from [facility] with c/o cp that started today at 1330. Pt states this started substernal radiates to back and chest. Pt has hx of lung disease and right lower lung has been removed.   Pt went to UC and they called 911  Pt given 324 aspirin, 1 nitroglycerin and gerd medicine.

## 2024-01-15 NOTE — Assessment & Plan Note (Addendum)
 Unclear source.  Acute onset, no change with maalox.  NTG given x1- no change in CP with use.  No sig change on EKG.  Ddx d/w pt EMS in route.   ---------- Recheck with escalating CP, no change in sx with 2L O2.  EMS arrived, to ER.  She needs ER evaluation for likely CT.  On CellCept at baseline.

## 2024-03-20 ENCOUNTER — Ambulatory Visit
Admission: RE | Admit: 2024-03-20 | Discharge: 2024-03-20 | Discharge status: Home / Self Care | Source: Ambulatory Visit | Attending: Family Medicine

## 2024-03-20 VITALS — BP 104/79 | HR 86 | Temp 98.8°F | Resp 16

## 2024-03-20 DIAGNOSIS — N390 Urinary tract infection, site not specified: Secondary | ICD-10-CM | POA: Diagnosis not present

## 2024-03-20 LAB — URINALYSIS, W/ REFLEX TO CULTURE (INFECTION SUSPECTED)
Bilirubin Urine: NEGATIVE
Glucose, UA: NEGATIVE mg/dL
Ketones, ur: NEGATIVE mg/dL
Nitrite: NEGATIVE
Specific Gravity, Urine: >1.03 — ABNORMAL HIGH (ref 1.005–1.030)
WBC, UA: >50 WBC/hpf (ref 0–5)
pH: 5 (ref 5.0–8.0)

## 2024-03-20 MED ORDER — PHENAZOPYRIDINE HCL 200 MG PO TABS: 200.0000 mg | ORAL_TABLET | Freq: Three times a day (TID) | ORAL | 0 refills | Status: DC

## 2024-03-20 MED ORDER — SULFAMETHOXAZOLE-TRIMETHOPRIM 800-160 MG PO TABS: 1.0000 | ORAL_TABLET | Freq: Two times a day (BID) | ORAL | 0 refills | Status: AC

## 2024-03-20 NOTE — ED Provider Notes (Signed)
 [facility]    CSN: [id number] Arrival date & time: 03/20/24  0940      History   Chief Complaint Chief Complaint  Patient presents with   Urinary Frequency    Entered by patient    HPI [name] is a 43 y.o. female.   HPI  43 year old female with past medical history significant for IDA, asthma, Bowen's disease, eosinophilic pneumonia, endometriosis, GERD, migraine headaches, hypothyroidism, UTIs, and interstitial lung disease presents for evaluation of an incomplete emptying of her bladder that has been going on for the past 5 days.  She does endorse urinary frequency but she denies any dysuria or urgency.  Also no fever, nausea, or vomiting.  She is experiencing lower abdominal/suprapubic cramping.  She has chronic low back pain but has not noticed any increases over her baseline.  She states that this feels similar to an episode that occurred when she was in her 7s that required catheterization and bladder irrigation.  Past Medical History:  Diagnosis Date   Anemia    Iron Deficiency in the past   Asthma    in childhood   Bowen's disease    Complication of anesthesia    HARD TO WAKE UP SOMETIIMES   Dyspnea 04/2019   sob and coughing has increased of late   Endometriosis    dx'd in high school   Eosinophilic pneumonia (HCC) ~08/2007   per Dr. Verneta Gone s/p VATS and 1 year of prednisone    GERD (gastroesophageal reflux disease)    TUMS PRN   Headache    MIGRAINES without aura,seldom   History of abnormal Pap smear    History of chicken pox    Hypothyroidism    dx'd 2011   Pneumonia 2009/ 2020   EOSINOPHILIC PNEUMONIA   PONV (postoperative nausea and vomiting)    x1   Seizures (HCC)    childhood-previously on Tegretol-been off meds for years with no seizures   Urinary tract bacterial infections     Patient Active Problem List   Diagnosis Date Noted   Chest pain 01/15/2024   Acute non-recurrent sinusitis 08/12/2023   Vitamin D   deficiency 05/27/2023   Vitamin B12 deficiency 05/27/2023   Anxiety 05/25/2023   Abnormal weight gain 05/25/2023   History of high risk medication treatment 05/25/2023   History of anemia 05/25/2023   S/P Robotic Assisted Right side lung biopsy (wedge RUL, RML, RLL) 01/10/2020   Interstitial lung disease (HCC) 01/09/2020   Routine general medical examination at a health care facility 05/08/2019   Lower respiratory infection 01/26/2019   Lumbar radicular pain 12/21/2017   Advance care planning 05/04/2017   Calculus of gallbladder without cholecystitis without obstruction 08/05/2016   Migraine without aura 04/18/2012   Cough 02/06/2012   Eosinophilic pneumonia (HCC) 02/07/2011   Hypothyroidism 02/07/2011    Past Surgical History:  Procedure Laterality Date   CESAREAN SECTION  2007   CHOLECYSTECTOMY N/A 10/27/2016   Procedure: LAPAROSCOPIC CHOLECYSTECTOMY;  Surgeon: Alben Alma, MD;  Location: [facility] ORS;  Service: General;  Laterality: N/A;   DIAGNOSTIC LAPAROSCOPY  2018   X2   FLEXIBLE BRONCHOSCOPY N/A 05/03/2019   Procedure: FLEXIBLE BRONCHOSCOPY;  Surgeon: Cordie Deters, MD;  Location: [facility] ORS;  Service: Pulmonary;  Laterality: N/A;   LUNG BIOPSY Right 2008   partial right lung resection.  was treated with steroids x 1 year   TONSILLECTOMY AND ADENOIDECTOMY     VIDEO BRONCHOSCOPY N/A 01/09/2020   Procedure: VIDEO BRONCHOSCOPY;  Surgeon: Norita Beauvais, MD;  Location: [facility];  Service: Thoracic;  Laterality: N/A;    OB History   No obstetric history on file.      Home Medications    Prior to Admission medications   Medication Sig Start Date End Date Taking? Authorizing Provider  phenazopyridine (PYRIDIUM) 200 MG tablet Take 1 tablet (200 mg total) by mouth 3 (three) times daily. 03/20/24  Yes Kent Pear, NP  sulfamethoxazole -trimethoprim  (BACTRIM  DS) 800-160 MG tablet Take 1 tablet by mouth 2 (two) times daily for 5 days. 03/20/24 03/25/24 Yes Kent Pear, NP   budesonide-formoterol [facility]) 160-4.5 MCG/ACT inhaler Inhale 2 puffs into the lungs in the morning and at bedtime. 03/05/20   [provider]  cyanocobalamin  (VITAMIN B12) 1000 MCG/ML injection 1000 mcg injected IM weekly x 4 doses then monthly thereafter Patient not taking: Reported on 01/15/2024 08/17/23   Donnie Galea, MD  levothyroxine  (SYNTHROID ) 150 MCG tablet TAKE 1 TABLET BY MOUTH DAILY  BEFORE BREAKFAST 08/17/23   Donnie Galea, MD  mycophenolate  (CELLCEPT ) 500 MG tablet 2 tabs in the AM and 2 in the PM 01/15/24   Donnie Galea, MD  sertraline  (ZOLOFT ) 50 MG tablet Take 1.5 tablets (75 mg total) by mouth daily. 01/06/24   Donnie Galea, MD  Vitamin D , Ergocalciferol , (DRISDOL ) 1.25 MG (50000 UNIT) CAPS capsule Take 1 capsule (50,000 Units total) by mouth 2 (two) times a week. 08/17/23   Donnie Galea, MD    Family History Family History  Problem Relation Age of Onset   Mental illness Mother        bipolar   Cancer Father        h/o throat cancer- treated   Heart disease Maternal Grandfather        at ate >60   Alcohol abuse Maternal Grandfather    Stomach cancer Neg Hx    Rectal cancer Neg Hx    Esophageal cancer Neg Hx    Colon cancer Neg Hx    Breast cancer Neg Hx     Social History Social History   Tobacco Use   Smoking status: Former    Current packs/day: 0.00    Average packs/day: 0.5 packs/day for 10.0 years (5.0 ttl pk-yrs)    Types: Cigarettes    Start date: 10/21/1994    Quit date: 10/21/2004    Years since quitting: 19.4   Smokeless tobacco: Never  Vaping Use   Vaping status: Never Used  Substance Use Topics   Alcohol use: Yes    Comment: rare   Drug use: No     Allergies   Singulair [montelukast]   Review of Systems Review of Systems  Constitutional:  Negative for fever.  Gastrointestinal:  Positive for abdominal pain. Negative for nausea and vomiting.  Genitourinary:  Positive for frequency. Negative for dysuria,  hematuria and urgency.  Musculoskeletal:  Negative for back pain.     Physical Exam Triage Vital Signs ED Triage Vitals  Encounter Vitals Group     BP      Systolic BP Percentile      Diastolic BP Percentile      Pulse      Resp      Temp      Temp src      SpO2      Weight      Height      Head Circumference      Peak Flow  Pain Score      Pain Loc      Pain Education      Exclude from Growth Chart    No data found.  Updated Vital Signs BP 104/79 (BP Location: Left Arm)   Pulse 86   Temp 98.8 F (37.1 C) (Oral)   Resp 16   LMP 03/03/2024 (Approximate)   SpO2 98%   Visual Acuity Right Eye Distance:   Left Eye Distance:   Bilateral Distance:    Right Eye Near:   Left Eye Near:    Bilateral Near:     Physical Exam Vitals and nursing note reviewed.  Constitutional:      Appearance: Normal appearance. She is not ill-appearing.  HENT:     Head: Normocephalic and atraumatic.  Cardiovascular:     Rate and Rhythm: Normal rate and regular rhythm.     Pulses: Normal pulses.     Heart sounds: Normal heart sounds. No murmur heard.    No friction rub. No gallop.  Pulmonary:     Effort: Pulmonary effort is normal.     Breath sounds: Normal breath sounds. No wheezing, rhonchi or rales.  Abdominal:     Palpations: Abdomen is soft.     Tenderness: There is no abdominal tenderness. There is no right CVA tenderness, left CVA tenderness, guarding or rebound.     Comments: I did not palpate the bladder above the pubic symphysis.  Skin:    General: Skin is warm and dry.     Capillary Refill: Capillary refill takes less than 2 seconds.     Findings: No rash.  Neurological:     General: No focal deficit present.     Mental Status: She is alert and oriented to person, place, and time.      UC Treatments / Results  Labs (all labs ordered are listed, but only abnormal results are displayed) Labs Reviewed  URINALYSIS, W/ REFLEX TO CULTURE (INFECTION SUSPECTED) -  Abnormal; Notable for the following components:      Result Value   APPearance HAZY (*)    Specific Gravity, Urine >1.030 (*)    Hgb urine dipstick SMALL (*)    Protein, ur TRACE (*)    Leukocytes,Ua SMALL (*)    Bacteria, UA MANY (*)    All other components within normal limits    EKG   Radiology No results found.  Procedures Procedures (including critical care time)  Medications Ordered in UC Medications - No data to display  Initial Impression / Assessment and Plan / UC Course  I have reviewed the triage vital signs and the nursing notes.  Pertinent labs & imaging results that were available during my care of the patient were reviewed by me and considered in my medical decision making (see chart for details).   Patient is a pleasant 43 year old female presenting for evaluation of urinary symptoms as outlined in HPI above.  She has a history of UTIs but reports that this feels different than her typical UTI as there is no dysuria or urgency involved.  She feels like she is not emptying her bladder fully and she does endorse increased urinary frequency.  She reports having something similar occur in her early 64s though I cannot find any documentation of such in epic.  She reports that the treatment involved catheterization and bladder irrigation due to pockets of infection being present in her bladder.  On exam her abdomen is soft and nontender and I do not feel her  bladder present above the pubic symphysis with palpation.  She also has no CVA tenderness on exam.  I will order a urinalysis to assess for the presence of UTI.  If her urine is positive I will treat her for a UTI and refer her to urology for follow-up.  If there is no evidence of infection I will refer the patient to the emergency department for bladder scan and possible catheterization given that she has a feeling of incomplete emptying.  Urinalysis has hazy appearance with a high specific gravity, small hemoglobin,  trace protein, small leukocyte esterase.  Negative for nitrites, protein, or glucose.  Reflex microscopy shows skin contamination with 11-20 squamous epithelials, greater than 50 WBCs, many bacteria, and WBC clumps present.  I will order a urine culture and have the patient recollect given the skin contamination.  I will also discharge her home on Bactrim  DS twice daily with food for 5 days for treatment of her UTI along with Pyridium to help with the bladder pressure.  Additionally, I will refer her to urology for follow-up should her feelings of incomplete bladder emptying continue.   Final Clinical Impressions(s) / UC Diagnoses   Final diagnoses:  Lower urinary tract infectious disease     Discharge Instructions      Take the Bactrim  DS twice daily for 5 days with food for treatment of urinary tract infection.  Use the Pyridium every 8 hours as needed for urinary discomfort.  This will turn your urine a bright red-orange.  Increase your oral fluid intake so that you increase your urine production and or flushing your urinary system.  Take an over-the-counter probiotic, such as Culturelle-Align-Activia, 1 hour after each dose of antibiotic to prevent diarrhea or yeast infections from forming.  We will culture urine and change the antibiotics if necessary.  Return for reevaluation, or see your primary care provider, for any new or worsening symptoms.    ED Prescriptions     Medication Sig Dispense Auth. Provider   sulfamethoxazole -trimethoprim  (BACTRIM  DS) 800-160 MG tablet Take 1 tablet by mouth 2 (two) times daily for 5 days. 10 tablet Kent Pear, NP   phenazopyridine (PYRIDIUM) 200 MG tablet Take 1 tablet (200 mg total) by mouth 3 (three) times daily. 6 tablet Kent Pear, NP      PDMP not reviewed this encounter.   Kent Pear, NP 03/20/24 1015

## 2024-03-20 NOTE — Discharge Instructions (Addendum)
 Take the Bactrim DS twice daily for 5 days with food for treatment of urinary tract infection.  Use the Pyridium every 8 hours as needed for urinary discomfort.  This will turn your urine a bright red-orange.  Increase your oral fluid intake so that you increase your urine production and or flushing your urinary system.  Take an over-the-counter probiotic, such as Culturelle-Align-Activia, 1 hour after each dose of antibiotic to prevent diarrhea or yeast infections from forming.  We will culture urine and change the antibiotics if necessary.  Return for reevaluation, or see your primary care provider, for any new or worsening symptoms.

## 2024-03-20 NOTE — ED Triage Notes (Signed)
 Patient presents to [facility] for possible UTI. States bladder feels full. Hx of UTIs. Treating discomfort with Tylenol .

## 2024-03-21 LAB — URINE CULTURE: Special Requests: NORMAL

## 2024-03-23 ENCOUNTER — Ambulatory Visit (HOSPITAL_COMMUNITY): Payer: Self-pay

## 2024-03-25 ENCOUNTER — Ambulatory Visit (INDEPENDENT_AMBULATORY_CARE_PROVIDER_SITE_OTHER): Admitting: Urology

## 2024-03-25 ENCOUNTER — Encounter: Payer: Self-pay | Admitting: Urology

## 2024-03-25 VITALS — BP 106/75 | HR 82 | Ht 62.0 in | Wt 220.0 lb

## 2024-03-25 DIAGNOSIS — R350 Frequency of micturition: Secondary | ICD-10-CM

## 2024-03-25 DIAGNOSIS — R102 Pelvic and perineal pain: Secondary | ICD-10-CM | POA: Diagnosis not present

## 2024-03-25 DIAGNOSIS — K8020 Calculus of gallbladder without cholecystitis without obstruction: Secondary | ICD-10-CM

## 2024-03-25 LAB — URINALYSIS, COMPLETE
Bilirubin, UA: NEGATIVE
Glucose, UA: NEGATIVE
Ketones, UA: NEGATIVE
Leukocytes,UA: NEGATIVE
Nitrite, UA: NEGATIVE
Protein,UA: NEGATIVE
Specific Gravity, UA: 1.03 (ref 1.005–1.030)
Urobilinogen, Ur: 0.2 mg/dL (ref 0.2–1.0)
pH, UA: 6 (ref 5.0–7.5)

## 2024-03-25 LAB — MICROSCOPIC EXAMINATION

## 2024-03-25 LAB — BLADDER SCAN AMB NON-IMAGING

## 2024-03-25 NOTE — Progress Notes (Signed)
 I, [name], acting as a Neurosurgeon for Bridget Knapp, MD., have documented all relevant documentation on the behalf of Bridget Knapp, MD, as directed by Bridget Knapp, MD while in the presence of Bridget Knapp, MD.  Discussed the use of AI scribe software for clinical note transcription with the patient, who gave verbal consent to proceed.   03/25/2024 3:56 PM   [name] August 05, 1981 [id number]  Referring provider: Kent Pear, NP [address] [place],  [place] [address]  Chief Complaint  Patient presents with   New Patient (Initial Visit)    HPI: [name] is a 43 y.o. female presents in follow-up of a recent urgent care visit  Seen [place] [facility] 03/20/2024 with a 5 day history of urinary frequency, pelvic heaviness, and sensation of incomplete emptying. Urinalysis showed >50 WBCs, however there were 11-20 squamous epithelial cells consistent with vaginal contamination. Urine culture grew mixed flora. She was started on empiric antibiotics.  Her symptoms have improved but still complaining of some pelvic heaviness.  She had a similar episode in her 37s and was found to be in urinary retention and required a catheterization.  No urinary urgency, dysuira, or gross hematuria.   PMH: Past Medical History:  Diagnosis Date   Anemia    Iron Deficiency in the past   Asthma    in childhood   Bowen's disease    Complication of anesthesia    HARD TO WAKE UP SOMETIIMES   Dyspnea 04/2019   sob and coughing has increased of late   Endometriosis    dx'd in high school   Eosinophilic pneumonia (HCC) ~08/2007   per Dr. Verneta Gone s/p VATS and 1 year of prednisone    GERD (gastroesophageal reflux disease)    TUMS PRN   Headache    MIGRAINES without aura,seldom   History of abnormal Pap smear    History of chicken pox    Hypothyroidism    dx'd 2011   Pneumonia 2009/ 2020   EOSINOPHILIC PNEUMONIA   PONV (postoperative nausea and  vomiting)    x1   Seizures (HCC)    childhood-previously on Tegretol-been off meds for years with no seizures   Urinary tract bacterial infections     Surgical History: Past Surgical History:  Procedure Laterality Date   CESAREAN SECTION  2007   CHOLECYSTECTOMY N/A 10/27/2016   Procedure: LAPAROSCOPIC CHOLECYSTECTOMY;  Surgeon: Alben Alma, MD;  Location: [facility] ORS;  Service: General;  Laterality: N/A;   DIAGNOSTIC LAPAROSCOPY  2018   X2   FLEXIBLE BRONCHOSCOPY N/A 05/03/2019   Procedure: FLEXIBLE BRONCHOSCOPY;  Surgeon: Cordie Deters, MD;  Location: [facility] OR[facility];  Service: Pulmonary;  Laterality: N/A;   LUNG BIOPSY Right 2008   partial right lung resection.  was treated with steroids x 1 year   TONSILLECTOMY AND ADENOIDECTOMY     VIDEO BRONCHOSCOPY N/A 01/09/2020   Procedure: VIDEO BRONCHOSCOPY;  Surgeon: Norita Beauvais, MD;  Location: [facility] OR;  Service: Thoracic;  Laterality: N/A;    Home Medications:  Allergies as of 03/25/2024       Reactions   Singulair [montelukast]    Mood and GI intolerance.         Medication List        Accurate as of March 25, 2024  3:56 PM. If you have any questions, ask your nurse or doctor.          STOP taking these  medications    phenazopyridine 200 MG tablet Commonly known as: PYRIDIUM Stopped by: [name]       TAKE these medications    budesonide-formoterol 160-4.5 MCG/ACT inhaler Commonly known as: SYMBICORT Inhale 2 puffs into the lungs in the morning and at bedtime.   cyanocobalamin  1000 MCG/ML injection Commonly known as: VITAMIN B12 1000 mcg injected IM weekly x 4 doses then monthly thereafter   levothyroxine  150 MCG tablet Commonly known as: SYNTHROID  TAKE 1 TABLET BY MOUTH DAILY  BEFORE BREAKFAST   mycophenolate  500 MG tablet Commonly known as: CELLCEPT  2 tabs in the AM and 2 in the PM   sertraline  50 MG tablet Commonly known as: Zoloft  Take 1.5 tablets (75 mg total) by mouth daily.    sulfamethoxazole -trimethoprim  800-160 MG tablet Commonly known as: BACTRIM  DS Take 1 tablet by mouth 2 (two) times daily for 5 days.   Vitamin D  (Ergocalciferol ) 1.25 MG (50000 UNIT) Caps capsule Commonly known as: DRISDOL  Take 1 capsule (50,000 Units total) by mouth 2 (two) times a week.        Allergies:  Allergies  Allergen Reactions   Singulair [Montelukast]     Mood and GI intolerance.     Family History: Family History  Problem Relation Age of Onset   Mental illness Mother        bipolar   Cancer Father        h/o throat cancer- treated   Heart disease Maternal Grandfather        at ate >60   Alcohol abuse Maternal Grandfather    Stomach cancer Neg Hx    Rectal cancer Neg Hx    Esophageal cancer Neg Hx    Colon cancer Neg Hx    Breast cancer Neg Hx     Social History:  reports that she quit smoking about 19 years ago. Her smoking use included cigarettes. She started smoking about 29 years ago. She has a 5 pack-year smoking history. She has never used smokeless tobacco. She reports current alcohol use. She reports that she does not use drugs.   Physical Exam: BP 106/75   Pulse 82   Ht 5\' 2"  (1.575 m)   Wt 220 lb (99.8 kg)   LMP 03/03/2024 (Approximate)   BMI 40.24 kg/m   Constitutional:  Alert and oriented, No acute distress. HEENT: [place] AT Respiratory: Normal respiratory effort, no increased work of breathing. Psychiatric: Normal mood and affect.  Urinalysis Dipstick 1+ blood/microscopy negative   Assessment & Plan:    1. Urinary frequency Associated with pelvic heaviness Urinalysis today normal PVR 1 mL She was given samples of Gemtesa 75 mg daily to take for 2 weeks. Her symptoms are improving. Instructed to callback for persistent/worsening symptoms or as needed for recurrent symptoms.  I have reviewed the above documentation for accuracy and completeness, and I agree with the above.   Bridget Knapp, MD  [facility] [address] [place], [place] [address] [phone]

## 2024-07-04 ENCOUNTER — Other Ambulatory Visit: Payer: Self-pay | Admitting: Family Medicine

## 2024-07-19 ENCOUNTER — Other Ambulatory Visit: Payer: Self-pay | Admitting: Family Medicine

## 2024-08-15 ENCOUNTER — Other Ambulatory Visit: Payer: Self-pay | Admitting: Family Medicine

## 2024-08-15 DIAGNOSIS — F419 Anxiety disorder, unspecified: Secondary | ICD-10-CM

## 2024-08-16 NOTE — Telephone Encounter (Signed)
 E-scribed refill.  Per Dr Cleatus, pls schedule CPE and fasting labs for additional refills.

## 2024-08-17 ENCOUNTER — Other Ambulatory Visit: Payer: Self-pay | Admitting: Family Medicine

## 2024-08-17 DIAGNOSIS — E039 Hypothyroidism, unspecified: Secondary | ICD-10-CM

## 2024-08-18 NOTE — Telephone Encounter (Signed)
Lvm and sent My Chart message.

## 2024-08-18 NOTE — Telephone Encounter (Signed)
 E-scribed refill.  Pls schedule CPE and fasting labs for additional refills.
